# Patient Record
Sex: Male | Born: 1955 | Race: Black or African American | Hispanic: No | Marital: Married | Smoking: Former smoker
Health system: Southern US, Community
[De-identification: ages and names within clinical notes are randomized; demographics above are authoritative.]

## PROBLEM LIST (undated history)

## (undated) DIAGNOSIS — E119 Type 2 diabetes mellitus without complications: Secondary | ICD-10-CM

## (undated) DIAGNOSIS — C61 Malignant neoplasm of prostate: Secondary | ICD-10-CM

## (undated) DIAGNOSIS — I639 Cerebral infarction, unspecified: Secondary | ICD-10-CM

## (undated) DIAGNOSIS — I2510 Atherosclerotic heart disease of native coronary artery without angina pectoris: Secondary | ICD-10-CM

## (undated) DIAGNOSIS — E785 Hyperlipidemia, unspecified: Secondary | ICD-10-CM

## (undated) DIAGNOSIS — I10 Essential (primary) hypertension: Secondary | ICD-10-CM

## (undated) HISTORY — PX: PROSTATE BIOPSY: SHX241

## (undated) HISTORY — PX: CARDIAC CATHETERIZATION: SHX172

---

## 2019-01-13 ENCOUNTER — Encounter (HOSPITAL_COMMUNITY): Payer: Self-pay

## 2019-01-13 ENCOUNTER — Emergency Department (HOSPITAL_COMMUNITY): Payer: Medicare PPO

## 2019-01-13 ENCOUNTER — Inpatient Hospital Stay (HOSPITAL_COMMUNITY)
Admission: EM | Admit: 2019-01-13 | Discharge: 2019-01-16 | DRG: 065 | Disposition: A | Discharge status: Home / Self Care | Payer: Medicare PPO | Attending: Internal Medicine | Admitting: Internal Medicine

## 2019-01-13 DIAGNOSIS — E1151 Type 2 diabetes mellitus with diabetic peripheral angiopathy without gangrene: Secondary | ICD-10-CM | POA: Diagnosis present

## 2019-01-13 DIAGNOSIS — R29701 NIHSS score 1: Secondary | ICD-10-CM | POA: Diagnosis not present

## 2019-01-13 DIAGNOSIS — I4510 Unspecified right bundle-branch block: Secondary | ICD-10-CM | POA: Diagnosis present

## 2019-01-13 DIAGNOSIS — G8194 Hemiplegia, unspecified affecting left nondominant side: Secondary | ICD-10-CM | POA: Diagnosis present

## 2019-01-13 DIAGNOSIS — R29702 NIHSS score 2: Secondary | ICD-10-CM | POA: Diagnosis not present

## 2019-01-13 DIAGNOSIS — R739 Hyperglycemia, unspecified: Secondary | ICD-10-CM

## 2019-01-13 DIAGNOSIS — N179 Acute kidney failure, unspecified: Secondary | ICD-10-CM | POA: Diagnosis present

## 2019-01-13 DIAGNOSIS — Z79899 Other long term (current) drug therapy: Secondary | ICD-10-CM

## 2019-01-13 DIAGNOSIS — I639 Cerebral infarction, unspecified: Principal | ICD-10-CM | POA: Diagnosis present

## 2019-01-13 DIAGNOSIS — R4781 Slurred speech: Secondary | ICD-10-CM | POA: Diagnosis not present

## 2019-01-13 DIAGNOSIS — E1122 Type 2 diabetes mellitus with diabetic chronic kidney disease: Secondary | ICD-10-CM | POA: Diagnosis present

## 2019-01-13 DIAGNOSIS — G459 Transient cerebral ischemic attack, unspecified: Secondary | ICD-10-CM | POA: Diagnosis present

## 2019-01-13 DIAGNOSIS — E1165 Type 2 diabetes mellitus with hyperglycemia: Secondary | ICD-10-CM | POA: Diagnosis present

## 2019-01-13 DIAGNOSIS — N182 Chronic kidney disease, stage 2 (mild): Secondary | ICD-10-CM | POA: Diagnosis present

## 2019-01-13 DIAGNOSIS — Z20828 Contact with and (suspected) exposure to other viral communicable diseases: Secondary | ICD-10-CM | POA: Diagnosis present

## 2019-01-13 DIAGNOSIS — G319 Degenerative disease of nervous system, unspecified: Secondary | ICD-10-CM | POA: Diagnosis present

## 2019-01-13 DIAGNOSIS — R471 Dysarthria and anarthria: Secondary | ICD-10-CM | POA: Diagnosis present

## 2019-01-13 DIAGNOSIS — I129 Hypertensive chronic kidney disease with stage 1 through stage 4 chronic kidney disease, or unspecified chronic kidney disease: Secondary | ICD-10-CM | POA: Diagnosis present

## 2019-01-13 DIAGNOSIS — R001 Bradycardia, unspecified: Secondary | ICD-10-CM | POA: Diagnosis present

## 2019-01-13 DIAGNOSIS — N400 Enlarged prostate without lower urinary tract symptoms: Secondary | ICD-10-CM | POA: Diagnosis present

## 2019-01-13 DIAGNOSIS — I6350 Cerebral infarction due to unspecified occlusion or stenosis of unspecified cerebral artery: Secondary | ICD-10-CM

## 2019-01-13 DIAGNOSIS — E785 Hyperlipidemia, unspecified: Secondary | ICD-10-CM | POA: Diagnosis present

## 2019-01-13 DIAGNOSIS — Z7984 Long term (current) use of oral hypoglycemic drugs: Secondary | ICD-10-CM

## 2019-01-13 DIAGNOSIS — Z8673 Personal history of transient ischemic attack (TIA), and cerebral infarction without residual deficits: Secondary | ICD-10-CM

## 2019-01-13 HISTORY — DX: Essential (primary) hypertension: I10

## 2019-01-13 HISTORY — DX: Type 2 diabetes mellitus without complications: E11.9

## 2019-01-13 LAB — COMPREHENSIVE METABOLIC PANEL
ALT: 16 U/L (ref 0–44)
AST: 22 U/L (ref 15–41)
Albumin: 3.6 g/dL (ref 3.5–5.0)
Alkaline Phosphatase: 65 U/L (ref 38–126)
Anion gap: 9 (ref 5–15)
BUN: 14 mg/dL (ref 8–23)
CO2: 24 mmol/L (ref 22–32)
Calcium: 8.9 mg/dL (ref 8.9–10.3)
Chloride: 100 mmol/L (ref 98–111)
Creatinine, Ser: 1.42 mg/dL — ABNORMAL HIGH (ref 0.61–1.24)
GFR calc Af Amer: 60 mL/min — ABNORMAL LOW (ref >60)
GFR calc non Af Amer: 51 mL/min — ABNORMAL LOW (ref >60)
Glucose, Bld: 405 mg/dL — ABNORMAL HIGH (ref 70–99)
Potassium: 4.1 mmol/L (ref 3.5–5.1)
Sodium: 133 mmol/L — ABNORMAL LOW (ref 135–145)
Total Bilirubin: 1.1 mg/dL (ref 0.3–1.2)
Total Protein: 7 g/dL (ref 6.5–8.1)

## 2019-01-13 LAB — CBC
HCT: 38 % — ABNORMAL LOW (ref 39.0–52.0)
Hemoglobin: 13.3 g/dL (ref 13.0–17.0)
MCH: 31.2 pg (ref 26.0–34.0)
MCHC: 35 g/dL (ref 30.0–36.0)
MCV: 89.2 fL (ref 80.0–100.0)
Platelets: 176 10*3/uL (ref 150–400)
RBC: 4.26 MIL/uL (ref 4.22–5.81)
RDW: 12 % (ref 11.5–15.5)
WBC: 5.4 10*3/uL (ref 4.0–10.5)
nRBC: 0 % (ref 0.0–0.2)

## 2019-01-13 LAB — URINALYSIS, ROUTINE W REFLEX MICROSCOPIC
Bacteria, UA: NONE SEEN
Bilirubin Urine: NEGATIVE
Glucose, UA: >=500 mg/dL — AB
Hgb urine dipstick: NEGATIVE
Ketones, ur: NEGATIVE mg/dL
Leukocytes,Ua: NEGATIVE
Nitrite: NEGATIVE
Protein, ur: 30 mg/dL — AB
Specific Gravity, Urine: 1.028 (ref 1.005–1.030)
pH: 6 (ref 5.0–8.0)

## 2019-01-13 LAB — APTT: aPTT: 25 seconds (ref 24–36)

## 2019-01-13 LAB — DIFFERENTIAL
Abs Immature Granulocytes: 0.01 10*3/uL (ref 0.00–0.07)
Basophils Absolute: 0 10*3/uL (ref 0.0–0.1)
Basophils Relative: 0 %
Eosinophils Absolute: 0.1 10*3/uL (ref 0.0–0.5)
Eosinophils Relative: 1 %
Immature Granulocytes: 0 %
Lymphocytes Relative: 26 %
Lymphs Abs: 1.4 10*3/uL (ref 0.7–4.0)
Monocytes Absolute: 0.6 10*3/uL (ref 0.1–1.0)
Monocytes Relative: 11 %
Neutro Abs: 3.4 10*3/uL (ref 1.7–7.7)
Neutrophils Relative %: 62 %

## 2019-01-13 LAB — RAPID URINE DRUG SCREEN, HOSP PERFORMED
Amphetamines: NOT DETECTED
Barbiturates: NOT DETECTED
Benzodiazepines: NOT DETECTED
Cocaine: NOT DETECTED
Opiates: NOT DETECTED
Tetrahydrocannabinol: NOT DETECTED

## 2019-01-13 LAB — PROTIME-INR
INR: 0.9 (ref 0.8–1.2)
Prothrombin Time: 12.4 seconds (ref 11.4–15.2)

## 2019-01-13 LAB — ETHANOL: Alcohol, Ethyl (B): <10 mg/dL (ref <10)

## 2019-01-13 LAB — CBG MONITORING, ED
Glucose-Capillary: 399 mg/dL — ABNORMAL HIGH (ref 70–99)
Glucose-Capillary: 352 mg/dL — ABNORMAL HIGH (ref 70–99)

## 2019-01-13 IMAGING — CT CT HEAD W/O CM
3 series · 15 of 47 positions shown, 18 images · non-contrast
Comparison: None.

CLINICAL DATA: 63-year-old male with slurred speech and focal
neurologic deficit.

EXAM:
CT HEAD WITHOUT CONTRAST
TECHNIQUE: Contiguous axial images were obtained from the base of the skull
through the vertex without intravenous contrast.

[Series 2: head wo · axial · 0.44mm/px · z∈[+148,+273]mm · 9 of 31 slices shown, 12 images]
[im 3/31  brain]
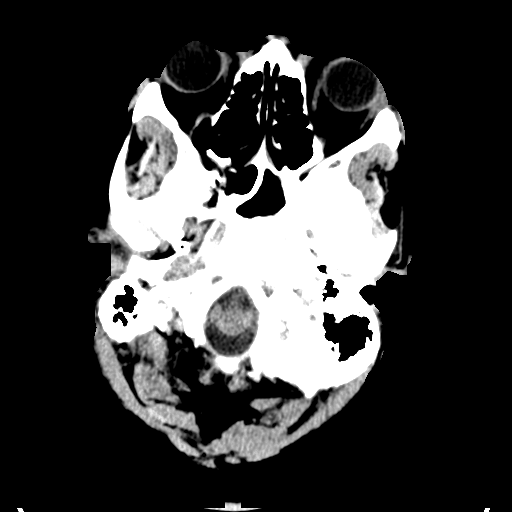
[im 3/31  bone]
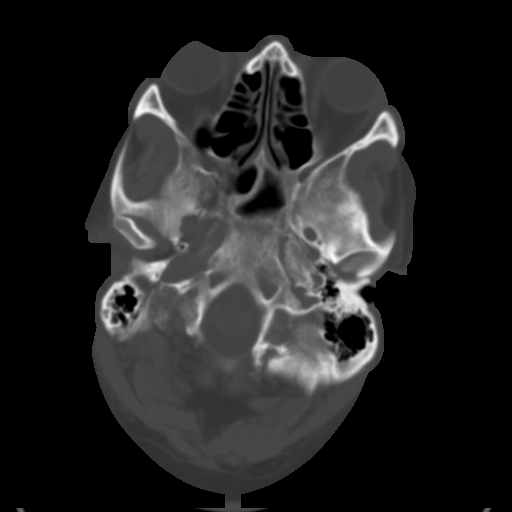
[im 6/31  brain]
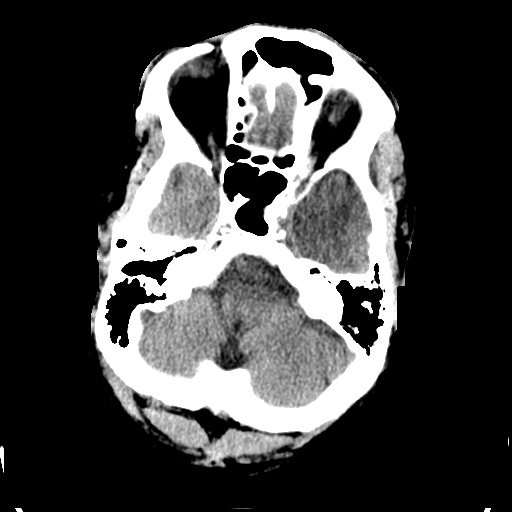
[im 9/31  brain]
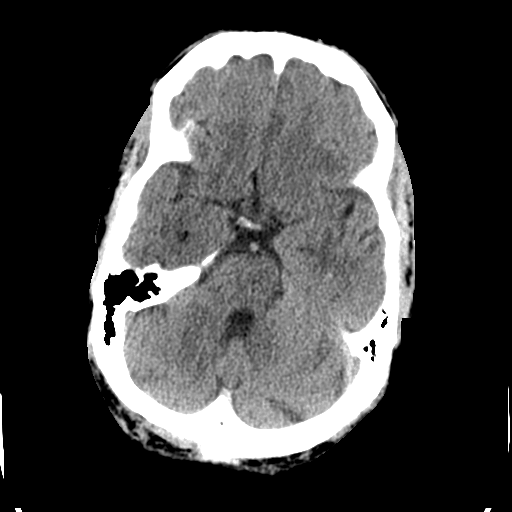
[im 12/31  brain]
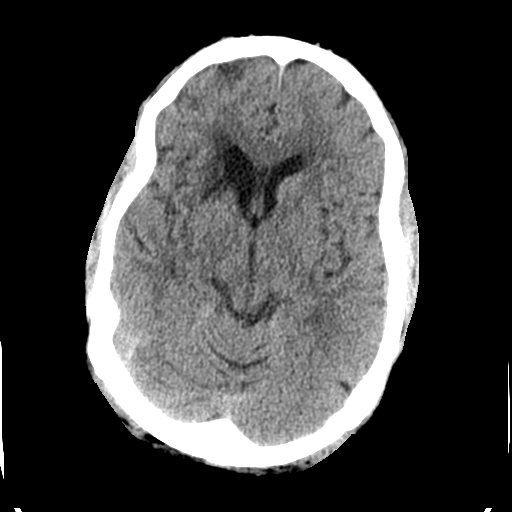
[im 16/31  brain]
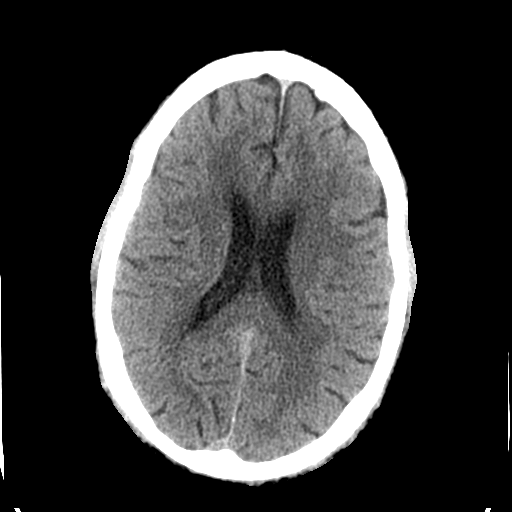
[im 16/31  bone]
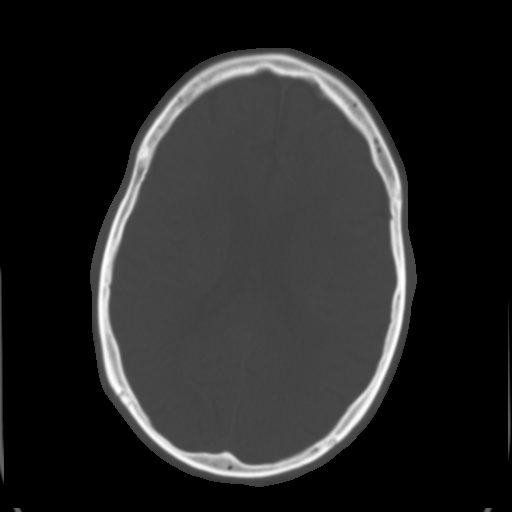
[im 19/31  brain]
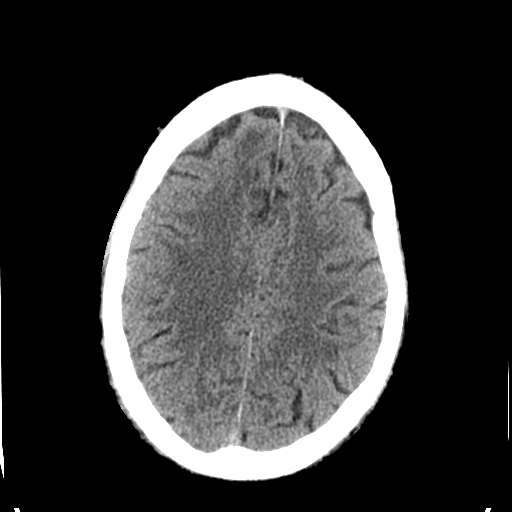
[im 22/31  brain]
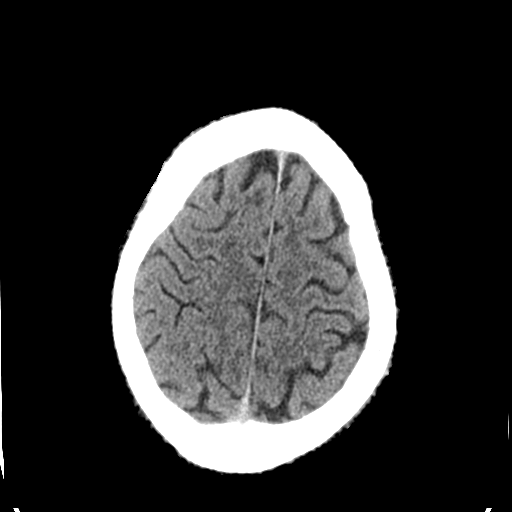
[im 25/31  brain]
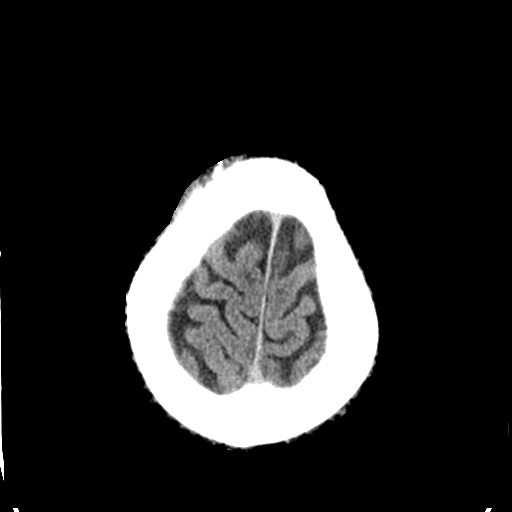
[im 28/31  brain]
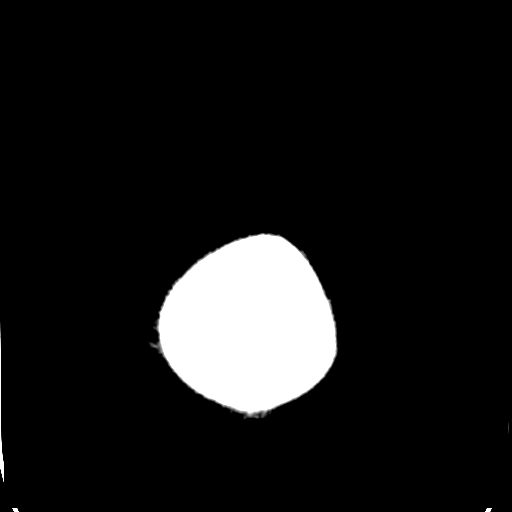
[im 28/31  bone]
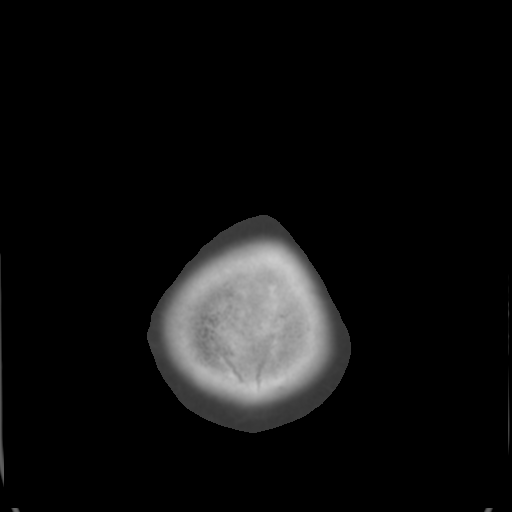

[Series 5: coronal soft tissue · coronal · 0.30mm/px · 3 of 72 slices shown]
[im 24/72  brain]
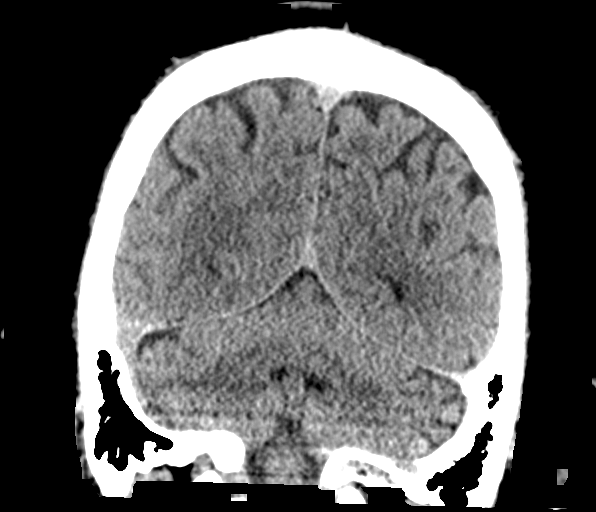
[im 32/72  brain]
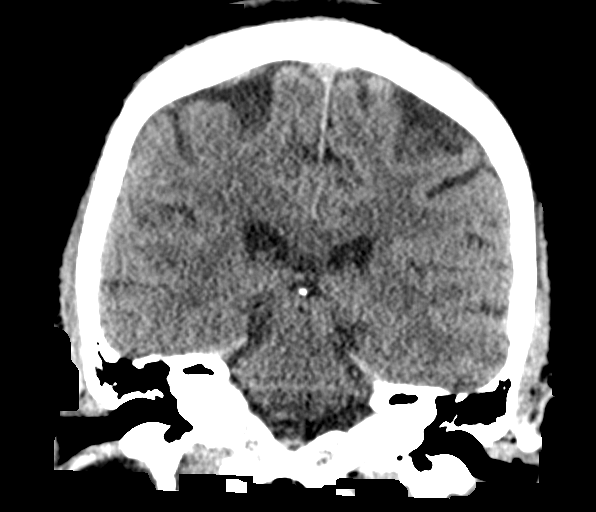
[im 40/72  brain]
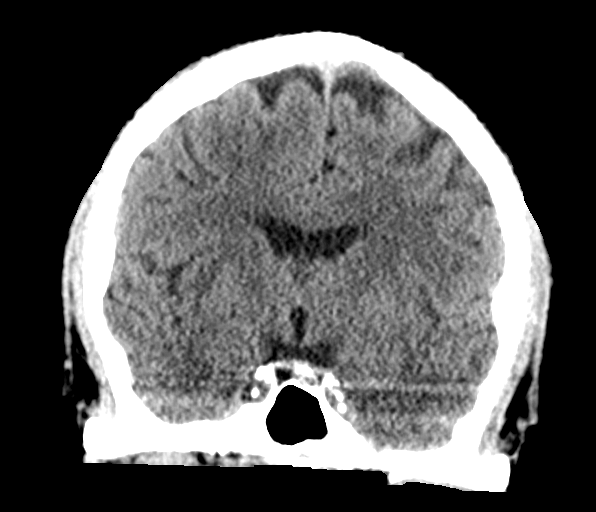

[Series 6: sagittal soft tissue · sagittal · 0.30mm/px · 3 of 60 slices shown]
[im 20/60  brain]
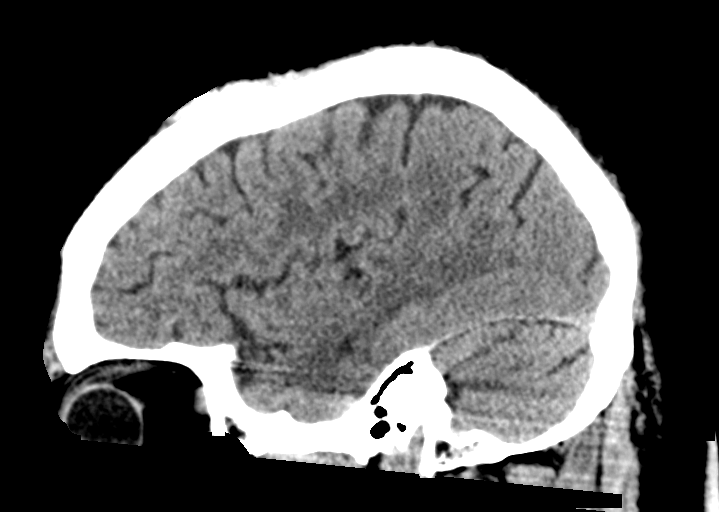
[im 30/60  brain]
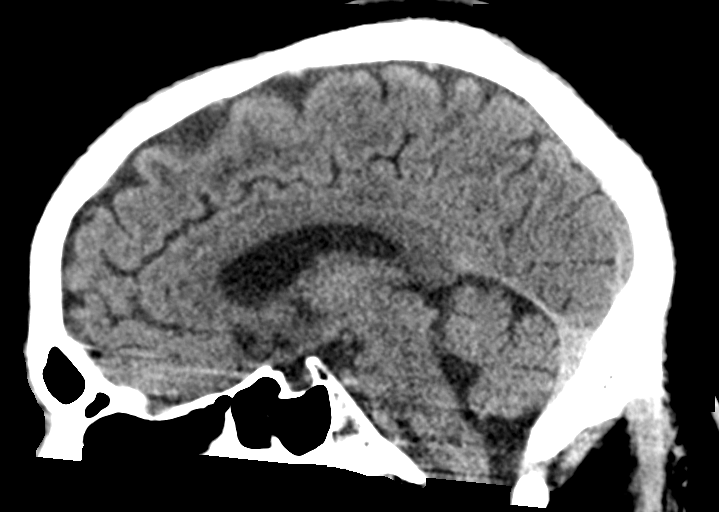
[im 40/60  brain]
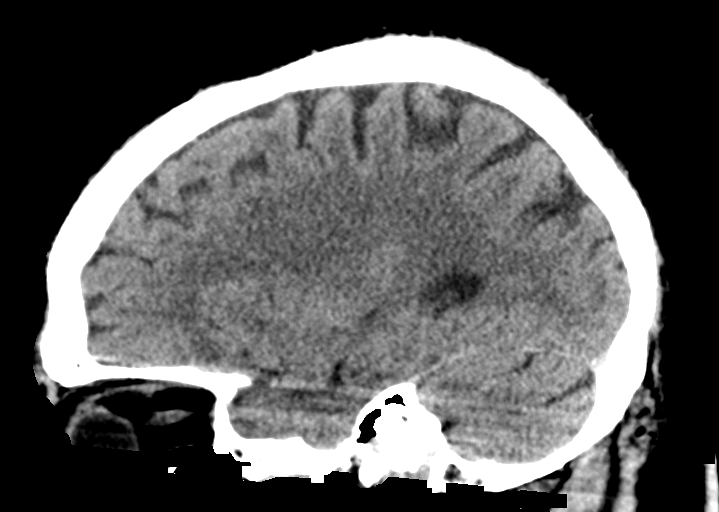

[15 of 47 positions shown; findings below may reference images not displayed]

FINDINGS: Brain: There is mild age-related atrophy and chronic microvascular
ischemic changes. Right basal ganglia old infarct noted. There is no
acute intracranial hemorrhage. No mass effect or midline shift. No
extra-axial fluid collection.

Vascular: No hyperdense vessel or unexpected calcification.

Skull: Normal. Negative for fracture or focal lesion.

Sinuses/Orbits: No acute finding.

Other: Right frontal scalp scarring.
IMPRESSION: 1. No acute intracranial hemorrhage.
2. Mild age-related atrophy and chronic microvascular ischemic
changes. Old right basal ganglia infarct.

## 2019-01-13 MED ORDER — SODIUM CHLORIDE 0.9 % IV SOLN
1000.0000 mL | INTRAVENOUS | Status: DC
  Administered 2019-01-13 – 2019-01-15 (×4): 1000 mL via INTRAVENOUS

## 2019-01-13 MED ORDER — SODIUM CHLORIDE 0.9 % IV BOLUS (SEPSIS)
1000.0000 mL | Freq: Once | INTRAVENOUS | Status: AC
  Administered 2019-01-13: 1000 mL via INTRAVENOUS

## 2019-01-13 MED ORDER — ACETAMINOPHEN 325 MG PO TABS
650.0000 mg | ORAL_TABLET | Freq: Once | ORAL | Status: DC
  Filled 2019-01-13: qty 2

## 2019-01-13 NOTE — ED Provider Notes (Signed)
[facility] DEPT Provider Note   CSN: [id number] Arrival date & time: 01/13/19  2017     History   Chief Complaint Chief Complaint  Patient presents with  . Hyperglycemia    HPI Jerry Moran is a 63 y.o. male.     HPI Patient presents to the ED for evaluation of an episode of slurred speech and lightheadedness.  Patient states the symptoms lasted for about an hour or so earlier this evening.  Patient denies any focal numbness or weakness.  He did not have any trouble with headache.  Patient called EMS and when they evaluated patient they did not see any neurologic abnormalities.  Patient was noted to have very elevated blood sugar.  Patient states he has taken some of his medications today but not all of them.  Patient states he is feeling better now and his symptoms have resolved. Past Medical History:  Diagnosis Date  . Diabetes mellitus without complication ([place])   . Hypertension     There are no active problems to display for this patient.   History reviewed. No pertinent surgical history.      Home Medications    Prior to Admission medications   Medication Sig Start Date End Date Taking? Authorizing Provider  amLODipine (NORVASC) 10 MG tablet Take 10 mg by mouth daily.   Yes [provider]  lisinopril (ZESTRIL) 20 MG tablet Take 20 mg by mouth daily.   Yes [provider]  metFORMIN (GLUCOPHAGE) 500 MG tablet Take 500 mg by mouth 4 (four) times daily.   Yes [provider]  tamsulosin (FLOMAX) 0.4 MG CAPS capsule Take 0.4 mg by mouth daily.   Yes [provider]    Family History History reviewed. No pertinent family history.  Social History Social History   Tobacco Use  . Smoking status: Never Smoker  . Smokeless tobacco: Never Used  Substance Use Topics  . Alcohol use: Never    Frequency: Never  . Drug use: Never     Allergies   Patient has no known allergies.   Review  of Systems Review of Systems  All other systems reviewed and are negative.    Physical Exam Updated Vital Signs BP (!) 146/92   Pulse 63   Temp 98.1 F (36.7 C) (Oral)   Resp 17   Ht 1.753 m (5\' 9" )   Wt 81.6 kg   SpO2 99%   BMI 26.58 kg/m   Physical Exam Vitals signs and nursing note reviewed.  Constitutional:      General: He is not in acute distress.    Appearance: He is well-developed.  HENT:     Head: Normocephalic and atraumatic.     Right Ear: External ear normal.     Left Ear: External ear normal.  Eyes:     General: No scleral icterus.       Right eye: No discharge.        Left eye: No discharge.     Conjunctiva/sclera: Conjunctivae normal.  Neck:     Musculoskeletal: Neck supple.     Trachea: No tracheal deviation.  Cardiovascular:     Rate and Rhythm: Normal rate and regular rhythm.  Pulmonary:     Effort: Pulmonary effort is normal. No respiratory distress.     Breath sounds: Normal breath sounds. No stridor. No wheezing or rales.  Abdominal:     General: Bowel sounds are normal. There is no distension.     Palpations: Abdomen  is soft.     Tenderness: There is no abdominal tenderness. There is no guarding or rebound.  Musculoskeletal:        General: No tenderness.  Skin:    General: Skin is warm and dry.     Findings: No rash.  Neurological:     Mental Status: He is alert and oriented to person, place, and time.     Cranial Nerves: No cranial nerve deficit (No facial droop, extraocular movements intact, tongue midline ).     Sensory: No sensory deficit.     Motor: No abnormal muscle tone or seizure activity.     Coordination: Coordination normal.     Comments: No pronator drift bilateral upper extrem, able to hold both legs off bed for 5 seconds, sensation intact in all extremities, no visual field cuts, no left or right sided neglect, normal finger-nose exam bilaterally, no nystagmus noted       ED Treatments / Results  Labs (all labs  ordered are listed, but only abnormal results are displayed) Labs Reviewed  CBC - Abnormal; Notable for the following components:      Result Value   HCT 38.0 (*)    All other components within normal limits  COMPREHENSIVE METABOLIC PANEL - Abnormal; Notable for the following components:   Sodium 133 (*)    Glucose, Bld 405 (*)    Creatinine, Ser 1.42 (*)    GFR calc non Af Amer 51 (*)    GFR calc Af Amer 60 (*)    All other components within normal limits  URINALYSIS, ROUTINE W REFLEX MICROSCOPIC - Abnormal; Notable for the following components:   Color, Urine STRAW (*)    Glucose, UA >=500 (*)    Protein, ur 30 (*)    All other components within normal limits  CBG MONITORING, ED - Abnormal; Notable for the following components:   Glucose-Capillary 399 (*)    All other components within normal limits  CBG MONITORING, ED - Abnormal; Notable for the following components:   Glucose-Capillary 352 (*)    All other components within normal limits  SARS CORONAVIRUS 2 (TAT 6-24 HRS)  ETHANOL  PROTIME-INR  APTT  DIFFERENTIAL  RAPID URINE DRUG SCREEN, HOSP PERFORMED    EKG EKG Interpretation  Date/Time:  Monday January 13 2019 21:16:09 EDT Ventricular Rate:  61 PR Interval:    QRS Duration: 127 QT Interval:  422 QTC Calculation: 426 R Axis:   82 Text Interpretation:  Sinus rhythm Right bundle branch block No old tracing to compare Confirmed by Dorie Rank [phone]) on 01/13/2019 10:04:54 PM   Radiology Ct Head Wo Contrast  Result Date: 01/13/2019 CLINICAL DATA:  63 year old male with slurred speech and focal neurologic deficit. EXAM: CT HEAD WITHOUT CONTRAST TECHNIQUE: Contiguous axial images were obtained from the base of the skull through the vertex without intravenous contrast. COMPARISON:  None. FINDINGS: Brain: There is mild age-related atrophy and chronic microvascular ischemic changes. 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 There is no acute intracranial hemorrhage. No  mass effect or midline shift. No extra-axial fluid collection. Vascular: No hyperdense vessel or unexpected calcification. Skull: Normal. Negative for fracture or focal lesion. Sinuses/Orbits: No acute finding. Other: Right frontal scalp scarring. IMPRESSION: 1. No acute intracranial hemorrhage. 2. Mild age-related atrophy and chronic microvascular ischemic changes. Old right basal ganglia infarct. Electronically Signed   By: Anner Crete M.D.   On: 01/13/2019 21:25    Procedures Procedures (including critical care time)  Medications Ordered in  ED Medications  sodium chloride 0.9 % bolus 1,000 mL (0 mLs Intravenous Stopped 01/13/19 2231)    Followed by  0.9 %  sodium chloride infusion (1,000 mLs Intravenous New Bag/Given 01/13/19 2234)  acetaminophen (TYLENOL) tablet 650 mg (has no administration in time range)     Initial Impression / Assessment and Plan / ED Course  I have reviewed the triage vital signs and the nursing notes.  Pertinent labs & imaging results that were available during my care of the patient were reviewed by me and considered in my medical decision making (see chart for details).  Clinical Course as of Jan 13 2332  Mon Jan 13, 2019  2256 Discussed findings with patient and wife.  Wife does not feel speech is quite  back to normal.  Will admit, consult   [JK]  2315 D/w Dr Leonel Ramsay, pt will need MRI.  Not available at [facility] at this time of night.  [facility] need [facility] to [facility]   [JK]    Clinical Course User Index [JK] Dorie Rank, MD   Patient presented to the ED for an episode of difficulty speaking.  Symptoms resolved prior to arrival.  On my exam the patient has no focal neurologic abnormalities.  Blood sugar is elevated however I do not think it severe enough to cause difficulty with his speech.  CT scan shows old findings but no acute stroke.  ABCD2 =5.  Moderate risk score.  WIll consult for admission, TIA workup.  Final Clinical Impressions(s) / ED  Diagnoses   Final diagnoses:  TIA (transient ischemic attack)  Hyperglycemia    ED Discharge Orders    None       Dorie Rank, MD 01/13/19 2337

## 2019-01-13 NOTE — ED Notes (Signed)
Patient transported to CT via stretcher.

## 2019-01-13 NOTE — ED Notes (Signed)
Pt ambulated to the bathroom without assistance. Gait steady. No complaints of dizziness or shortness of breath

## 2019-01-13 NOTE — ED Triage Notes (Signed)
EMS was called by pt's wife for slurred speech, when EMS arrived he was A&O x 4, no pain, no slurred speech, no drift and no facial droop Pt is noncompliant with his diabetic meds Pt is also hypertensive

## 2019-01-14 ENCOUNTER — Inpatient Hospital Stay (HOSPITAL_COMMUNITY): Payer: Medicare PPO

## 2019-01-14 ENCOUNTER — Other Ambulatory Visit: Payer: Self-pay

## 2019-01-14 ENCOUNTER — Observation Stay (HOSPITAL_COMMUNITY): Payer: Medicare PPO

## 2019-01-14 DIAGNOSIS — E1169 Type 2 diabetes mellitus with other specified complication: Secondary | ICD-10-CM

## 2019-01-14 DIAGNOSIS — N182 Chronic kidney disease, stage 2 (mild): Secondary | ICD-10-CM | POA: Diagnosis present

## 2019-01-14 DIAGNOSIS — E785 Hyperlipidemia, unspecified: Secondary | ICD-10-CM | POA: Diagnosis present

## 2019-01-14 DIAGNOSIS — R29702 NIHSS score 2: Secondary | ICD-10-CM | POA: Diagnosis not present

## 2019-01-14 DIAGNOSIS — E1151 Type 2 diabetes mellitus with diabetic peripheral angiopathy without gangrene: Secondary | ICD-10-CM | POA: Diagnosis present

## 2019-01-14 DIAGNOSIS — Z79899 Other long term (current) drug therapy: Secondary | ICD-10-CM | POA: Diagnosis not present

## 2019-01-14 DIAGNOSIS — I639 Cerebral infarction, unspecified: Secondary | ICD-10-CM | POA: Diagnosis present

## 2019-01-14 DIAGNOSIS — G459 Transient cerebral ischemic attack, unspecified: Secondary | ICD-10-CM | POA: Diagnosis not present

## 2019-01-14 DIAGNOSIS — N179 Acute kidney failure, unspecified: Secondary | ICD-10-CM | POA: Diagnosis present

## 2019-01-14 DIAGNOSIS — G319 Degenerative disease of nervous system, unspecified: Secondary | ICD-10-CM | POA: Diagnosis present

## 2019-01-14 DIAGNOSIS — Z20828 Contact with and (suspected) exposure to other viral communicable diseases: Secondary | ICD-10-CM | POA: Diagnosis present

## 2019-01-14 DIAGNOSIS — I4510 Unspecified right bundle-branch block: Secondary | ICD-10-CM | POA: Diagnosis present

## 2019-01-14 DIAGNOSIS — R471 Dysarthria and anarthria: Secondary | ICD-10-CM | POA: Diagnosis present

## 2019-01-14 DIAGNOSIS — R001 Bradycardia, unspecified: Secondary | ICD-10-CM

## 2019-01-14 DIAGNOSIS — E1165 Type 2 diabetes mellitus with hyperglycemia: Secondary | ICD-10-CM | POA: Diagnosis present

## 2019-01-14 DIAGNOSIS — R4781 Slurred speech: Secondary | ICD-10-CM | POA: Diagnosis present

## 2019-01-14 DIAGNOSIS — E1122 Type 2 diabetes mellitus with diabetic chronic kidney disease: Secondary | ICD-10-CM | POA: Diagnosis present

## 2019-01-14 DIAGNOSIS — G8194 Hemiplegia, unspecified affecting left nondominant side: Secondary | ICD-10-CM | POA: Diagnosis present

## 2019-01-14 DIAGNOSIS — N400 Enlarged prostate without lower urinary tract symptoms: Secondary | ICD-10-CM | POA: Diagnosis present

## 2019-01-14 DIAGNOSIS — Z7984 Long term (current) use of oral hypoglycemic drugs: Secondary | ICD-10-CM | POA: Diagnosis not present

## 2019-01-14 DIAGNOSIS — R29701 NIHSS score 1: Secondary | ICD-10-CM | POA: Diagnosis not present

## 2019-01-14 DIAGNOSIS — Z8673 Personal history of transient ischemic attack (TIA), and cerebral infarction without residual deficits: Secondary | ICD-10-CM | POA: Diagnosis not present

## 2019-01-14 DIAGNOSIS — I129 Hypertensive chronic kidney disease with stage 1 through stage 4 chronic kidney disease, or unspecified chronic kidney disease: Secondary | ICD-10-CM | POA: Diagnosis present

## 2019-01-14 LAB — HIV ANTIBODY (ROUTINE TESTING W REFLEX): HIV Screen 4th Generation wRfx: NONREACTIVE

## 2019-01-14 LAB — LIPID PANEL
Cholesterol: 252 mg/dL — ABNORMAL HIGH (ref 0–200)
HDL: 35 mg/dL — ABNORMAL LOW (ref >40)
LDL Cholesterol: 165 mg/dL — ABNORMAL HIGH (ref 0–99)
Total CHOL/HDL Ratio: 7.2 RATIO
Triglycerides: 258 mg/dL — ABNORMAL HIGH (ref <150)
VLDL: 52 mg/dL — ABNORMAL HIGH (ref 0–40)

## 2019-01-14 LAB — GLUCOSE, CAPILLARY: Glucose-Capillary: 280 mg/dL — ABNORMAL HIGH (ref 70–99)

## 2019-01-14 LAB — ECHOCARDIOGRAM COMPLETE
Height: 69 in
Weight: 2880 oz

## 2019-01-14 LAB — CBG MONITORING, ED
Glucose-Capillary: 213 mg/dL — ABNORMAL HIGH (ref 70–99)
Glucose-Capillary: 196 mg/dL — ABNORMAL HIGH (ref 70–99)

## 2019-01-14 LAB — SARS CORONAVIRUS 2 BY RT PCR (HOSPITAL ORDER, PERFORMED IN ~~LOC~~ HOSPITAL LAB): SARS Coronavirus 2: NEGATIVE

## 2019-01-14 LAB — HEMOGLOBIN A1C
Hgb A1c MFr Bld: 11.5 % — ABNORMAL HIGH (ref 4.8–5.6)
Mean Plasma Glucose: 283.35 mg/dL

## 2019-01-14 IMAGING — MR MR MRA HEAD W/O CM
13 of 15 series · 30 of 48 positions shown · non-contrast
Comparison: Head CT [DATE]

CLINICAL DATA: History of hypertension and diabetes. Acute
presentation with slurred speech and dizziness.



[Series 3: DWI · axial · 3.0mm · 1.09mm/px · z∈[-25,+113]mm · 5 of 94 slices shown (1 of 6)]
[im 1/94]
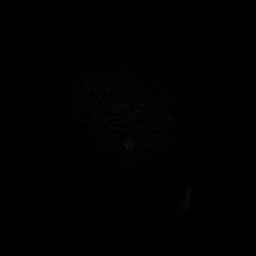
[im 24/94]
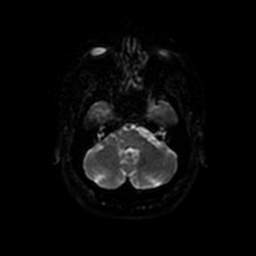
[im 47/94]
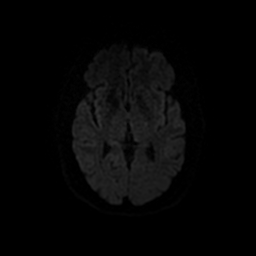
[im 70/94]
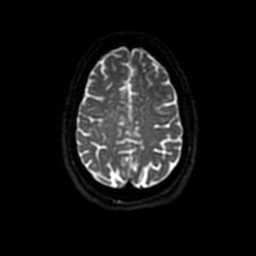
[im 94/94]
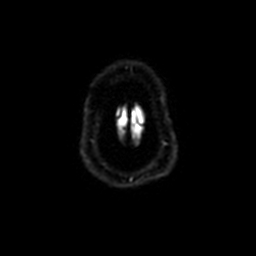

[Series 4: (id) mt fs · axial · 1.0mm · 0.47mm/px · 1 of 230 slices shown]
[im 1/230]
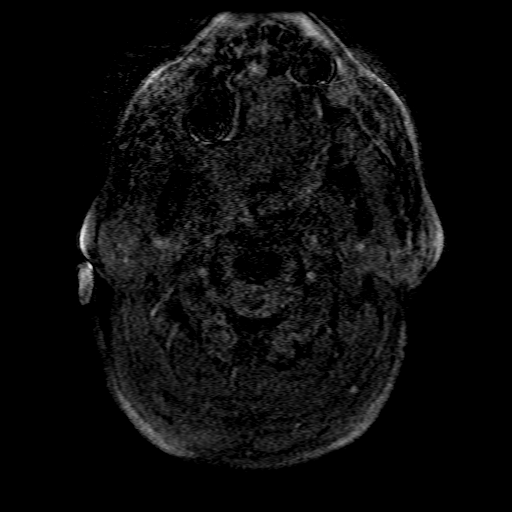

[Series 5: DWI · axial · 2.0mm · 1.09mm/px · z∈[-39,+29]mm · 3 of 70 slices shown (2 of 6)]
[im 1/70]
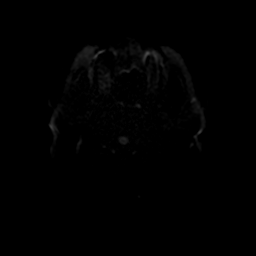
[im 35/70]
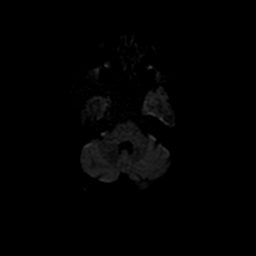
[im 70/70]
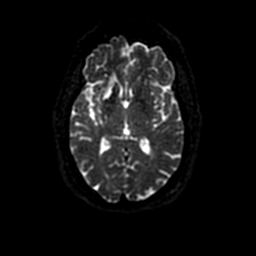

[Series 6: T2 · axial · 5.0mm · 0.43mm/px · 1 of 21 slices shown (1 of 3)]
[im 1/21]
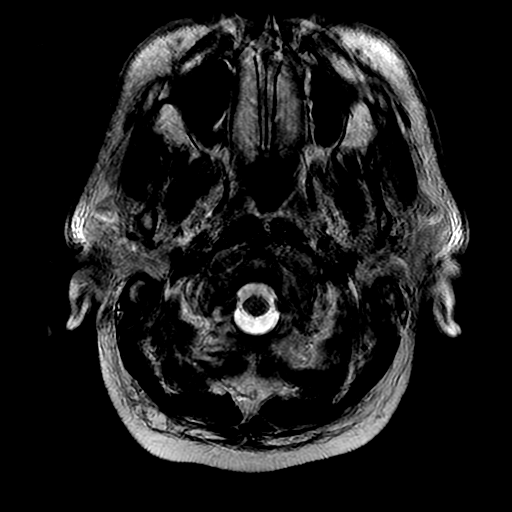

[Series 8: T1 · axial · 1.0mm · 0.47mm/px · z∈[-12,+124]mm · 6 of 138 slices shown (1 of 2)]
[im 1/138]
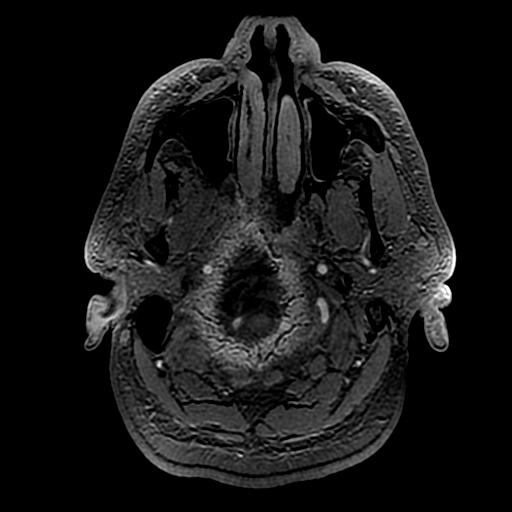
[im 28/138]
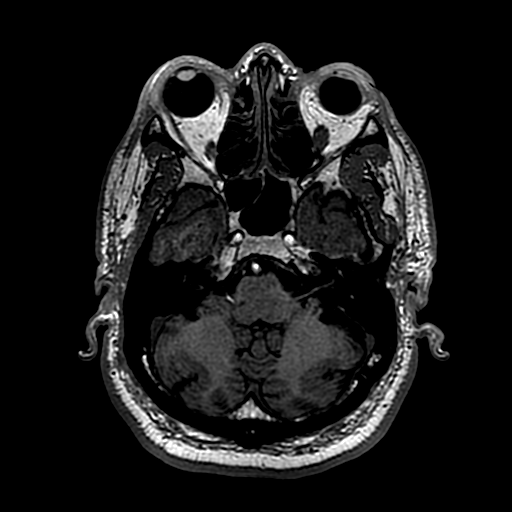
[im 55/138]
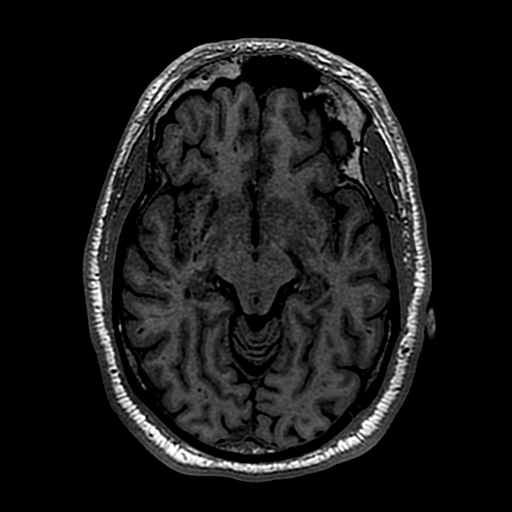
[im 83/138]
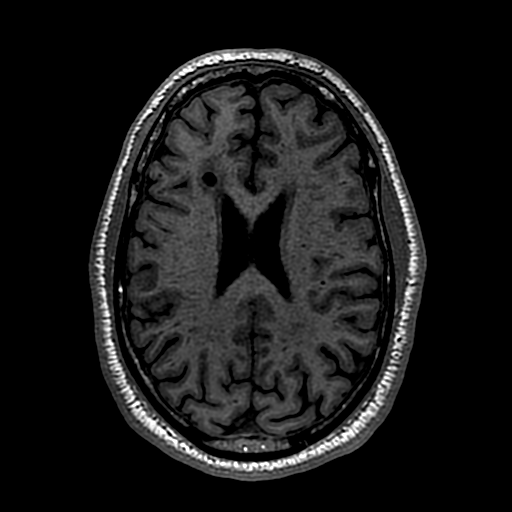
[im 110/138]
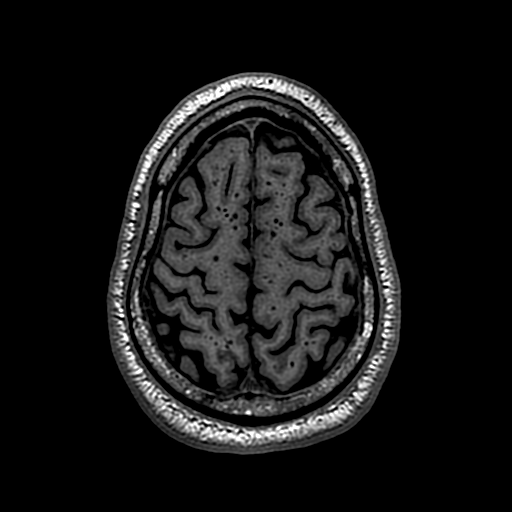
[im 138/138]
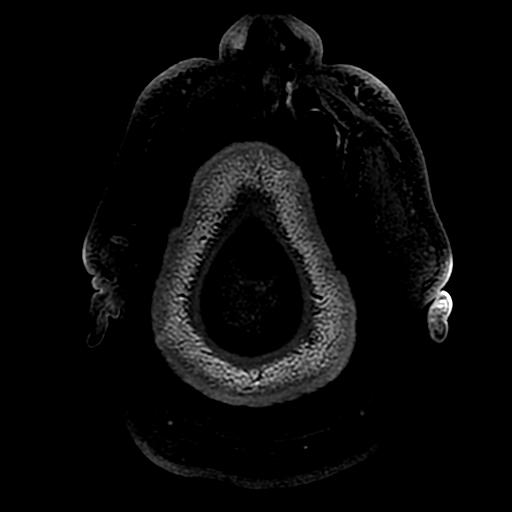

[Series 9: FLAIR · axial · 3.0mm · 0.43mm/px · 1 of 25 slices shown]
[im 1/25]
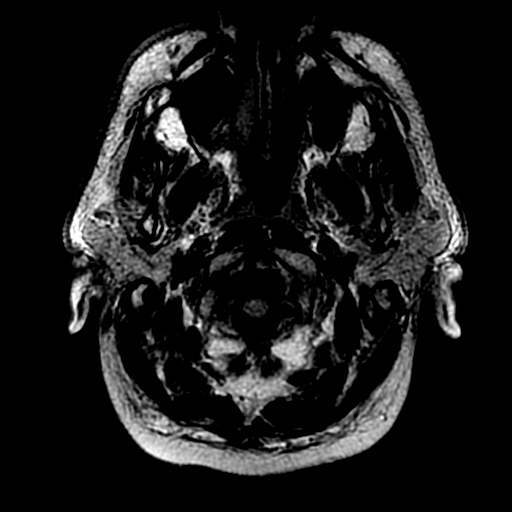

[Series 10: T2 · axial · 5.0mm · 0.43mm/px · 1 of 24 slices shown (2 of 3)]
[im 1/24]
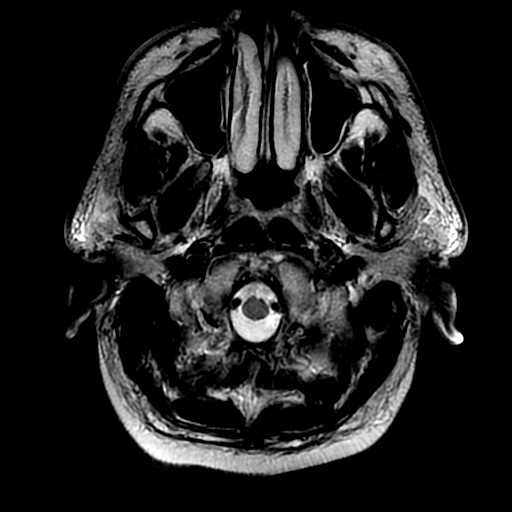

[Series 11: T1 · sagittal · 5.0mm · 0.47mm/px · 1 of 20 slices shown (2 of 2)]
[im 1/20]
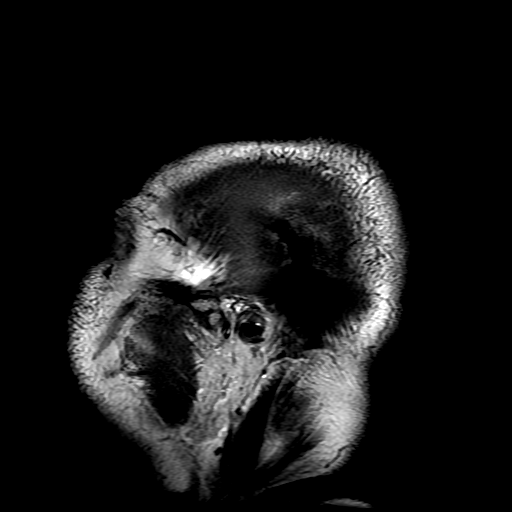

[Series 12: DWI · coronal · 4.0mm · 1.09mm/px · 4 of 84 slices shown (3 of 6)]
[im 1/84]
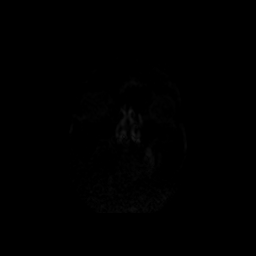
[im 28/84]
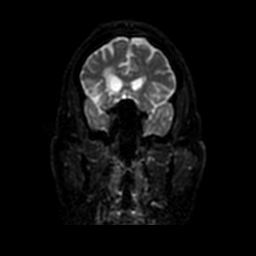
[im 56/84]
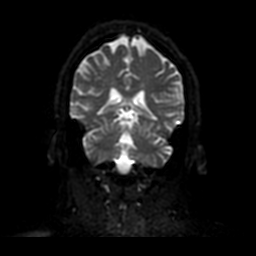
[im 84/84]
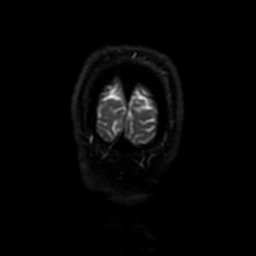

[Series 13: T2 · coronal · 5.0mm · 0.45mm/px · 1 of 26 slices shown (3 of 3)]
[im 1/26]
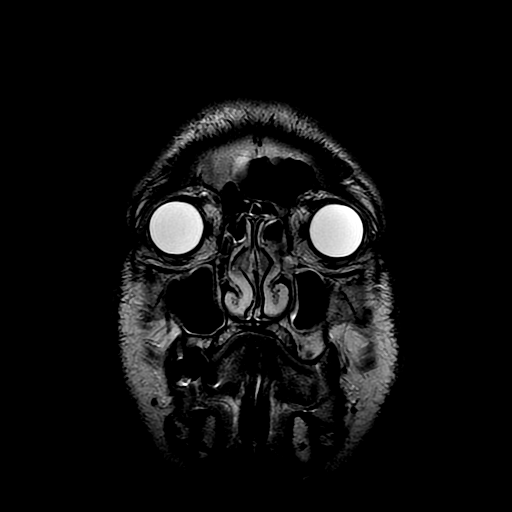

[Series 300: DWI · axial · 3.0mm · 1.09mm/px · z∈[-25,+113]mm · 2 of 47 slices shown (4 of 6)]
[im 1/47]
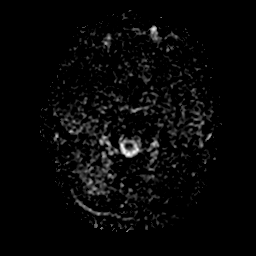
[im 47/47]
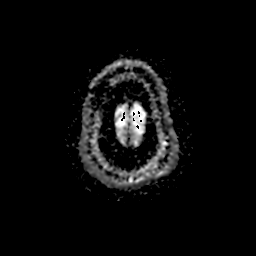

[Series 500: DWI · axial · 2.0mm · 1.09mm/px · z∈[-39,+29]mm · 2 of 35 slices shown (5 of 6)]
[im 1/35]
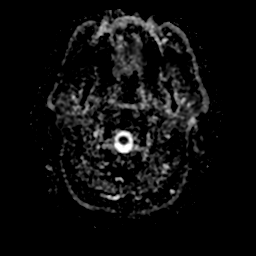
[im 35/35]
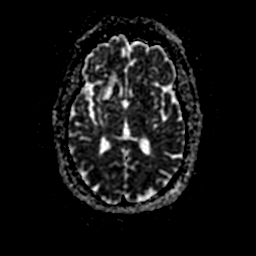

[Series 1200: DWI · coronal · 4.0mm · 1.09mm/px · 2 of 42 slices shown (6 of 6)]
[im 1/42]
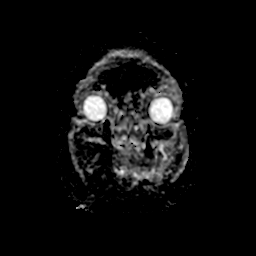
[im 42/42]
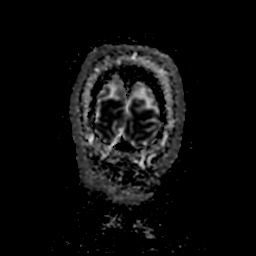

[30 of 48 positions shown; findings below may reference images not displayed]

FINDINGS: MRI HEAD FINDINGS

Brain: Diffusion imaging shows a 9 mm acute infarction within the
left para median pons. No other acute or subacute infarction.
Brainstem otherwise shows an old small vessel infarction in the
right para median pons. No focal cerebellar insult. Old small vessel
infarction in the right thalamus. Cerebral hemispheres show old
infarction in the right basal ganglia and in the white matter
adjacent to the frontal horn of the right lateral ventricle. There
are moderate chronic small-vessel ischemic changes diffusely
affecting the hemispheric deep white matter. No large vessel
territory infarction. No mass lesion, hemorrhage, hydrocephalus or
extra-axial collection.

Vascular: Major vessels at the base of the brain show flow.

Skull and upper cervical spine: Negative

Sinuses/Orbits: Clear/normal

Other: None

MRA HEAD FINDINGS

Both internal carotid arteries are patent through the skull base and
siphon regions. No flow limiting siphon stenosis. The anterior and
middle cerebral vessels are patent. There is a severe focal stenosis
1 of the 2 proximal left M2 branches. Distal vessels show some
atherosclerotic irregularity.

Both vertebral arteries are widely patent to the basilar. The
basilar artery shows atherosclerotic irregularity but no flow
limiting stenosis. Superior cerebellar and posterior cerebral
arteries show flow. Pronounced atherosclerotic irregularity of the
PCA branches, including a 50% or greater stenosis at the right P1 P2
junction.

MRA NECK FINDINGS

Antegrade flow in both common carotid arteries to the bifurcation
regions. Allowing for limitations of the noncontrast technique, both
carotid bifurcations appear normal. No suspected stenosis. Antegrade
flow in both vertebral arteries which are approximately equal in
size.
IMPRESSION: Acute 9 mm infarction affecting the left para median pons.

Old small vessel ischemic changes of the pons, right thalamus, right
basal ganglia and throughout the cerebral hemispheric white matter.

Negative noncontrast MR angiography of the neck vessels. No evidence
of carotid bifurcation disease. Antegrade flow in both vertebral
arteries.

No intracranial large or medium vessel occlusion. Severe stenosis of
the origin of 1 the 2 left M2 MCA branches. Moderate to severe
stenosis of the right posterior cerebral artery at the P1 P2
junction. Atherosclerotic irregularity of the more distal branch
vessels diffusely.

## 2019-01-14 IMAGING — MR MR MRA NECK W/O CM
22 of 29 series · 29 of 48 positions shown · non-contrast
Comparison: Head CT [DATE]

CLINICAL DATA: History of hypertension and diabetes. Acute
presentation with slurred speech and dizziness.



[Series 3: DWI · axial · 3.0mm · 1.09mm/px · 1 of 94 slices shown (1 of 12)]
[im 1/94]
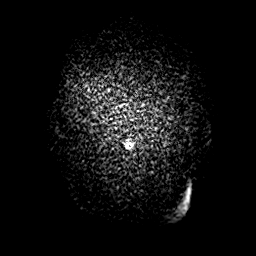

[Series 3: DWI · axial · 3.0mm · 1.09mm/px · 1 of 94 slices shown (2 of 12)]
[im 1/94]
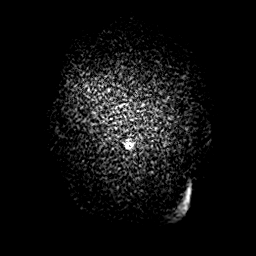

[Series 5: DWI · axial · 2.0mm · 1.09mm/px · z∈[-39,+29]mm · 2 of 70 slices shown (3 of 12)]
[im 1/70]
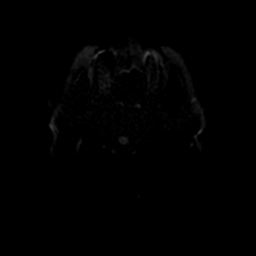
[im 70/70]
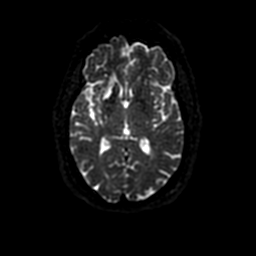

[Series 5: DWI · axial · 2.0mm · 1.09mm/px · z∈[-39,+29]mm · 2 of 70 slices shown (4 of 12)]
[im 1/70]
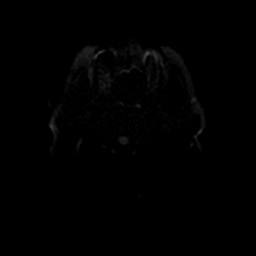
[im 70/70]
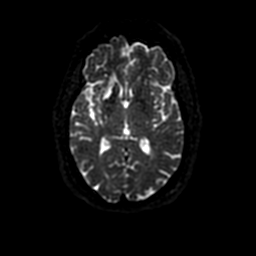

[Series 6: T2 · axial · 5.0mm · 0.43mm/px · 1 of 21 slices shown (1 of 6)]
[im 1/21]
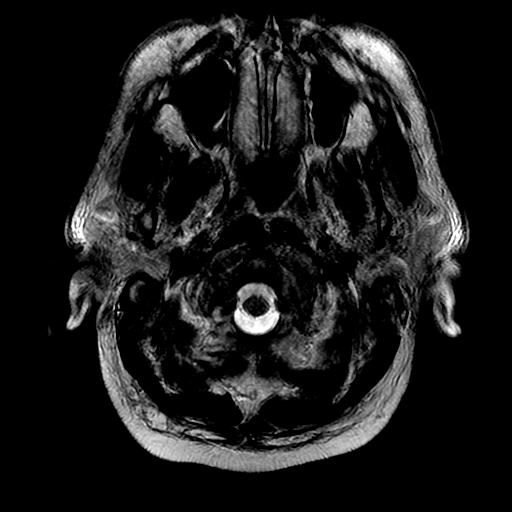

[Series 6: T2 · axial · 5.0mm · 0.43mm/px · 1 of 21 slices shown (2 of 6)]
[im 1/21]
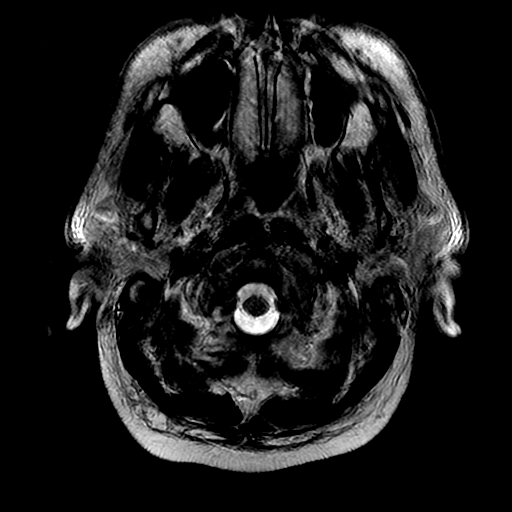

[Series 8: T1 · axial · 1.0mm · 0.47mm/px · z∈[-12,+124]mm · 3 of 138 slices shown (1 of 2)]
[im 1/138]
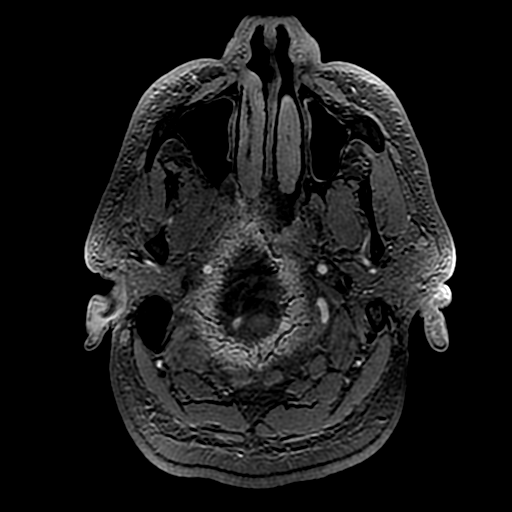
[im 69/138]
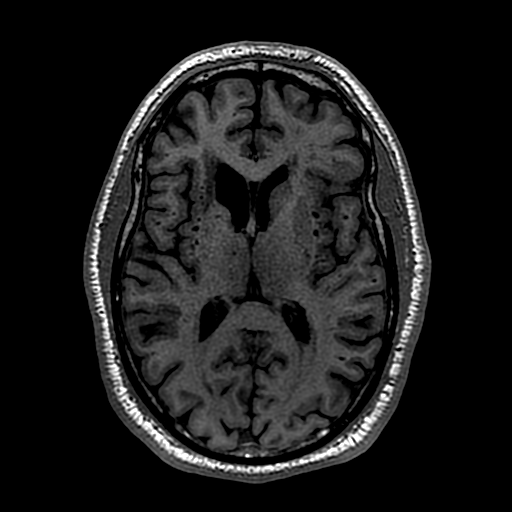
[im 138/138]
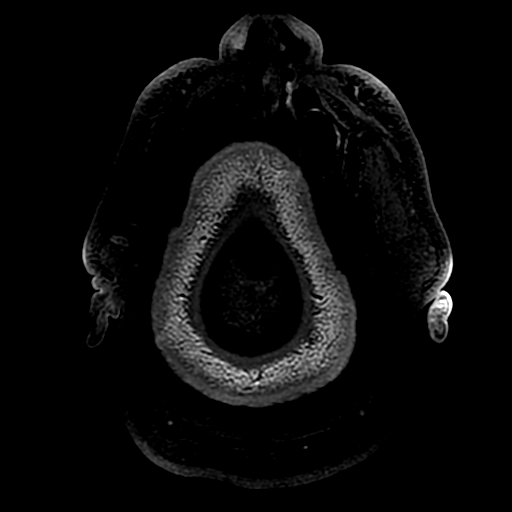

[Series 8: T1 · axial · 1.0mm · 0.47mm/px · z∈[-12,+55]mm · 2 of 138 slices shown (2 of 2)]
[im 1/138]
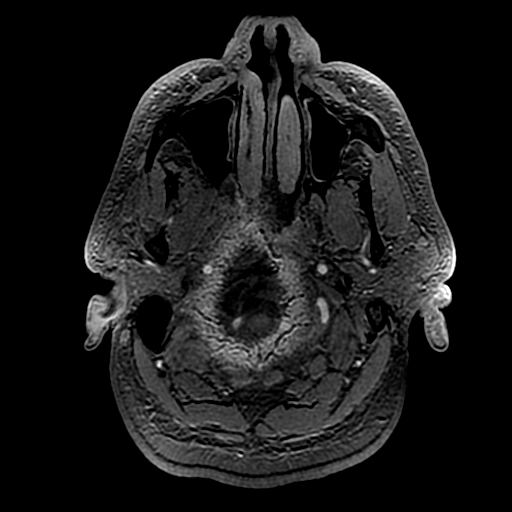
[im 69/138]
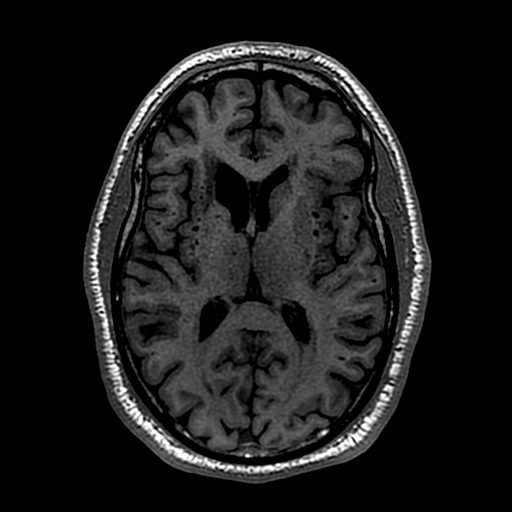

[Series 9: FLAIR · axial · 3.0mm · 0.43mm/px · 1 of 25 slices shown (1 of 2)]
[im 1/25]
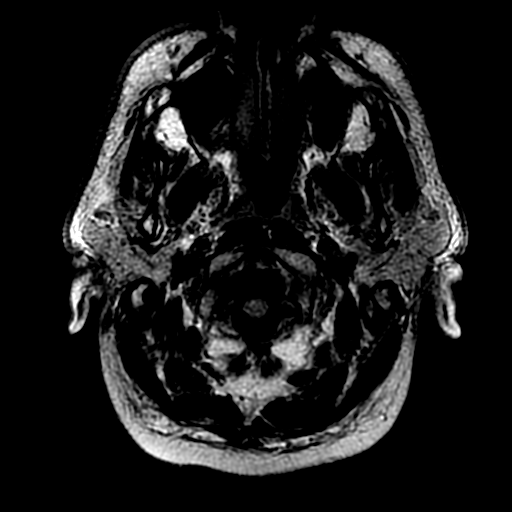

[Series 9: FLAIR · axial · 3.0mm · 0.43mm/px · 1 of 25 slices shown (2 of 2)]
[im 1/25]
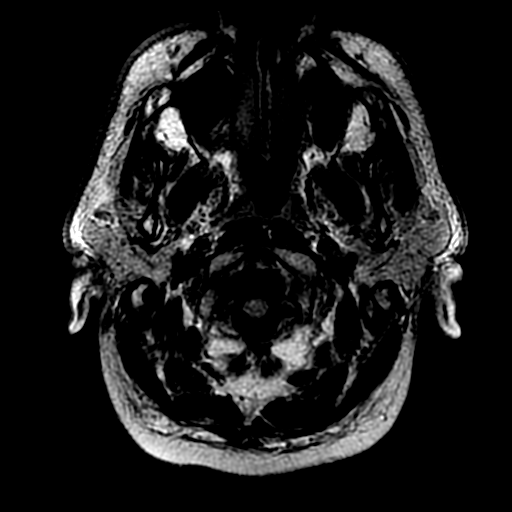

[Series 10: T2 · axial · 5.0mm · 0.43mm/px · 1 of 24 slices shown (3 of 6)]
[im 1/24]
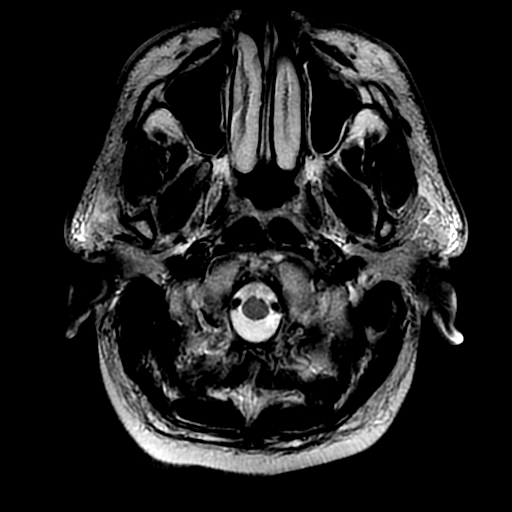

[Series 10: T2 · axial · 5.0mm · 0.43mm/px · 1 of 24 slices shown (4 of 6)]
[im 1/24]
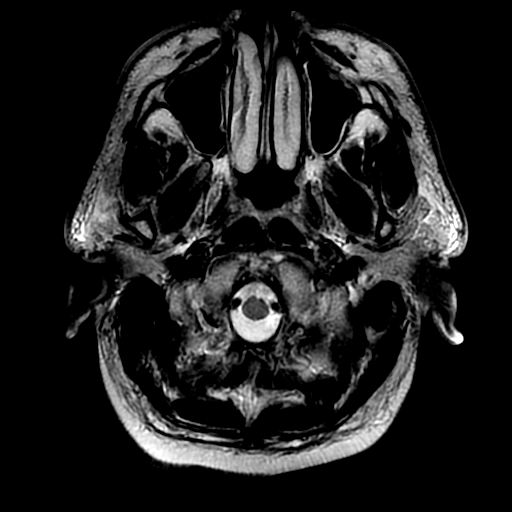

[Series 12: DWI · coronal · 4.0mm · 1.09mm/px · 2 of 84 slices shown (5 of 12)]
[im 1/84]
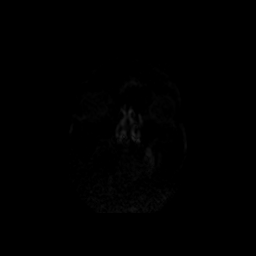
[im 84/84]
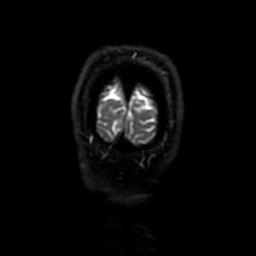

[Series 12: DWI · coronal · 4.0mm · 1.09mm/px · 2 of 84 slices shown (6 of 12)]
[im 1/84]
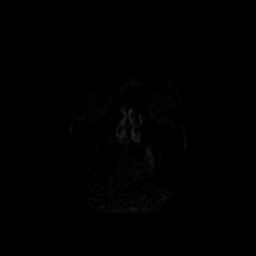
[im 84/84]
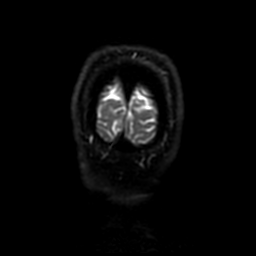

[Series 13: T2 · coronal · 5.0mm · 0.45mm/px · 1 of 26 slices shown (5 of 6)]
[im 1/26]
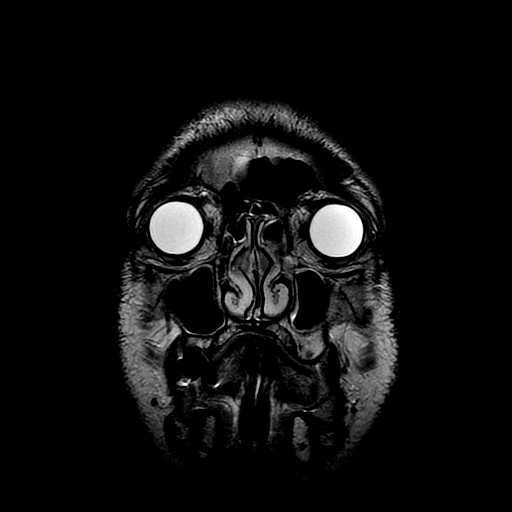

[Series 13: T2 · coronal · 5.0mm · 0.45mm/px · 1 of 26 slices shown (6 of 6)]
[im 1/26]
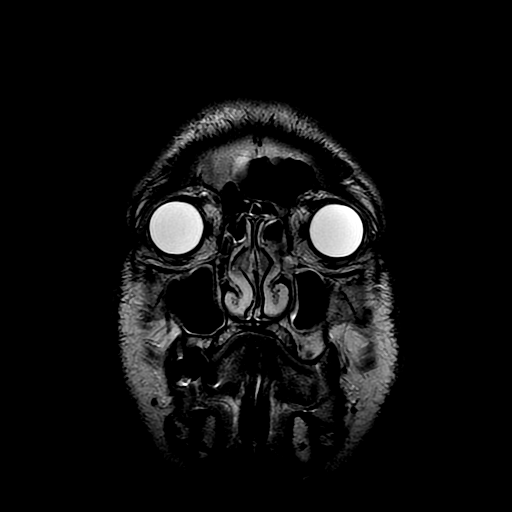

[Series 300: DWI · axial · 3.0mm · 1.09mm/px · 1 of 47 slices shown (7 of 12)]
[im 1/47]
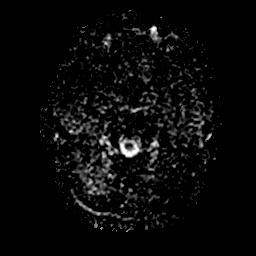

[Series 300: DWI · axial · 3.0mm · 1.09mm/px · 1 of 47 slices shown (8 of 12)]
[im 1/47]
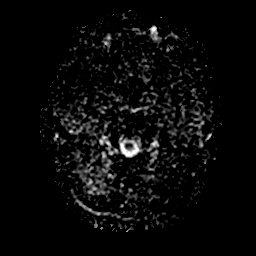

[Series 500: DWI · axial · 2.0mm · 1.09mm/px · 1 of 35 slices shown (9 of 12)]
[im 1/35]
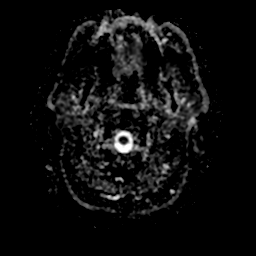

[Series 500: DWI · axial · 2.0mm · 1.09mm/px · 1 of 35 slices shown (10 of 12)]
[im 1/35]
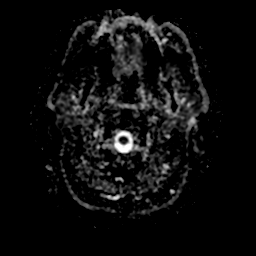

[Series 1200: DWI · coronal · 4.0mm · 1.09mm/px · 1 of 42 slices shown (11 of 12)]
[im 1/42]
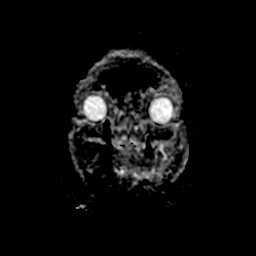

[Series 1200: DWI · coronal · 4.0mm · 1.09mm/px · 1 of 42 slices shown (12 of 12)]
[im 1/42]
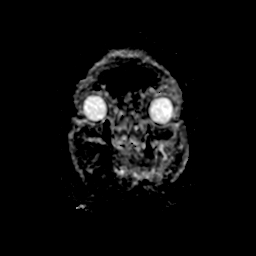

[29 of 48 positions shown; findings below may reference images not displayed]

FINDINGS: MRI HEAD FINDINGS

Brain: Diffusion imaging shows a 9 mm acute infarction within the
left para median pons. No other acute or subacute infarction.
Brainstem otherwise shows an old small vessel infarction in the
right para median pons. No focal cerebellar insult. Old small vessel
infarction in the right thalamus. Cerebral hemispheres show old
infarction in the right basal ganglia and in the white matter
adjacent to the frontal horn of the right lateral ventricle. There
are moderate chronic small-vessel ischemic changes diffusely
affecting the hemispheric deep white matter. No large vessel
territory infarction. No mass lesion, hemorrhage, hydrocephalus or
extra-axial collection.

Vascular: Major vessels at the base of the brain show flow.

Skull and upper cervical spine: Negative

Sinuses/Orbits: Clear/normal

Other: None

MRA HEAD FINDINGS

Both internal carotid arteries are patent through the skull base and
siphon regions. No flow limiting siphon stenosis. The anterior and
middle cerebral vessels are patent. There is a severe focal stenosis
1 of the 2 proximal left M2 branches. Distal vessels show some
atherosclerotic irregularity.

Both vertebral arteries are widely patent to the basilar. The
basilar artery shows atherosclerotic irregularity but no flow
limiting stenosis. Superior cerebellar and posterior cerebral
arteries show flow. Pronounced atherosclerotic irregularity of the
PCA branches, including a 50% or greater stenosis at the right P1 P2
junction.

MRA NECK FINDINGS

Antegrade flow in both common carotid arteries to the bifurcation
regions. Allowing for limitations of the noncontrast technique, both
carotid bifurcations appear normal. No suspected stenosis. Antegrade
flow in both vertebral arteries which are approximately equal in
size.
IMPRESSION: Acute 9 mm infarction affecting the left para median pons.

Old small vessel ischemic changes of the pons, right thalamus, right
basal ganglia and throughout the cerebral hemispheric white matter.

Negative noncontrast MR angiography of the neck vessels. No evidence
of carotid bifurcation disease. Antegrade flow in both vertebral
arteries.

No intracranial large or medium vessel occlusion. Severe stenosis of
the origin of 1 the 2 left M2 MCA branches. Moderate to severe
stenosis of the right posterior cerebral artery at the P1 P2
junction. Atherosclerotic irregularity of the more distal branch
vessels diffusely.

## 2019-01-14 MED ORDER — ACETAMINOPHEN 325 MG PO TABS: 650.0000 mg | ORAL_TABLET | ORAL | Status: DC | PRN

## 2019-01-14 MED ORDER — SENNOSIDES-DOCUSATE SODIUM 8.6-50 MG PO TABS: 1.0000 | ORAL_TABLET | Freq: Every evening | ORAL | Status: DC | PRN

## 2019-01-14 MED ORDER — ATORVASTATIN CALCIUM 40 MG PO TABS
80.0000 mg | ORAL_TABLET | Freq: Every day | ORAL | Status: DC
  Administered 2019-01-14 – 2019-01-15 (×2): 80 mg via ORAL
  Filled 2019-01-14 (×3): qty 2

## 2019-01-14 MED ORDER — LORAZEPAM 2 MG/ML IJ SOLN
1.0000 mg | Freq: Once | INTRAMUSCULAR | Status: AC
  Administered 2019-01-14: 1 mg via INTRAVENOUS
  Filled 2019-01-14: qty 1

## 2019-01-14 MED ORDER — STROKE: EARLY STAGES OF RECOVERY BOOK
Freq: Once | Status: DC
  Filled 2019-01-14: qty 1

## 2019-01-14 MED ORDER — ACETAMINOPHEN 650 MG RE SUPP: 650.0000 mg | RECTAL | Status: DC | PRN

## 2019-01-14 MED ORDER — ENOXAPARIN SODIUM 40 MG/0.4ML ~~LOC~~ SOLN
40.0000 mg | SUBCUTANEOUS | Status: DC
  Administered 2019-01-15 – 2019-01-16 (×2): 40 mg via SUBCUTANEOUS
  Filled 2019-01-14 (×3): qty 0.4

## 2019-01-14 MED ORDER — ASPIRIN EC 81 MG PO TBEC
81.0000 mg | DELAYED_RELEASE_TABLET | Freq: Every day | ORAL | Status: DC
  Administered 2019-01-14 – 2019-01-16 (×3): 81 mg via ORAL
  Filled 2019-01-14 (×3): qty 1

## 2019-01-14 MED ORDER — ACETAMINOPHEN 160 MG/5ML PO SOLN: 650.0000 mg | ORAL | Status: DC | PRN

## 2019-01-14 MED ORDER — INSULIN DETEMIR 100 UNIT/ML ~~LOC~~ SOLN
10.0000 [IU] | Freq: Once | SUBCUTANEOUS | Status: AC
  Administered 2019-01-14: 10 [IU] via SUBCUTANEOUS
  Filled 2019-01-14: qty 0.1

## 2019-01-14 MED ORDER — TAMSULOSIN HCL 0.4 MG PO CAPS
0.4000 mg | ORAL_CAPSULE | Freq: Every day | ORAL | Status: DC
  Administered 2019-01-14 – 2019-01-16 (×3): 0.4 mg via ORAL
  Filled 2019-01-14 (×3): qty 1

## 2019-01-14 MED ORDER — INSULIN ASPART 100 UNIT/ML ~~LOC~~ SOLN
0.0000 [IU] | Freq: Three times a day (TID) | SUBCUTANEOUS | Status: DC
  Administered 2019-01-14 (×2): 5 [IU] via SUBCUTANEOUS
  Administered 2019-01-14: 3 [IU] via SUBCUTANEOUS
  Administered 2019-01-15 (×2): 8 [IU] via SUBCUTANEOUS
  Administered 2019-01-15: 5 [IU] via SUBCUTANEOUS
  Administered 2019-01-16: 2 [IU] via SUBCUTANEOUS
  Administered 2019-01-16: 5 [IU] via SUBCUTANEOUS
  Filled 2019-01-14: qty 0.15

## 2019-01-14 NOTE — Progress Notes (Signed)
Inpatient Diabetes Program Recommendations  AACE/ADA: New Consensus Statement on Inpatient Glycemic Control (2015)  Target Ranges:  Prepandial:   less than 140 mg/dL      Peak postprandial:   less than 180 mg/dL (1-2 hours)      Critically ill patients:  140 - 180 mg/dL   Results for Jerry Moran, Jerry Moran (MRN [id number]) as of 01/14/2019 09:37  Ref. Range 01/13/2019 20:53 01/13/2019 22:33 01/14/2019 07:53  Glucose-Capillary Latest Ref Range: 70 - 99 mg/dL 399 (H) 352 (H)    10 units LEVEMIR given at 1:43am 213 (H)  5 units NOVOLOG given at 5am   Results for [name], [name] (MRN [id number]) as of 01/14/2019 09:37  Ref. Range 01/14/2019 04:49  Hemoglobin A1C Latest Ref Range: 4.8 - 5.6 % 11.5 (H)  (283 mg/dl)    Admit with: Dysarthria/ L sided weakness  History: DM, CVA (2009), HTN  Home DM Meds: Metformin 500 mg QID  Current Orders: Novolog Moderate Correction Scale/ SSI (0-15 units) TID AC     [facility] [place]     MD- Note patient was given 10 units Levemir at 1:43am last night.  Current A1c of 11.5% shows very poor glucose control at home.    Please consider adding Levemir 10 units QHS to start tonight  Do you anticipate patient will need insulin for home?      --Will follow patient during hospitalization--  Wyn Quaker RN, MSN, CDE Diabetes Coordinator Inpatient Glycemic Control Team Team Pager: [phone] (8a-5p)

## 2019-01-14 NOTE — ED Notes (Signed)
Patient transported to MRI 

## 2019-01-14 NOTE — ED Notes (Addendum)
Schorr, MD paged regarding pt needing medication prior to MRI scan

## 2019-01-14 NOTE — H&P (Addendum)
History and Physical    Jerry Moran [id number] DOB: March 10, 1956 DOA: 01/13/2019  PCP: [facility]  Patient coming from: Home  I have personally briefly reviewed patient's old medical records in [place]  Chief Complaint: Slurred speech  HPI: Jerry Moran is a 63 y.o. male with medical history significant of CVA in 2009, type 2 diabetes, hypertension hyperlipidemia who presents with concerns of slurred speech.  Wife at bedside reports that around 8 PM she noticed that patient has slurred speech.  No noted facial asymmetry.  Prior to that he reports being dizzy for about an hour.  Also noted some blurriness with reading.  He also endorses being fatigued and having generalized weakness.  Had a headache when he arrived in the ED but this has since resolved.  He denies any chest pain, shortness of breath or palpitation.  No GI symptoms including nausea, vomiting or diarrhea.  States he was otherwise in his normal state of health. Patient reports that he was started on aspirin after his previous stroke but eventually stopped taking it.  ED Course:He was afebrile mildly hypertensive up to 150s over 90s on room air.  He had bradycardia around the 50s.  CBC showed no leukocytosis or anemia.  CMP showed low sodium of 133 and creatinine of 1.42. Blood glucose of 405. CT head showed no acute intracranial hemorrhage.  There is a right basal ganglia old infarct noted.  Review of Systems: Constitutional: No Weight Change, No Fever ENT/Mouth: No sore throat, No Rhinorrhea Eyes: No Eye Pain, No Vision Changes Cardiovascular: No Chest Pain, no SOB, No Palpitations Respiratory: No Cough, No Sputum, No Wheezing, no Dyspnea  Gastrointestinal: No Nausea, No Vomiting, No Diarrhea, No Constipation, No Pain Genitourinary: no Urinary Incontinence, No Urgency, No Flank Pain Musculoskeletal: No Arthralgias, No Myalgias Skin: No Skin Lesions, No Pruritus, Neuro: + Weakness, No  Numbness,  No Loss of Consciousness, No Syncope Psych: No Anxiety/Panic, No Depression, no decrease appetite Heme/Lymph: No Bruising, No Bleeding  Past Medical History:  Diagnosis Date  . Diabetes mellitus without complication ([place])   . Hypertension     History reviewed. No pertinent surgical history.   reports that he has never smoked. He has never used smokeless tobacco. He reports that he does not drink alcohol or use drugs.  No Known Allergies  History reviewed. No pertinent family history. Family history reviewed and not pertinent  Prior to Admission medications   Medication Sig Start Date End Date Taking? Authorizing Provider  amLODipine (NORVASC) 10 MG tablet Take 10 mg by mouth daily.   Yes [provider]  lisinopril (ZESTRIL) 20 MG tablet Take 20 mg by mouth daily.   Yes [provider]  metFORMIN (GLUCOPHAGE) 500 MG tablet Take 500 mg by mouth 4 (four) times daily.   Yes [provider]  tamsulosin (FLOMAX) 0.4 MG CAPS capsule Take 0.4 mg by mouth daily.   Yes [provider]    Physical Exam: Vitals:   01/13/19 2140 01/13/19 2200 01/13/19 2230 01/13/19 2300  BP:  (!) 144/74 (!) 146/77 (!) 146/92  Pulse:  (!) 58 60 63  Resp:  19 19 17   Temp: 98.1 F (36.7 C)     TempSrc: Oral     SpO2:  99% 100% 99%  Weight:      Height:        Constitutional: NAD, calm, comfortable, Well appearing gentleman sitting up in bed Vitals:   01/13/19 2140 01/13/19 2200 01/13/19 2230 01/13/19  2300  BP:  (!) 144/74 (!) 146/77 (!) 146/92  Pulse:  (!) 58 60 63  Resp:  19 19 17   Temp: 98.1 F (36.7 C)     TempSrc: Oral     SpO2:  99% 100% 99%  Weight:      Height:       Eyes: PERRL, lids and conjunctivae normal ENMT: Mucous membranes are moist. Posterior pharynx clear of any exudate or lesions.  Neck: normal, supple, no masses, no thyromegaly Respiratory: clear to auscultation bilaterally, no wheezing, no crackles. Normal respiratory effort.  No accessory muscle use.  Cardiovascular: Regular rate and rhythm, no murmurs / rubs / gallops. No extremity edema. 2+ pedal pulses. No carotid bruits.  Abdomen: no tenderness, no masses palpated. Bowel sounds positive.  Musculoskeletal: no clubbing / cyanosis. No joint deformity upper and lower extremities. Good ROM, no contractures. Normal muscle tone.  Skin: no rashes, lesions, ulcers. No induration Neurologic: No facial asymmetry, CN 2-12 grossly intact. Sensation intact, DTR normal. Strength 5/5 in all 4. Intact finger to nose. Intact heel to shin.  Psychiatric: Normal judgment and insight. Alert and oriented x 3. Normal mood.     Labs on Admission: I have personally reviewed following labs and imaging studies  CBC: Recent Labs  Lab 01/13/19 2127  WBC 5.4  NEUTROABS 3.4  HGB 13.3  HCT 38.0*  MCV 89.2  PLT 0000000   Basic Metabolic Panel: Recent Labs  Lab 01/13/19 2127  NA 133*  K 4.1  CL 100  CO2 24  GLUCOSE 405*  BUN 14  CREATININE 1.42*  CALCIUM 8.9   GFR: Estimated Creatinine Clearance: 51.9 mL/min (A) (by C-G formula based on SCr of 1.42 mg/dL (H)). Liver Function Tests: Recent Labs  Lab 01/13/19 2127  AST 22  ALT 16  ALKPHOS 65  BILITOT 1.1  PROT 7.0  ALBUMIN 3.6   No results for input(s): LIPASE, AMYLASE in the last 168 hours. No results for input(s): AMMONIA in the last 168 hours. Coagulation Profile: Recent Labs  Lab 01/13/19 2127  INR 0.9   Cardiac Enzymes: No results for input(s): CKTOTAL, CKMB, CKMBINDEX, TROPONINI in the last 168 hours. BNP (last 3 results) No results for input(s): PROBNP in the last 8760 hours. HbA1C: No results for input(s): HGBA1C in the last 72 hours. CBG: Recent Labs  Lab 01/13/19 2053 01/13/19 2233  GLUCAP 399* 352*   Lipid Profile: No results for input(s): CHOL, HDL, LDLCALC, TRIG, CHOLHDL, LDLDIRECT in the last 72 hours. Thyroid Function Tests: No results for input(s): TSH, T4TOTAL, FREET4, T3FREE,  THYROIDAB in the last 72 hours. Anemia Panel: No results for input(s): VITAMINB12, FOLATE, FERRITIN, TIBC, IRON, RETICCTPCT in the last 72 hours. Urine analysis:    Component Value Date/Time   COLORURINE STRAW (A) 01/13/2019 2055   APPEARANCEUR CLEAR 01/13/2019 2055   LABSPEC 1.028 01/13/2019 2055   PHURINE 6.0 01/13/2019 2055   GLUCOSEU >=500 (A) 01/13/2019 2055   [place] NEGATIVE 01/13/2019 2055   [place] 01/13/2019 2055   [place] 01/13/2019 2055   PROTEINUR 30 (A) 01/13/2019 2055   NITRITE NEGATIVE 01/13/2019 2055   LEUKOCYTESUR NEGATIVE 01/13/2019 2055    Radiological Exams on Admission: Ct Head Wo Contrast  Result Date: 01/13/2019 CLINICAL DATA:  63 year old male with slurred speech and focal neurologic deficit. EXAM: CT HEAD WITHOUT CONTRAST TECHNIQUE: Contiguous axial images were obtained from the base of the skull through the vertex without intravenous contrast. COMPARISON:  None. FINDINGS: Brain: There is mild  age-related atrophy and chronic microvascular ischemic changes. 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 There is no acute intracranial hemorrhage. No mass effect or midline shift. No extra-axial fluid collection. Vascular: No hyperdense vessel or unexpected calcification. Skull: Normal. Negative for fracture or focal lesion. Sinuses/Orbits: No acute finding. Other: Right frontal scalp scarring. IMPRESSION: 1. No acute intracranial hemorrhage. 2. Mild age-related atrophy and chronic microvascular ischemic changes. Old right basal ganglia infarct. Electronically Signed   By: Anner Crete M.D.   On: 01/13/2019 21:25    EKG: Independently reviewed.   Assessment/Plan  Dysarthria -Hx of right basal ganglia infarct.  Not currently taking aspirin. - Hold antihypertensive - MRI brain - echocardiogram  - start daily aspirin and atorvastatin -Obtain A1c and lipids -PT/OT/SLT -Frequent neuro checks and keep on telemetry -Allow for permissive  hypertension with blood pressure treatment as needed only if systolic goes above [id number] - neurology consulted   Asymptomatic bradycardia - EKG shows sinus rhythm with right bundle branch block - HR of 50. Monitor on telemetry   AKI - creatine of 1.42. No prior baseline for comparison - monitor with BMP - avoid nephrotoxic agent - hold Lisinopril   Type 2 diabetes with hypoglycemia -Blood glucose of 405 on admission - check HbA1C - Levemir 10 units x 1  - Moderate SSI   BPH - continue Flomax   DVT prophylaxis:Lovenox Code Status:Full Family Communication: Plan discussed with patient and wife at bedside  disposition Plan: home after observation and neurology workup. [facility] for [facility] brain and neurology evaluation Consults called: Neurology Admission status: obs   Tanith Dagostino T Anne Sebring DO [facility]   If 7PM-7AM, please contact night-coverage [email or web address] Password [email or web address]  01/14/2019, 12:19 AM

## 2019-01-14 NOTE — ED Notes (Signed)
Admitting MD at bedside.

## 2019-01-14 NOTE — Progress Notes (Signed)
PT Note  Patient Details Name: Jerry Moran MRN: [id number] DOB: 1955-10-07        Order received chart reviewed, noted pt to MRI within this hour, will hold on eval at this time and try back this afternoon.  Please reach out if you need [place] prior to that time.   Clide Dales 01/14/2019, 11:28 AM  Clide Dales, PT Acute Rehabilitation Services Pager: [phone] Office: [phone] 01/14/2019

## 2019-01-14 NOTE — Progress Notes (Signed)
Patient had an HS CBG of 280 with no coverage scheduled; notified on call and awaiting orders.

## 2019-01-14 NOTE — Progress Notes (Signed)
  Echocardiogram 2D Echocardiogram has been performed.  Jerry Moran 01/14/2019, 2:48 PM

## 2019-01-14 NOTE — Progress Notes (Signed)
Patient seen and examined.  He is waiting in the emergency room for inpatient bed availability.  63 year old gentleman with history of TIA 8 years ago as per him presented with transient onset of dysarthria no other symptoms.  He does follow-up at the [place].  Patient states that he never had any problem for the last 8 years.  Plan: Clinical findings, dysarthria CT head findings, no acute findings MRI of the brain, MRA of the head and neck, pending 2D echocardiogram, pending. Antiplatelet therapy, LDL, statin, pending Hemoglobin A1c, pending  Clinically stabilizing.  Will stay in the hospital today to finish his stroke work-up including MRIs, therapy evaluations and optimization of medications.

## 2019-01-14 NOTE — Progress Notes (Signed)
New Admission Note:  Arrival Method: Care Link via stretcher Mental Orientation: A/O x 4 Telemetry: Box [id number] Assessment: Completed IV: Left Hand 20G Pain:No complaints of pain Safety Measures: Safety Fall Prevention Plan was given, discussed. Admission: Completed 3W: Patient has been orientated to the room, unit and the staff. Family:No family at bedside  Orders have been reviewed and implemented. Will continue to monitor the patient. Call light has been placed within reach and bed alarm has been activated.   Janus Molder ,RN

## 2019-01-14 NOTE — ED Notes (Addendum)
Due to high census in the hospital, pt holding in the ED. Pt placed on a hospital bed for comfort. Bed in locked and lowest position. Call bell within reach. No further needs at this time.

## 2019-01-14 NOTE — ED Notes (Signed)
ED TO INPATIENT HANDOFF REPORT  ED Nurse Name and Phone #: [phone]  S Name/Age/Gender Jerry Moran 63 y.o. male Room/Bed: WA17/WA17  Code Status   Code Status: Full Code  Home/SNF/Other Home Patient oriented to: self, place, time and situation Is this baseline? Yes   Triage Complete: Triage complete  Chief Complaint  [phone]  Triage Note EMS was called by pt's wife for slurred speech, when EMS arrived he was A&O x 4, no pain, no slurred speech, no drift and no facial droop Pt is noncompliant with his diabetic meds Pt is also hypertensive    Allergies No Known Allergies  Level of Care/Admitting Diagnosis ED Disposition    ED Disposition Condition [place]: [place] [[id number]]  Level of Care: Telemetry Medical [104]  Covid Evaluation: Confirmed COVID Negative  Diagnosis: TIA (transient ischemic attack) [id number]  Admitting Physician: Barb Merino [id number]  Attending Physician: Barb Merino [id number]  Estimated length of stay: past midnight tomorrow  Certification:: I certify this patient will need inpatient services for at least 2 midnights  PT Class (Do Not Modify): Inpatient [101]  PT Acc Code (Do Not Modify): Private [1]       B Medical/Surgery History Past Medical History:  Diagnosis Date  . Diabetes mellitus without complication ([place])   . Hypertension    History reviewed. No pertinent surgical history.   A IV Location/Drains/Wounds Patient Lines/Drains/Airways Status   Active Line/Drains/Airways    Name:   Placement date:   Placement time:   Site:   Days:   Peripheral IV 01/13/19 Left Hand   01/13/19    2058    Hand   1          Intake/Output Last 24 hours  Intake/Output Summary (Last 24 hours) at 01/14/2019 1513 Last data filed at 01/13/2019 2231 Gross per 24 hour  Intake 1000 ml  Output -  Net 1000 ml    Labs/Imaging Results for orders placed or performed during the hospital encounter  of 01/13/19 (from the past 48 hour(s))  CBG monitoring, ED     Status: Abnormal   Collection Time: 01/13/19  8:53 PM  Result Value Ref Range   Glucose-Capillary 399 (H) 70 - 99 mg/dL  Urine rapid drug screen (hosp performed)not at [facility]     Status: None   Collection Time: 01/13/19  8:55 PM  Result Value Ref Range   Opiates NONE DETECTED NONE DETECTED   Cocaine NONE DETECTED NONE DETECTED   Benzodiazepines NONE DETECTED NONE DETECTED   Amphetamines NONE DETECTED NONE DETECTED   Tetrahydrocannabinol NONE DETECTED NONE DETECTED   Barbiturates NONE DETECTED NONE DETECTED    Comment: (NOTE) DRUG SCREEN FOR MEDICAL PURPOSES ONLY.  IF CONFIRMATION IS NEEDED FOR ANY PURPOSE, NOTIFY LAB WITHIN 5 DAYS. LOWEST DETECTABLE LIMITS FOR URINE DRUG SCREEN Drug Class                     Cutoff (ng/mL) Amphetamine and metabolites    1000 Barbiturate and metabolites    200 Benzodiazepine                 [id number] Tricyclics and metabolites     300 Opiates and metabolites        300 Cocaine and metabolites        300 THC  50 Performed at [facility], [place] [address], [place], [place] [address]   Urinalysis, Routine w reflex microscopic (not at [facility])     Status: Abnormal   Collection Time: 01/13/19  8:55 PM  Result Value Ref Range   Color, Urine STRAW (A) YELLOW   APPearance CLEAR CLEAR   Specific Gravity, Urine 1.028 1.005 - 1.030   pH 6.0 5.0 - 8.0   Glucose, UA >=500 (A) NEGATIVE mg/dL   Hgb urine dipstick NEGATIVE NEGATIVE   Bilirubin Urine NEGATIVE NEGATIVE   Ketones, ur NEGATIVE NEGATIVE mg/dL   Protein, ur 30 (A) NEGATIVE mg/dL   Nitrite NEGATIVE NEGATIVE   Leukocytes,Ua NEGATIVE NEGATIVE   WBC, UA 0-5 0 - 5 WBC/hpf   Bacteria, UA NONE SEEN NONE SEEN    Comment: Performed at [facility], [place] [address], [place], [place] [address]  Ethanol     Status: None   Collection Time: 01/13/19  9:27 PM  Result Value Ref Range    Alcohol, Ethyl (B) <10 <10 mg/dL    Comment: (NOTE) Lowest detectable limit for serum alcohol is 10 mg/dL. For medical purposes only. Performed at [facility], [place] [address], [place], [place] [address]   Protime-INR     Status: None   Collection Time: 01/13/19  9:27 PM  Result Value Ref Range   Prothrombin Time 12.4 11.4 - 15.2 seconds   INR 0.9 0.8 - 1.2    Comment: (NOTE) INR goal varies based on device and disease states. Performed at [facility], [place] [address], [place], [place] [address]   APTT     Status: None   Collection Time: 01/13/19  9:27 PM  Result Value Ref Range   aPTT 25 24 - 36 seconds    Comment: Performed at [facility], [place] [address], [place], [place] [address]  CBC     Status: Abnormal   Collection Time: 01/13/19  9:27 PM  Result Value Ref Range   WBC 5.4 4.0 - 10.5 K/uL   RBC 4.26 4.22 - 5.81 MIL/uL   Hemoglobin 13.3 13.0 - 17.0 g/dL   HCT 38.0 (L) 39.0 - 52.0 %   MCV 89.2 80.0 - 100.0 fL   MCH 31.2 26.0 - 34.0 pg   MCHC 35.0 30.0 - 36.0 g/dL   RDW 12.0 11.5 - 15.5 %   Platelets 176 150 - 400 K/uL   nRBC 0.0 0.0 - 0.2 %    Comment: Performed at [facility], [place] [address], [place], [place] [address]  Differential     Status: None   Collection Time: 01/13/19  9:27 PM  Result Value Ref Range   Neutrophils Relative % 62 %   Neutro Abs 3.4 1.7 - 7.7 K/uL   Lymphocytes Relative 26 %   Lymphs Abs 1.4 0.7 - 4.0 K/uL   Monocytes Relative 11 %   Monocytes Absolute 0.6 0.1 - 1.0 K/uL   Eosinophils Relative 1 %   Eosinophils Absolute 0.1 0.0 - 0.5 K/uL   Basophils Relative 0 %   Basophils Absolute 0.0 0.0 - 0.1 K/uL   Immature Granulocytes 0 %   Abs Immature Granulocytes 0.01 0.00 - 0.07 K/uL    Comment: Performed at [facility], [place] [address] [place], [place] [address]  Comprehensive metabolic panel     Status: Abnormal   Collection Time: 01/13/19   9:27 PM  Result Value Ref Range   Sodium  133 (L) 135 - 145 mmol/L   Potassium 4.1 3.5 - 5.1 mmol/L   Chloride 100 98 - 111 mmol/L   CO2 24 22 - 32 mmol/L   Glucose, Bld 405 (H) 70 - 99 mg/dL   BUN 14 8 - 23 mg/dL   Creatinine, Ser 1.42 (H) 0.61 - 1.24 mg/dL   Calcium 8.9 8.9 - 10.3 mg/dL   Total Protein 7.0 6.5 - 8.1 g/dL   Albumin 3.6 3.5 - 5.0 g/dL   AST 22 15 - 41 U/L   ALT 16 0 - 44 U/L   Alkaline Phosphatase 65 38 - 126 U/L   Total Bilirubin 1.1 0.3 - 1.2 mg/dL   GFR calc non Af Amer 51 (L) >60 mL/min   GFR calc Af Amer 60 (L) >60 mL/min   Anion gap 9 5 - 15    Comment: Performed at [facility], [place] [address] [place], [place] [address]  CBG monitoring, ED     Status: Abnormal   Collection Time: 01/13/19 10:33 PM  Result Value Ref Range   Glucose-Capillary 352 (H) 70 - 99 mg/dL  SARS Coronavirus 2 by RT PCR (hospital order, performed in [place] hospital lab)     Status: None   Collection Time: 01/13/19 11:07 PM  Result Value Ref Range   SARS Coronavirus 2 NEGATIVE NEGATIVE    Comment: (NOTE) If result is NEGATIVE SARS-CoV-2 target nucleic acids are NOT DETECTED. The SARS-CoV-2 RNA is generally detectable in upper and lower  respiratory specimens during the acute phase of infection. The lowest  concentration of SARS-CoV-2 viral copies this assay can detect is 250  copies / mL. A negative result does not preclude SARS-CoV-2 infection  and should not be used as the sole basis for treatment or other  patient management decisions.  A negative result may occur with  improper specimen collection / handling, submission of specimen other  than nasopharyngeal swab, presence of viral mutation(s) within the  areas targeted by this assay, and inadequate number of viral copies  (<250 copies / mL). A negative result must be combined with clinical  observations, patient history, and epidemiological information. If result is POSITIVE SARS-CoV-2 target nucleic  acids are DETECTED. The SARS-CoV-2 RNA is generally detectable in upper and lower  respiratory specimens dur ing the acute phase of infection.  Positive  results are indicative of active infection with SARS-CoV-2.  Clinical  correlation with patient history and other diagnostic information is  necessary to determine patient infection status.  Positive results do  not rule out bacterial infection or co-infection with other viruses. If result is PRESUMPTIVE POSTIVE SARS-CoV-2 nucleic acids MAY BE PRESENT.   A presumptive positive result was obtained on the submitted specimen  and confirmed on repeat testing.  While 2019 novel coronavirus  (SARS-CoV-2) nucleic acids may be present in the submitted sample  additional confirmatory testing may be necessary for epidemiological  and / or clinical management purposes  to differentiate between  SARS-CoV-2 and other Sarbecovirus currently known to infect humans.  If clinically indicated additional testing with an alternate test  methodology [phone]) is advised. The SARS-CoV-2 RNA is generally  detectable in upper and lower respiratory sp ecimens during the acute  phase of infection. The expected result is Negative. Fact Sheet for Patients:  StrictlyIdeas.no Fact Sheet for Healthcare Providers: BankingDealers.co.za This test is not yet approved or cleared by the [place] FDA and has been authorized for detection and/or diagnosis of SARS-CoV-2 by  FDA under an Emergency Use Authorization (EUA).  This EUA will remain in effect (meaning this test can be used) for the duration of the COVID-19 declaration under Section 564(b)(1) of the Act, 21 U.S.C. section 360bbb-3(b)(1), unless the authorization is terminated or revoked sooner. Performed at [facility], [place] [address], [place], [place] [address]   HIV Antibody (routine testing w rflx)     Status: None   Collection Time:  01/14/19  2:33 AM  Result Value Ref Range   HIV Screen 4th Generation wRfx NON REACTIVE NON REACTIVE    Comment: Performed at [facility] [address], [place], [place] [address]  Hemoglobin A1c     Status: Abnormal   Collection Time: 01/14/19  4:49 AM  Result Value Ref Range   Hgb A1c MFr Bld 11.5 (H) 4.8 - 5.6 %    Comment: (NOTE) Pre diabetes:          5.7%-6.4% Diabetes:              >6.4% Glycemic control for   <7.0% adults with diabetes    Mean Plasma Glucose 283.35 mg/dL    Comment: Performed at [facility] [address], [place], [place] [address]  Lipid panel     Status: Abnormal   Collection Time: 01/14/19  4:49 AM  Result Value Ref Range   Cholesterol 252 (H) 0 - 200 mg/dL   Triglycerides 258 (H) <150 mg/dL   HDL 35 (L) >40 mg/dL   Total CHOL/HDL Ratio 7.2 RATIO   VLDL 52 (H) 0 - 40 mg/dL   LDL Cholesterol 165 (H) 0 - 99 mg/dL    Comment:        Total Cholesterol/HDL:CHD Risk Coronary Heart Disease Risk Table                     Men   Women  1/2 Average Risk   3.4   3.3  Average Risk       5.0   4.4  2 X Average Risk   9.6   7.1  3 X Average Risk  23.4   11.0        Use the calculated Patient Ratio above and the CHD Risk Table to determine the patient's CHD Risk.        ATP III CLASSIFICATION (LDL):  <100     mg/dL   Optimal  100-129  mg/dL   Near or Above                    Optimal  130-159  mg/dL   Borderline  160-189  mg/dL   High  >190     mg/dL   Very High Performed at [facility] [address], [place], [place] [address]   CBG monitoring, ED     Status: Abnormal   Collection Time: 01/14/19  7:53 AM  Result Value Ref Range   Glucose-Capillary 213 (H) 70 - 99 mg/dL  CBG monitoring, ED     Status: Abnormal   Collection Time: 01/14/19 12:25 PM  Result Value Ref Range   Glucose-Capillary 196 (H) 70 - 99 mg/dL   Ct Head Wo Contrast  Result Date: 01/13/2019 CLINICAL DATA:  63 year old male with slurred  speech and focal neurologic deficit. EXAM: CT HEAD WITHOUT CONTRAST TECHNIQUE: Contiguous axial images were obtained from the base of the skull through the vertex without intravenous contrast. COMPARISON:  None. FINDINGS: Brain: There  is mild age-related atrophy and chronic microvascular ischemic changes. 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 There is no acute intracranial hemorrhage. No mass effect or midline shift. No extra-axial fluid collection. Vascular: No hyperdense vessel or unexpected calcification. Skull: Normal. Negative for fracture or focal lesion. Sinuses/Orbits: No acute finding. Other: Right frontal scalp scarring. IMPRESSION: 1. No acute intracranial hemorrhage. 2. Mild age-related atrophy and chronic microvascular ischemic changes. Old right basal ganglia infarct. Electronically Signed   By: Anner Crete M.D.   On: 01/13/2019 21:25   Mr Angio Head Wo Contrast  Result Date: 01/14/2019 CLINICAL DATA:  History of hypertension and diabetes. Acute presentation with slurred speech and dizziness. EXAM: MRI HEAD WITHOUT CONTRAST MRA HEAD WITHOUT CONTRAST MRA NECK WITHOUT CONTRAST TECHNIQUE: Multiplanar, multiecho pulse sequences of the brain and surrounding structures were obtained without intravenous contrast. Angiographic images of the Circle of Willis were obtained using MRA technique without intravenous contrast. Angiographic images of the neck were obtained using MRA technique without intravenous contrast. Carotid stenosis measurements (when applicable) are obtained utilizing NASCET criteria, using the distal internal carotid diameter as the denominator. COMPARISON:  Head CT 01/13/2019 FINDINGS: MRI HEAD FINDINGS Brain: Diffusion imaging shows a 9 mm acute infarction within the left para median pons. No other acute or subacute infarction. Brainstem otherwise shows an old small vessel infarction in the right para median pons. No focal cerebellar insult. Old small vessel infarction in the  right thalamus. Cerebral hemispheres show old infarction in the right basal ganglia and in the white matter adjacent to the frontal horn of the right lateral ventricle. There are moderate chronic small-vessel ischemic changes diffusely affecting the hemispheric deep white matter. No large vessel territory infarction. No mass lesion, hemorrhage, hydrocephalus or extra-axial collection. Vascular: Major vessels at the base of the brain show flow. Skull and upper cervical spine: Negative Sinuses/Orbits: Clear/normal Other: None MRA HEAD FINDINGS Both internal carotid arteries are patent through the skull base and siphon regions. No flow limiting siphon stenosis. The anterior and middle cerebral vessels are patent. There is a severe focal stenosis 1 of the 2 proximal left M2 branches. Distal vessels show some atherosclerotic irregularity. Both vertebral arteries are widely patent to the basilar. The basilar artery shows atherosclerotic irregularity but no flow limiting stenosis. Superior cerebellar and posterior cerebral arteries show flow. Pronounced atherosclerotic irregularity of the PCA branches, including a 50% or greater stenosis at the right P1 P2 junction. MRA NECK FINDINGS Antegrade flow in both common carotid arteries to the bifurcation regions. Allowing for limitations of the noncontrast technique, both carotid bifurcations appear normal. No suspected stenosis. Antegrade flow in both vertebral arteries which are approximately equal in size. IMPRESSION: Acute 9 mm infarction affecting the left para median pons. Old small vessel ischemic changes of the pons, right thalamus, right basal ganglia and throughout the cerebral hemispheric white matter. Negative noncontrast MR angiography of the neck vessels. No evidence of carotid bifurcation disease. Antegrade flow in both vertebral arteries. No intracranial large or medium vessel occlusion. 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 Moderate to  severe stenosis of the right posterior cerebral artery at the P1 P2 junction. Atherosclerotic irregularity of the more distal branch vessels diffusely. Electronically Signed   By: Nelson Chimes M.D.   On: 01/14/2019 13:38   Mr Angio Neck Wo Contrast  Result Date: 01/14/2019 CLINICAL DATA:  History of hypertension and diabetes. Acute presentation with slurred speech and dizziness. EXAM: MRI HEAD WITHOUT CONTRAST MRA  HEAD WITHOUT CONTRAST MRA NECK WITHOUT CONTRAST TECHNIQUE: Multiplanar, multiecho pulse sequences of the brain and surrounding structures were obtained without intravenous contrast. Angiographic images of the Circle of Willis were obtained using MRA technique without intravenous contrast. Angiographic images of the neck were obtained using MRA technique without intravenous contrast. Carotid stenosis measurements (when applicable) are obtained utilizing NASCET criteria, using the distal internal carotid diameter as the denominator. COMPARISON:  Head CT 01/13/2019 FINDINGS: MRI HEAD FINDINGS Brain: Diffusion imaging shows a 9 mm acute infarction within the left para median pons. No other acute or subacute infarction. Brainstem otherwise shows an old small vessel infarction in the right para median pons. No focal cerebellar insult. Old small vessel infarction in the right thalamus. Cerebral hemispheres show old infarction in the right basal ganglia and in the white matter adjacent to the frontal horn of the right lateral ventricle. There are moderate chronic small-vessel ischemic changes diffusely affecting the hemispheric deep white matter. No large vessel territory infarction. No mass lesion, hemorrhage, hydrocephalus or extra-axial collection. Vascular: Major vessels at the base of the brain show flow. Skull and upper cervical spine: Negative Sinuses/Orbits: Clear/normal Other: None MRA HEAD FINDINGS Both internal carotid arteries are patent through the skull base and siphon regions. No flow limiting  siphon stenosis. The anterior and middle cerebral vessels are patent. There is a severe focal stenosis 1 of the 2 proximal left M2 branches. Distal vessels show some atherosclerotic irregularity. Both vertebral arteries are widely patent to the basilar. The basilar artery shows atherosclerotic irregularity but no flow limiting stenosis. Superior cerebellar and posterior cerebral arteries show flow. Pronounced atherosclerotic irregularity of the PCA branches, including a 50% or greater stenosis at the right P1 P2 junction. MRA NECK FINDINGS Antegrade flow in both common carotid arteries to the bifurcation regions. Allowing for limitations of the noncontrast technique, both carotid bifurcations appear normal. No suspected stenosis. Antegrade flow in both vertebral arteries which are approximately equal in size. IMPRESSION: Acute 9 mm infarction affecting the left para median pons. Old small vessel ischemic changes of the pons, right thalamus, right basal ganglia and throughout the cerebral hemispheric white matter. Negative noncontrast MR angiography of the neck vessels. No evidence of carotid bifurcation disease. Antegrade flow in both vertebral arteries. No intracranial large or medium vessel occlusion. 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 Moderate to severe stenosis of the right posterior cerebral artery at the P1 P2 junction. Atherosclerotic irregularity of the more distal branch vessels diffusely. Electronically Signed   By: Nelson Chimes M.D.   On: 01/14/2019 13:38   Mr Brain Wo Contrast  Result Date: 01/14/2019 CLINICAL DATA:  History of hypertension and diabetes. Acute presentation with slurred speech and dizziness. EXAM: MRI HEAD WITHOUT CONTRAST MRA HEAD WITHOUT CONTRAST MRA NECK WITHOUT CONTRAST TECHNIQUE: Multiplanar, multiecho pulse sequences of the brain and surrounding structures were obtained without intravenous contrast. Angiographic images of the Circle of Willis were  obtained using MRA technique without intravenous contrast. Angiographic images of the neck were obtained using MRA technique without intravenous contrast. Carotid stenosis measurements (when applicable) are obtained utilizing NASCET criteria, using the distal internal carotid diameter as the denominator. COMPARISON:  Head CT 01/13/2019 FINDINGS: MRI HEAD FINDINGS Brain: Diffusion imaging shows a 9 mm acute infarction within the left para median pons. No other acute or subacute infarction. Brainstem otherwise shows an old small vessel infarction in the right para median pons. No focal cerebellar insult. Old small vessel infarction  in the right thalamus. Cerebral hemispheres show old infarction in the right basal ganglia and in the white matter adjacent to the frontal horn of the right lateral ventricle. There are moderate chronic small-vessel ischemic changes diffusely affecting the hemispheric deep white matter. No large vessel territory infarction. No mass lesion, hemorrhage, hydrocephalus or extra-axial collection. Vascular: Major vessels at the base of the brain show flow. Skull and upper cervical spine: Negative Sinuses/Orbits: Clear/normal Other: None MRA HEAD FINDINGS Both internal carotid arteries are patent through the skull base and siphon regions. No flow limiting siphon stenosis. The anterior and middle cerebral vessels are patent. There is a severe focal stenosis 1 of the 2 proximal left M2 branches. Distal vessels show some atherosclerotic irregularity. Both vertebral arteries are widely patent to the basilar. The basilar artery shows atherosclerotic irregularity but no flow limiting stenosis. Superior cerebellar and posterior cerebral arteries show flow. Pronounced atherosclerotic irregularity of the PCA branches, including a 50% or greater stenosis at the right P1 P2 junction. MRA NECK FINDINGS Antegrade flow in both common carotid arteries to the bifurcation regions. Allowing for limitations of the  noncontrast technique, both carotid bifurcations appear normal. No suspected stenosis. Antegrade flow in both vertebral arteries which are approximately equal in size. IMPRESSION: Acute 9 mm infarction affecting the left para median pons. Old small vessel ischemic changes of the pons, right thalamus, right basal ganglia and throughout the cerebral hemispheric white matter. Negative noncontrast MR angiography of the neck vessels. No evidence of carotid bifurcation disease. Antegrade flow in both vertebral arteries. No intracranial large or medium vessel occlusion. 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 Moderate to severe stenosis of the right posterior cerebral artery at the P1 P2 junction. Atherosclerotic irregularity of the more distal branch vessels diffusely. Electronically Signed   By: Nelson Chimes M.D.   On: 01/14/2019 13:38    Pending Labs Unresulted Labs (From admission, onward)    Start     Ordered   01/14/19 0015  HIV4GL Save Tube  (HIV Antibody (Routine testing w reflex) panel)  Once,   STAT     01/14/19 0018          Vitals/Pain Today's Vitals   01/14/19 1243 01/14/19 1300 01/14/19 1330 01/14/19 1430  BP: (!) 160/83 (!) 134/99 139/72 (!) 142/80  Pulse: 61 74 65 64  Resp: 19 16 18 16   Temp:      TempSrc:      SpO2: 97% 97% 99% 96%  Weight:      Height:      PainSc:        Isolation Precautions No active isolations  Medications Medications  sodium chloride 0.9 % bolus 1,000 mL (0 mLs Intravenous Stopped 01/13/19 2231)    Followed by  0.9 %  sodium chloride infusion (1,000 mLs Intravenous New Bag/Given 01/13/19 2234)  acetaminophen (TYLENOL) tablet 650 mg (0 mg Oral Hold 01/14/19 0011)   stroke: mapping our early stages of recovery book (0 each Does not apply Hold 01/14/19 0220)  acetaminophen (TYLENOL) tablet 650 mg (has no administration in time range)    Or  acetaminophen (TYLENOL) solution 650 mg (has no administration in time range)    Or   acetaminophen (TYLENOL) suppository 650 mg (has no administration in time range)  senna-docusate (Senokot-S) tablet 1 tablet (has no administration in time range)  enoxaparin (LOVENOX) injection 40 mg (has no administration in time range)  tamsulosin (FLOMAX) capsule 0.4 mg (0.4 mg  Oral Given 01/14/19 1053)  aspirin EC tablet 81 mg (81 mg Oral Given 01/14/19 1053)  atorvastatin (LIPITOR) tablet 80 mg (has no administration in time range)  insulin aspart (novoLOG) injection 0-15 Units (3 Units Subcutaneous Given 01/14/19 1253)  insulin detemir (LEVEMIR) injection 10 Units (10 Units Subcutaneous Given 01/14/19 0143)  LORazepam (ATIVAN) injection 1 mg (1 mg Intravenous Given 01/14/19 1054)    Mobility walks     Focused Assessments Cardiac Assessment Handoff:  Cardiac Rhythm: Normal sinus rhythm No results found for: CKTOTAL, CKMB, CKMBINDEX, TROPONINI No results found for: DDIMER Does the Patient currently have chest pain? No       , Neuro Assessment Handoff:  Swallow screen pass? Yes  Cardiac Rhythm: Normal sinus rhythm NIH Stroke Scale ( + Modified Stroke Scale Criteria)  LOC Questions (1b. )   +: Answers both questions correctly LOC Commands (1c. )   + : Performs both tasks correctly Best Gaze (2. )  +: Normal Visual (3. )  +: No visual loss Motor Arm, Left (5a. )   +: No drift Motor Arm, Right (5b. )   +: No drift Motor Leg, Left (6a. )   +: No drift Motor Leg, Right (6b. )   +: No drift Sensory (8. )   +: Normal, no sensory loss Best Language (9. )   +: No aphasia Extinction/Inattention (11.)   +: No Abnormality Modified SS Total  +: 0     Neuro Assessment: Within Defined Limits Neuro Checks:      Last Documented NIHSS Modified Score: 0 (01/14/19 0019) Has TPA been given? No If patient is a Neuro Trauma and patient is going to OR before floor call report to [facility] nurse: [phone] or [phone]     R Recommendations: See Admitting Provider  Note  Report given to:   Additional Notes: n/a

## 2019-01-14 NOTE — Progress Notes (Signed)
Pt claustrophobic, Awaiting med orders. RN paged MD. RN to notify MRI when physician calls back.

## 2019-01-14 NOTE — Consult Note (Signed)
Neurology Consultation Reason for Consult: Dysarthria Referring Physician: Hillard Danker  CC: Left sided weakness  History is obtained from: patient  HPI: Gowtham Buggy is a 63 y.o. male with a history of hypertension and diabetes who presents with slurred speech and dizziness. He mostly resolved after approximately one hour.  He states that he was reading, the first thing he noticed was that the words were blurry on the page.  He then stood up and walked done, and found that he was dizzy.  His wife noticed that he was slurring his speech and therefore called 911.  They did not activate a code stroke due to his symptoms mostly resolving, but on evaluation in the emergency department he still had mild slurred speech.  He states that he feels this is resolved at this time.   LKW: 10/11, mid afternoon tpa given?: no, mild symptoms   ROS: A 14 point ROS was performed and is negative except as noted in the HPI.   Past Medical History:  Diagnosis Date  . Diabetes mellitus without complication ([place])   . Hypertension      History reviewed. No pertinent family history.   Social History:  reports that he has never smoked. He has never used smokeless tobacco. He reports that he does not drink alcohol or use drugs.   Exam: Current vital signs: BP (!) 129/96   Pulse 62   Temp (!) 97.5 F (36.4 C) (Oral)   Resp 18   Ht 5\' 9"  (1.753 m)   Wt 81.6 kg   SpO2 97%   BMI 26.58 kg/m  Vital signs in last 24 hours: Temp:  [97.5 F (36.4 C)-98.1 F (36.7 C)] 97.5 F (36.4 C) (10/13 0448) Pulse Rate:  [58-66] 62 (10/13 0500) Resp:  [14-19] 18 (10/13 0500) BP: (129-162)/(74-96) 129/96 (10/13 0500) SpO2:  [97 %-100 %] 97 % (10/13 0500) Weight:  [81.6 kg] 81.6 kg (10/12 2113)   Physical Exam  Constitutional: Appears well-developed and well-nourished.  Psych: Affect appropriate to situation Eyes: No scleral injection HENT: No OP obstrucion Head: Normocephalic.  Cardiovascular: Normal  rate and regular rhythm.  Respiratory: Effort normal, non-labored breathing GI: Soft.  No distension. There is no tenderness.  Skin: WDI  Neuro: Mental Status: Patient is awake, alert, oriented to person, place, month, year, and situation. Patient is able to give a clear and coherent history. No signs of aphasia or neglect Cranial Nerves: II: Visual Fields are full. Pupils are equal, round, and reactive to light.   III,IV, VI: EOMI without ptosis or diploplia.  V: Facial sensation is symmetric to temperature VII: Facial movement is symmetric.  VIII: hearing is intact to voice X: Uvula elevates symmetrically XI: Shoulder shrug is symmetric. XII: tongue is midline without atrophy or fasciculations.  Motor: Tone is normal. Bulk is normal. 5/5 strength was present in all four extremities. ?  Mild right pronator drift. Sensory: Sensation is symmetric to light touch and temperature in the arms and legs. Cerebellar: FNF intact bilaterally   I have reviewed labs in epic and the results pertinent to this consultation are: Creatinine 1.4  I have reviewed the images obtained: CT head-negative  Impression: 63 year old male with transient visual change and dysarthria.  I do suspect posterior circulation transient ischemic attack.  He has been admitted for secondary risk factor modification.  Recommendations: - HgbA1c, fasting lipid panel - MRI, MRA  of the brain without contrast, MRA neck without contrast - Frequent neuro checks - Echocardiogram - Prophylactic therapy-Antiplatelet  med: Aspirin - dose 325mg  PO or 300mg  PR - Risk factor modification - Telemetry monitoring - PT consult, OT consult, Speech consult - Stroke team to follow  Roland Rack, MD [facility] [phone]  If 7pm- 7am, please page neurology on call as listed in [place].

## 2019-01-15 DIAGNOSIS — N179 Acute kidney failure, unspecified: Secondary | ICD-10-CM

## 2019-01-15 LAB — GLUCOSE, CAPILLARY
Glucose-Capillary: 214 mg/dL — ABNORMAL HIGH (ref 70–99)
Glucose-Capillary: 255 mg/dL — ABNORMAL HIGH (ref 70–99)
Glucose-Capillary: 268 mg/dL — ABNORMAL HIGH (ref 70–99)
Glucose-Capillary: 158 mg/dL — ABNORMAL HIGH (ref 70–99)

## 2019-01-15 MED ORDER — INSULIN DETEMIR 100 UNIT/ML ~~LOC~~ SOLN
12.0000 [IU] | Freq: Every day | SUBCUTANEOUS | Status: DC
  Administered 2019-01-15 – 2019-01-16 (×2): 12 [IU] via SUBCUTANEOUS
  Filled 2019-01-15 (×2): qty 0.12

## 2019-01-15 MED ORDER — INSULIN STARTER KIT- PEN NEEDLES (ENGLISH)
1.0000 | Freq: Once | Status: AC
  Administered 2019-01-15: 1
  Filled 2019-01-15: qty 1

## 2019-01-15 MED ORDER — CLOPIDOGREL BISULFATE 75 MG PO TABS
75.0000 mg | ORAL_TABLET | Freq: Every day | ORAL | Status: DC
  Administered 2019-01-15 – 2019-01-16 (×2): 75 mg via ORAL
  Filled 2019-01-15 (×2): qty 1

## 2019-01-15 NOTE — Evaluation (Signed)
Occupational Therapy Evaluation Patient Details Name: Jerry Moran MRN: [id number] DOB: 03-Oct-1955 Today's Date: 01/15/2019    History of Present Illness 63 y.o. male with acute L pons infarct. PMHx CVA in 2009, type 2 diabetes, hypertension, and hyperlipidemia.   Clinical Impression   Pt PTA: living with spouse, independently. Pt currently with no physical focal deficits; speech remains slightly slurred, but pt able to perform own ADL without assist. Pt performing mobility with independence. Pt does not require continued OT skilled services as pt at his functional baseline. OT signing off.    Follow Up Recommendations  No OT follow up    Equipment Recommendations  None recommended by OT    Recommendations for Other Services       Precautions / Restrictions Precautions Precautions: None Restrictions Weight Bearing Restrictions: No      Mobility Bed Mobility Overal bed mobility: Independent                Transfers Overall transfer level: Independent Equipment used: None                  Balance Overall balance assessment: Independent                                         ADL either performed or assessed with clinical judgement   ADL Overall ADL's : At baseline;Modified independent                                       General ADL Comments: Pt with no physical focal deficits; speech remains slightly slurred, but pt able to perform own ADL without assist.     Vision Baseline Vision/History: No visual deficits Vision Assessment?: Yes Eye Alignment: Within Functional Limits Ocular Range of Motion: Within Functional Limits Alignment/Gaze Preference: Within Defined Limits Tracking/Visual Pursuits: Able to track stimulus in all quads without difficulty     Perception     Praxis      Pertinent Vitals/Pain Pain Assessment: No/denies pain     Hand Dominance Right   Extremity/Trunk Assessment Upper  Extremity Assessment Upper Extremity Assessment: Overall WFL for tasks assessed   Lower Extremity Assessment Lower Extremity Assessment: Overall WFL for tasks assessed   Cervical / Trunk Assessment Cervical / Trunk Assessment: Normal   Communication Communication Communication: No difficulties   Cognition Arousal/Alertness: Awake/alert Behavior During Therapy: WFL for tasks assessed/performed Overall Cognitive Status: Within Functional Limits for tasks assessed                                     General Comments  No physical assist required.    Exercises     Shoulder Instructions      Home Living Family/patient expects to be discharged to:: Private residence Living Arrangements: Spouse/significant other Available Help at Discharge: Available 24 hours/day Type of Home: House Home Access: Stairs to enter [employer] of Steps: 1   Home Layout: Two level;Able to live on main level with bedroom/bathroom Alternate Level Stairs-Number of Steps: full flight   Bathroom Shower/Tub: Occupational psychologist: Standard     Home Equipment: None      Lives With: Spouse    Prior Functioning/Environment Level of Independence: Independent  Comments: driving        OT Problem List:        OT Treatment/Interventions:      OT Goals(Current goals can be found in the care plan section) Acute Rehab OT Goals Patient Stated Goal: to go home soon OT Goal Formulation: With patient  OT Frequency:     Barriers to D/C:            Co-evaluation              AM-PAC OT "6 Clicks" Daily Activity     Outcome Measure Help from another person eating meals?: None Help from another person taking care of personal grooming?: None Help from another person toileting, which includes using toliet, bedpan, or urinal?: None Help from another person bathing (including washing, rinsing, drying)?: None Help from another person to put on and  taking off regular upper body clothing?: None Help from another person to put on and taking off regular lower body clothing?: None 6 Click Score: 24   End of Session Equipment Utilized During Treatment: Gait belt Nurse Communication: Mobility status  Activity Tolerance: Patient tolerated treatment well Patient left: in chair;with call bell/phone within reach;with family/visitor present  OT Visit Diagnosis: Muscle weakness (generalized) (M62.81)                Time: [id number] OT Time Calculation (min): 22 min Charges:  OT General Charges $OT Visit: 1 Visit OT Evaluation $OT Eval Moderate Complexity: 1 Mod  Darryl Nestle) Marsa Aris OTR/L Acute Rehabilitation Services Pager: [phone] Office: [place] 01/15/2019, 5:20 PM

## 2019-01-15 NOTE — Progress Notes (Signed)
PROGRESS NOTE  Jerry Moran [id number] DOB: 1956-03-28 DOA: 01/13/2019 PCP: [facility]  HPI/Recap of past 24 hours: Jerry Moran is a 63 y.o. male with medical history significant of CVA in 2009, type 2 diabetes, hypertension hyperlipidemia who presents with concerns of slurred speech.  Wife at bedside reports that around 8 PM she noticed that patient has slurred speech.  No noted facial asymmetry.  Prior to that he reports being dizzy for about an hour.  Also noted some blurriness with reading.  He also endorses being fatigued and having generalized weakness.  Had a headache when he arrived in the ED but this has since resolved.  He denies any chest pain, shortness of breath or palpitation.  No GI symptoms including nausea, vomiting or diarrhea.  States he was otherwise in his normal state of health. Patient reports that he was started on aspirin after his previous stroke but eventually stopped taking it.  ED Course:He was afebrile mildly hypertensive up to 150s over 90s on room air.  He had bradycardia around the 50s.  CBC showed no leukocytosis or anemia.  CMP showed low sodium of 133 and creatinine of 1.42. Blood glucose of 405. CT head showed no acute intracranial hemorrhage.  There is a right basal ganglia old infarct noted. MRI  01/14/19 Acute CVA L para median pons  01/15/19: Patient was seen and examined at his bedside this morning.  No acute events overnight.  Ongoing stroke work-up.  Assessment/Plan: Active Problems:   Slurred speech   TIA (transient ischemic attack)  Acute ischemic left para median pons Seen on MRI brain Ongoing stroke palpable LDL 165 A1c 11.5 TTE EF preserved 60-65%, no report of PFO. PT OT speech therapist evaluation Old right basal ganglia infarct seen on CT head without contrast on 01/13/2019.  Not previously taking aspirin or statin Aspirin 81 mg daily Lipitor 80 mg daily  Hyperlipidemia LDL 165 Continue Lipitor 80 mg,  ordered 01/14/2019  Type 2 diabetes with hyperglycemia Last A1c 11.5 Continue insulin coverage  CKD 2 GFR 60 appears to be at his baseline Avoid nephrotoxins and monitor urine output  BPH Continue Flomax And monitor for any sign of urinary retention  DVT prophylaxis:Lovenox subcu daily Code Status:Full Family Communication: Plan discussed with patient and wife at bedside  disposition Plan:  Discharge to home when neurology signs and patient is hemodynamically stable.   Objective: Vitals:   01/14/19 1946 01/14/19 2332 01/15/19 0346 01/15/19 0848  BP: (!) 142/79 (!) 160/90 (!) 136/92 (!) 146/84  Pulse: 66 68 62 71  Resp: 16 16 18 18   Temp: 98.3 F (36.8 C) 98.1 F (36.7 C) 98 F (36.7 C) 97.8 F (36.6 C)  TempSrc: Oral  Oral Oral  SpO2: 99% 98%  97%  Weight:      Height:        Intake/Output Summary (Last 24 hours) at 01/15/2019 1035 Last data filed at 01/15/2019 0354 Gross per 24 hour  Intake 700 ml  Output 200 ml  Net 500 ml   Filed Weights   01/13/19 2113  Weight: 81.6 kg    Exam:   General: 63 y.o. year-old male well developed well nourished in no acute distress.  Alert and oriented x3.  Cardiovascular: Regular rate and rhythm with no rubs or gallops.  No thyromegaly or JVD noted.    Respiratory: Clear to auscultation with no wheezes or rales. Good inspiratory effort.  Abdomen: Soft nontender nondistended with normal bowel sounds x4 quadrants.  Musculoskeletal: No lower extremity edema. 2/4 pulses in all 4 extremities.  Psychiatry: Mood is appropriate for condition and setting   Data Reviewed: CBC: Recent Labs  Lab 01/13/19 2127  WBC 5.4  NEUTROABS 3.4  HGB 13.3  HCT 38.0*  MCV 89.2  PLT 0000000   Basic Metabolic Panel: Recent Labs  Lab 01/13/19 2127  NA 133*  K 4.1  CL 100  CO2 24  GLUCOSE 405*  BUN 14  CREATININE 1.42*  CALCIUM 8.9   GFR: Estimated Creatinine Clearance: 51.9 mL/min (A) (by C-G formula based on SCr of 1.42  mg/dL (H)). Liver Function Tests: Recent Labs  Lab 01/13/19 2127  AST 22  ALT 16  ALKPHOS 65  BILITOT 1.1  PROT 7.0  ALBUMIN 3.6   No results for input(s): LIPASE, AMYLASE in the last 168 hours. No results for input(s): AMMONIA in the last 168 hours. Coagulation Profile: Recent Labs  Lab 01/13/19 2127  INR 0.9   Cardiac Enzymes: No results for input(s): CKTOTAL, CKMB, CKMBINDEX, TROPONINI in the last 168 hours. BNP (last 3 results) No results for input(s): PROBNP in the last 8760 hours. HbA1C: Recent Labs    01/14/19 0449  HGBA1C 11.5*   CBG: Recent Labs  Lab 01/13/19 2233 01/14/19 0753 01/14/19 1225 01/14/19 2112 01/15/19 0611  GLUCAP 352* 213* 196* 280* 214*   Lipid Profile: Recent Labs    01/14/19 0449  CHOL 252*  HDL 35*  LDLCALC 165*  TRIG 258*  CHOLHDL 7.2   Thyroid Function Tests: No results for input(s): TSH, T4TOTAL, FREET4, T3FREE, THYROIDAB in the last 72 hours. Anemia Panel: No results for input(s): VITAMINB12, FOLATE, FERRITIN, TIBC, IRON, RETICCTPCT in the last 72 hours. Urine analysis:    Component Value Date/Time   COLORURINE STRAW (A) 01/13/2019 2055   APPEARANCEUR CLEAR 01/13/2019 2055   LABSPEC 1.028 01/13/2019 2055   PHURINE 6.0 01/13/2019 2055   GLUCOSEU >=500 (A) 01/13/2019 2055   [place] NEGATIVE 01/13/2019 2055   [place] 01/13/2019 2055   [place] 01/13/2019 2055   PROTEINUR 30 (A) 01/13/2019 2055   NITRITE NEGATIVE 01/13/2019 2055   LEUKOCYTESUR NEGATIVE 01/13/2019 2055   Sepsis Labs: @LABRCNTIP (procalcitonin:4,lacticidven:4)  ) Recent Results (from the past 240 hour(s))  SARS Coronavirus 2 by RT PCR (hospital order, performed in [place] hospital lab)     Status: None   Collection Time: 01/13/19 11:07 PM  Result Value Ref Range Status   SARS Coronavirus 2 NEGATIVE NEGATIVE Final    Comment: (NOTE) If result is NEGATIVE SARS-CoV-2 target nucleic acids are NOT DETECTED. The SARS-CoV-2 RNA  is generally detectable in upper and lower  respiratory specimens during the acute phase of infection. The lowest  concentration of SARS-CoV-2 viral copies this assay can detect is 250  copies / mL. A negative result does not preclude SARS-CoV-2 infection  and should not be used as the sole basis for treatment or other  patient management decisions.  A negative result may occur with  improper specimen collection / handling, submission of specimen other  than nasopharyngeal swab, presence of viral mutation(s) within the  areas targeted by this assay, and inadequate number of viral copies  (<250 copies / mL). A negative result must be combined with clinical  observations, patient history, and epidemiological information. If result is POSITIVE SARS-CoV-2 target nucleic acids are DETECTED. The SARS-CoV-2 RNA is generally detectable in upper and lower  respiratory specimens dur ing the acute phase of infection.  Positive  results are  indicative of active infection with SARS-CoV-2.  Clinical  correlation with patient history and other diagnostic information is  necessary to determine patient infection status.  Positive results do  not rule out bacterial infection or co-infection with other viruses. If result is PRESUMPTIVE POSTIVE SARS-CoV-2 nucleic acids MAY BE PRESENT.   A presumptive positive result was obtained on the submitted specimen  and confirmed on repeat testing.  While 2019 novel coronavirus  (SARS-CoV-2) nucleic acids may be present in the submitted sample  additional confirmatory testing may be necessary for epidemiological  and / or clinical management purposes  to differentiate between  SARS-CoV-2 and other Sarbecovirus currently known to infect humans.  If clinically indicated additional testing with an alternate test  methodology [phone]) is advised. The SARS-CoV-2 RNA is generally  detectable in upper and lower respiratory sp ecimens during the acute  phase of  infection. The expected result is Negative. Fact Sheet for Patients:  StrictlyIdeas.no Fact Sheet for Healthcare Providers: BankingDealers.co.za This test is not yet approved or cleared by the [place] FDA and has been authorized for detection and/or diagnosis of SARS-CoV-2 by FDA under an Emergency Use Authorization (EUA).  This EUA will remain in effect (meaning this test can be used) for the duration of the COVID-19 declaration under Section 564(b)(1) of the Act, 21 U.S.C. section 360bbb-3(b)(1), unless the authorization is terminated or revoked sooner. Performed at [facility], [place] [address] [place], [place] [address]       Studies: Mr Angio Head Wo Contrast  Result Date: 01/14/2019 CLINICAL DATA:  History of hypertension and diabetes. Acute presentation with slurred speech and dizziness. EXAM: MRI HEAD WITHOUT CONTRAST MRA HEAD WITHOUT CONTRAST MRA NECK WITHOUT CONTRAST TECHNIQUE: Multiplanar, multiecho pulse sequences of the brain and surrounding structures were obtained without intravenous contrast. Angiographic images of the Circle of Willis were obtained using MRA technique without intravenous contrast. Angiographic images of the neck were obtained using MRA technique without intravenous contrast. Carotid stenosis measurements (when applicable) are obtained utilizing NASCET criteria, using the distal internal carotid diameter as the denominator. COMPARISON:  Head CT 01/13/2019 FINDINGS: MRI HEAD FINDINGS Brain: Diffusion imaging shows a 9 mm acute infarction within the left para median pons. No other acute or subacute infarction. Brainstem otherwise shows an old small vessel infarction in the right para median pons. No focal cerebellar insult. Old small vessel infarction in the right thalamus. Cerebral hemispheres show old infarction in the right basal ganglia and in the white matter adjacent to the frontal horn  of the right lateral ventricle. There are moderate chronic small-vessel ischemic changes diffusely affecting the hemispheric deep white matter. No large vessel territory infarction. No mass lesion, hemorrhage, hydrocephalus or extra-axial collection. Vascular: Major vessels at the base of the brain show flow. Skull and upper cervical spine: Negative Sinuses/Orbits: Clear/normal Other: None MRA HEAD FINDINGS Both internal carotid arteries are patent through the skull base and siphon regions. No flow limiting siphon stenosis. The anterior and middle cerebral vessels are patent. There is a severe focal stenosis 1 of the 2 proximal left M2 branches. Distal vessels show some atherosclerotic irregularity. Both vertebral arteries are widely patent to the basilar. The basilar artery shows atherosclerotic irregularity but no flow limiting stenosis. Superior cerebellar and posterior cerebral arteries show flow. Pronounced atherosclerotic irregularity of the PCA branches, including a 50% or greater stenosis at the right P1 P2 junction. MRA NECK FINDINGS Antegrade flow in both common carotid arteries to the bifurcation regions. Allowing for  limitations of the noncontrast technique, both carotid bifurcations appear normal. No suspected stenosis. Antegrade flow in both vertebral arteries which are approximately equal in size. IMPRESSION: Acute 9 mm infarction affecting the left para median pons. Old small vessel ischemic changes of the pons, right thalamus, right basal ganglia and throughout the cerebral hemispheric white matter. Negative noncontrast MR angiography of the neck vessels. No evidence of carotid bifurcation disease. Antegrade flow in both vertebral arteries. No intracranial large or medium vessel occlusion. 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 Moderate to severe stenosis of the right posterior cerebral artery at the P1 P2 junction. Atherosclerotic irregularity of the more distal branch  vessels diffusely. Electronically Signed   By: Nelson Chimes M.D.   On: 01/14/2019 13:38   Mr Angio Neck Wo Contrast  Result Date: 01/14/2019 CLINICAL DATA:  History of hypertension and diabetes. Acute presentation with slurred speech and dizziness. EXAM: MRI HEAD WITHOUT CONTRAST MRA HEAD WITHOUT CONTRAST MRA NECK WITHOUT CONTRAST TECHNIQUE: Multiplanar, multiecho pulse sequences of the brain and surrounding structures were obtained without intravenous contrast. Angiographic images of the Circle of Willis were obtained using MRA technique without intravenous contrast. Angiographic images of the neck were obtained using MRA technique without intravenous contrast. Carotid stenosis measurements (when applicable) are obtained utilizing NASCET criteria, using the distal internal carotid diameter as the denominator. COMPARISON:  Head CT 01/13/2019 FINDINGS: MRI HEAD FINDINGS Brain: Diffusion imaging shows a 9 mm acute infarction within the left para median pons. No other acute or subacute infarction. Brainstem otherwise shows an old small vessel infarction in the right para median pons. No focal cerebellar insult. Old small vessel infarction in the right thalamus. Cerebral hemispheres show old infarction in the right basal ganglia and in the white matter adjacent to the frontal horn of the right lateral ventricle. There are moderate chronic small-vessel ischemic changes diffusely affecting the hemispheric deep white matter. No large vessel territory infarction. No mass lesion, hemorrhage, hydrocephalus or extra-axial collection. Vascular: Major vessels at the base of the brain show flow. Skull and upper cervical spine: Negative Sinuses/Orbits: Clear/normal Other: None MRA HEAD FINDINGS Both internal carotid arteries are patent through the skull base and siphon regions. No flow limiting siphon stenosis. The anterior and middle cerebral vessels are patent. There is a severe focal stenosis 1 of the 2 proximal left M2  branches. Distal vessels show some atherosclerotic irregularity. Both vertebral arteries are widely patent to the basilar. The basilar artery shows atherosclerotic irregularity but no flow limiting stenosis. Superior cerebellar and posterior cerebral arteries show flow. Pronounced atherosclerotic irregularity of the PCA branches, including a 50% or greater stenosis at the right P1 P2 junction. MRA NECK FINDINGS Antegrade flow in both common carotid arteries to the bifurcation regions. Allowing for limitations of the noncontrast technique, both carotid bifurcations appear normal. No suspected stenosis. Antegrade flow in both vertebral arteries which are approximately equal in size. IMPRESSION: Acute 9 mm infarction affecting the left para median pons. Old small vessel ischemic changes of the pons, right thalamus, right basal ganglia and throughout the cerebral hemispheric white matter. Negative noncontrast MR angiography of the neck vessels. No evidence of carotid bifurcation disease. Antegrade flow in both vertebral arteries. No intracranial large or medium vessel occlusion. 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 Moderate to severe stenosis of the right posterior cerebral artery at the P1 P2 junction. Atherosclerotic irregularity of the more distal branch vessels diffusely. Electronically  Signed   By: Nelson Chimes M.D.   On: 01/14/2019 13:38   Mr Brain Wo Contrast  Result Date: 01/14/2019 CLINICAL DATA:  History of hypertension and diabetes. Acute presentation with slurred speech and dizziness. EXAM: MRI HEAD WITHOUT CONTRAST MRA HEAD WITHOUT CONTRAST MRA NECK WITHOUT CONTRAST TECHNIQUE: Multiplanar, multiecho pulse sequences of the brain and surrounding structures were obtained without intravenous contrast. Angiographic images of the Circle of Willis were obtained using MRA technique without intravenous contrast. Angiographic images of the neck were obtained using MRA technique without  intravenous contrast. Carotid stenosis measurements (when applicable) are obtained utilizing NASCET criteria, using the distal internal carotid diameter as the denominator. COMPARISON:  Head CT 01/13/2019 FINDINGS: MRI HEAD FINDINGS Brain: Diffusion imaging shows a 9 mm acute infarction within the left para median pons. No other acute or subacute infarction. Brainstem otherwise shows an old small vessel infarction in the right para median pons. No focal cerebellar insult. Old small vessel infarction in the right thalamus. Cerebral hemispheres show old infarction in the right basal ganglia and in the white matter adjacent to the frontal horn of the right lateral ventricle. There are moderate chronic small-vessel ischemic changes diffusely affecting the hemispheric deep white matter. No large vessel territory infarction. No mass lesion, hemorrhage, hydrocephalus or extra-axial collection. Vascular: Major vessels at the base of the brain show flow. Skull and upper cervical spine: Negative Sinuses/Orbits: Clear/normal Other: None MRA HEAD FINDINGS Both internal carotid arteries are patent through the skull base and siphon regions. No flow limiting siphon stenosis. The anterior and middle cerebral vessels are patent. There is a severe focal stenosis 1 of the 2 proximal left M2 branches. Distal vessels show some atherosclerotic irregularity. Both vertebral arteries are widely patent to the basilar. The basilar artery shows atherosclerotic irregularity but no flow limiting stenosis. Superior cerebellar and posterior cerebral arteries show flow. Pronounced atherosclerotic irregularity of the PCA branches, including a 50% or greater stenosis at the right P1 P2 junction. MRA NECK FINDINGS Antegrade flow in both common carotid arteries to the bifurcation regions. Allowing for limitations of the noncontrast technique, both carotid bifurcations appear normal. No suspected stenosis. Antegrade flow in both vertebral arteries which  are approximately equal in size. IMPRESSION: Acute 9 mm infarction affecting the left para median pons. Old small vessel ischemic changes of the pons, right thalamus, right basal ganglia and throughout the cerebral hemispheric white matter. Negative noncontrast MR angiography of the neck vessels. No evidence of carotid bifurcation disease. Antegrade flow in both vertebral arteries. No intracranial large or medium vessel occlusion. 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 Moderate to severe stenosis of the right posterior cerebral artery at the P1 P2 junction. Atherosclerotic irregularity of the more distal branch vessels diffusely. Electronically Signed   By: Nelson Chimes M.D.   On: 01/14/2019 13:38    Scheduled Meds:   stroke: mapping our early stages of recovery book   Does not apply Once   acetaminophen  650 mg Oral Once   aspirin EC  81 mg Oral Daily   atorvastatin  80 mg Oral q1800   enoxaparin (LOVENOX) injection  40 mg Subcutaneous Q24H   insulin aspart  0-15 Units Subcutaneous TID WC   tamsulosin  0.4 mg Oral Daily    Continuous Infusions:  sodium chloride 1,000 mL (01/15/19 0801)     LOS: 1 day     Kayleen Memos, MD [facility] Pager [phone]  If 7PM-7AM, please  contact night-coverage [email or web address] Password [email or web address] 01/15/2019, 10:35 AM

## 2019-01-15 NOTE — Evaluation (Signed)
Speech Language Pathology Evaluation Patient Details Name: Jerry Moran MRN: [id number] DOB: 1955-06-09 Today's Date: 01/15/2019 Time: 1100-1130 SLP Time Calculation (min) (ACUTE ONLY): 30 min  Problem List:  Patient Active Problem List   Diagnosis Date Noted  . Slurred speech 01/14/2019  . TIA (transient ischemic attack) 01/14/2019   Past Medical History:  Past Medical History:  Diagnosis Date  . Diabetes mellitus without complication ([place])   . Hypertension    Past Surgical History: History reviewed. No pertinent surgical history. HPI:  63yo male admitted 1013/2020 with dysarthria and dizziness, fatigue and generalized weakness. PMH: CVA (2009), DM2, HTN, HLD. HeadCT = no acute finding, old right BG infarct. MRI = Acute 9 mm infarction affecting the left paramedian pons.   Assessment / Plan / Recommendation Clinical Impression  Pt presents with minimal dysarthria. CN exam is unremarkable, however, pt and wife report slight dysarthria in conversation. Pt was encouraged to decrease rate of speech and overarticulate. SLP provided written exercises to increase strength of speech musculature. Pt cognition and language appear within fucntional limits. Pt and wife report pt is at baseline for cognitive linguistics. No further ST intervention recommended.    SLP Assessment  SLP Recommendation/Assessment: Patient does not need any further Speech Language Pathology Services SLP Visit Diagnosis: Dysarthria and anarthria (R47.1)    Follow Up Recommendations  None       SLP Evaluation Cognition  Overall Cognitive Status: Within Functional Limits for tasks assessed Orientation Level: Oriented X4       Comprehension  Auditory Comprehension Overall Auditory Comprehension: Appears within functional limits for tasks assessed    Expression Expression Primary Mode of Expression: Verbal Verbal Expression Overall Verbal Expression: Appears within functional limits for tasks  assessed Written Expression Dominant Hand: Right   Oral / Motor  Oral Motor/Sensory Function Overall Oral Motor/Sensory Function: Mild impairment Motor Speech Overall Motor Speech: Impaired Respiration: Within functional limits Phonation: Normal Resonance: Within functional limits Articulation: Impaired Level of Impairment: Conversation Intelligibility: Intelligible Motor Planning: Witnin functional limits Motor Speech Errors: Aware Effective Techniques: Slow rate;Over-articulate   GO                   Jerry Moran B. Quentin Ore, [facility], [place] Speech Language Pathologist Office: [phone] Pager: [phone]  Shonna Chock 01/15/2019, 11:31 AM

## 2019-01-15 NOTE — Evaluation (Signed)
Physical Therapy Evaluation Patient Details Name: Jerry Moran MRN: [id number] DOB: 1955/12/19 Today's Date: 01/15/2019   History of Present Illness  63 y.o. male with medical history significant of CVA in 2009, type 2 diabetes, hypertension hyperlipidemia who presents with concerns of slurred speech, blurred vision, and dizziness.  Clinical Impression  Pt presents to PT without significant deficits in functional mobility or gait, performing all mobility required in the home setting independently or modI. Pt reports vision and speech deficits have resolved, able to read signs in the hallways and speaking clearly this session. Pt also denies dizziness during all mobility. Pt requires no further acute PT services and is encouraged to ambulate around the unit multiple times per day. Acute PT signing off.    Follow Up Recommendations No PT follow up    Equipment Recommendations  None recommended by PT    Recommendations for Other Services       Precautions / Restrictions Precautions Precautions: None Restrictions Weight Bearing Restrictions: No      Mobility  Bed Mobility Overal bed mobility: Independent                Transfers Overall transfer level: Independent Equipment used: None                Ambulation/Gait Ambulation/Gait assistance: Independent Gait Distance (Feet): 300 Feet Assistive device: None Gait Pattern/deviations: Step-through pattern Gait velocity: functional Gait velocity interpretation: >2.62 ft/sec, indicative of community ambulatory General Gait Details: pt with slight increase in lateral sway initially but progresses to more normalized gait with increased ambulation distances  Stairs Stairs: Yes Stairs assistance: Modified independent (Device/Increase time) Stair Management: One rail Right Number of Stairs: 2    Wheelchair Mobility    Modified Rankin (Stroke Patients Only) Modified Rankin (Stroke Patients Only) Pre-Morbid  Rankin Score: No symptoms Modified Rankin: No symptoms     Balance Overall balance assessment: Independent                                           Pertinent Vitals/Pain Pain Assessment: No/denies pain    Home Living Family/patient expects to be discharged to:: Private residence Living Arrangements: Spouse/significant other Available Help at Discharge: Available 24 hours/day Type of Home: House Home Access: Stairs to enter Entrance Stairs-Rails: None Entrance Stairs-Number of Steps: 1 Home Layout: Two level;Able to live on main level with bedroom/bathroom Home Equipment: None      Prior Function Level of Independence: Independent(driving)               Hand Dominance   Dominant Hand: Right    Extremity/Trunk Assessment   Upper Extremity Assessment Upper Extremity Assessment: Overall WFL for tasks assessed    Lower Extremity Assessment Lower Extremity Assessment: Overall WFL for tasks assessed    Cervical / Trunk Assessment Cervical / Trunk Assessment: Normal  Communication   Communication: No difficulties  Cognition Arousal/Alertness: Awake/alert Behavior During Therapy: WFL for tasks assessed/performed Overall Cognitive Status: Within Functional Limits for tasks assessed                                        General Comments      Exercises     Assessment/Plan    PT Assessment Patent does not need any further PT services  PT Problem  List         PT Treatment Interventions      PT Goals (Current goals can be found in the Care Plan section)       Frequency     Barriers to discharge        Co-evaluation               AM-PAC PT "6 Clicks" Mobility  Outcome Measure Help needed turning from your back to your side while in a flat bed without using bedrails?: None Help needed moving from lying on your back to sitting on the side of a flat bed without using bedrails?: None Help needed moving to and  from a bed to a chair (including a wheelchair)?: None Help needed standing up from a chair using your arms (e.g., wheelchair or bedside chair)?: None Help needed to walk in hospital room?: None Help needed climbing 3-5 steps with a railing? : None 6 Click Score: 24    End of Session Equipment Utilized During Treatment: (none) Activity Tolerance: Patient tolerated treatment well Patient left: in chair;with call bell/phone within reach Nurse Communication: Mobility status PT Visit Diagnosis: Other abnormalities of gait and mobility (R26.89)    [id number] PT Time Calculation (min) (ACUTE ONLY): 16 min   Charges:   PT Evaluation $PT Eval Low Complexity: Stryker, PT, DPT Acute Rehabilitation Pager: [phone]   Zenaida Niece 01/15/2019, 10:31 AM

## 2019-01-15 NOTE — Progress Notes (Addendum)
Inpatient Diabetes Program Recommendations  AACE/ADA: New Consensus Statement on Inpatient Glycemic Control (2015)  Target Ranges:  Prepandial:   less than 140 mg/dL      Peak postprandial:   less than 180 mg/dL (1-2 hours)      Critically ill patients:  140 - 180 mg/dL   Lab Results  Component Value Date   GLUCAP 214 (H) 01/15/2019   HGBA1C 11.5 (H) 01/14/2019    Review of Glycemic Control Results for Jerry Moran, Jerry Moran (MRN [id number]) as of 01/15/2019 10:42  Ref. Range 01/14/2019 07:53 01/14/2019 12:25 01/14/2019 21:12 01/15/2019 06:11  Glucose-Capillary Latest Ref Range: 70 - 99 mg/dL 213 (H) 196 (H) 280 (H) 214 (H)   Admit with: Dysarthria/ L sided weakness  History: DM, CVA (2009), HTN  Home DM Meds: Metformin 500 mg QID  Current Orders: Novolog Moderate Correction Scale/ SSI (0-15 units) TID AC   Consider adding Levemir 12 units QD.  Addendum:  Spoke with patient regarding outpatient diabetes management. Patient is seen by the [facility] in [place] for primary care. Reviewed patient's current A1c of 11.5%. Explained what a A1c is and what it measures. Also reviewed goal A1c with patient, importance of good glucose control @ home, and blood sugar goals. Reviewed patho of DM, need for insulin, role of pancreas, differences between long acting vs short acting, current inpatient glucose trends, survival skills, interventions, vascular changes and commorbidities.  Patient will need a meter. Glucose meter kit (Includes lancets and strips) (#[id number]). Encouraged to begin checking at least 2-3 times per day and taking results with him to next PCP visit. Discussed when to call MD and patient was able to states interventions. Reviewed the importance of avoiding sugary beverages, goal setting, carb counting, and importance of being mindful with carb intake at meals. Admits to drinking large amount of sweet tea at home. Reviewed alternatives and potential impact this could have on A1C to  avoidance. Dietitian and outpatient referral placed.  Educated patient and spouse on insulin pen use at home. Reviewed contents of insulin flexpen starter kit. Reviewed all steps if insulin pen including attachment of needle, 2-unit air shot, dialing up dose, giving injection, removing needle, disposal of sharps, storage of unused insulin, disposal of insulin etc. Patient able to provide successful return demonstration. Also reviewed troubleshooting with insulin pen. MD to give patient Rxs for insulin pens and insulin pen needles. Patient has no further questions at this time and plans to follow up with the [place].  Thanks, Bronson Curb, MSN, RNC-OB Diabetes Coordinator [phone] (8a-5p)

## 2019-01-15 NOTE — Progress Notes (Signed)
STROKE TEAM PROGRESS NOTE   INTERVAL HISTORY Jerry Moran wife is at the bedside.  Jerry Moran is sitting up in the chair.  I have personally reviewed history of presenting illness with the patient and Jerry Moran wife, reviewed electronic medical records and imaging films in PACS.  Jerry Moran states Jerry Moran dizziness is improved but speech is not back to normal yet.  MRI scan of the brain shows a left pontine paramedian infarct.  MRI of the brain and neck show only mild atherosclerotic changes without large vessel stenosis or occlusion.  Echocardiogram shows normal ejection fraction without cardiac source of embolism.  LDL cholesterol is elevated at 165 mg percent and hemoglobin A1c at 11.5.  Vitals:   01/14/19 1946 01/14/19 2332 01/15/19 0346 01/15/19 0848  BP: (!) 142/79 (!) 160/90 (!) 136/92 (!) 146/84  Pulse: 66 68 62 71  Resp: 16 16 18 18   Temp: 98.3 F (36.8 C) 98.1 F (36.7 C) 98 F (36.7 C) 97.8 F (36.6 C)  TempSrc: Oral  Oral Oral  SpO2: 99% 98%  97%  Weight:      Height:        CBC:  Recent Labs  Lab 01/13/19 2127  WBC 5.4  NEUTROABS 3.4  HGB 13.3  HCT 38.0*  MCV 89.2  PLT 0000000    Basic Metabolic Panel:  Recent Labs  Lab 01/13/19 2127  NA 133*  K 4.1  CL 100  CO2 24  GLUCOSE 405*  BUN 14  CREATININE 1.42*  CALCIUM 8.9   Lipid Panel:     Component Value Date/Time   CHOL 252 (H) 01/14/2019 0449   TRIG 258 (H) 01/14/2019 0449   HDL 35 (L) 01/14/2019 0449   CHOLHDL 7.2 01/14/2019 0449   VLDL 52 (H) 01/14/2019 0449   LDLCALC 165 (H) 01/14/2019 0449   HgbA1c:  Lab Results  Component Value Date   HGBA1C 11.5 (H) 01/14/2019   Urine Drug Screen:     Component Value Date/Time   LABOPIA NONE DETECTED 01/13/2019 2055   COCAINSCRNUR NONE DETECTED 01/13/2019 2055   LABBENZ NONE DETECTED 01/13/2019 2055   AMPHETMU NONE DETECTED 01/13/2019 2055   THCU NONE DETECTED 01/13/2019 2055   LABBARB NONE DETECTED 01/13/2019 2055    Alcohol Level     Component Value Date/Time   ETH <10  01/13/2019 2127    IMAGING Ct Head Wo Contrast  Result Date: 01/13/2019 CLINICAL DATA:  63 year old male with slurred speech and focal neurologic deficit. EXAM: CT HEAD WITHOUT CONTRAST TECHNIQUE: Contiguous axial images were obtained from the base of the skull through the vertex without intravenous contrast. COMPARISON:  None. FINDINGS: Brain: There is mild age-related atrophy and chronic microvascular ischemic changes. 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 There is no acute intracranial hemorrhage. No mass effect or midline shift. No extra-axial fluid collection. Vascular: No hyperdense vessel or unexpected calcification. Skull: Normal. Negative for fracture or focal lesion. Sinuses/Orbits: No acute finding. Other: Right frontal scalp scarring. IMPRESSION: 1. No acute intracranial hemorrhage. 2. Mild age-related atrophy and chronic microvascular ischemic changes. Old right basal ganglia infarct. Electronically Signed   By: Anner Crete M.D.   On: 01/13/2019 21:25   Mr Angio Head Wo Contrast  Result Date: 01/14/2019 CLINICAL DATA:  History of hypertension and diabetes. Acute presentation with slurred speech and dizziness. EXAM: MRI HEAD WITHOUT CONTRAST MRA HEAD WITHOUT CONTRAST MRA NECK WITHOUT CONTRAST TECHNIQUE: Multiplanar, multiecho pulse sequences of the brain and surrounding structures were obtained without intravenous contrast. Angiographic images of the  Circle of Willis were obtained using MRA technique without intravenous contrast. Angiographic images of the neck were obtained using MRA technique without intravenous contrast. Carotid stenosis measurements (when applicable) are obtained utilizing NASCET criteria, using the distal internal carotid diameter as the denominator. COMPARISON:  Head CT 01/13/2019 FINDINGS: MRI HEAD FINDINGS Brain: Diffusion imaging shows a 9 mm acute infarction within the left para median pons. No other acute or subacute infarction. Brainstem otherwise shows an  old small vessel infarction in the right para median pons. No focal cerebellar insult. Old small vessel infarction in the right thalamus. Cerebral hemispheres show old infarction in the right basal ganglia and in the white matter adjacent to the frontal horn of the right lateral ventricle. There are moderate chronic small-vessel ischemic changes diffusely affecting the hemispheric deep white matter. No large vessel territory infarction. No mass lesion, hemorrhage, hydrocephalus or extra-axial collection. Vascular: Major vessels at the base of the brain show flow. Skull and upper cervical spine: Negative Sinuses/Orbits: Clear/normal Other: None MRA HEAD FINDINGS Both internal carotid arteries are patent through the skull base and siphon regions. No flow limiting siphon stenosis. The anterior and middle cerebral vessels are patent. There is a severe focal stenosis 1 of the 2 proximal left M2 branches. Distal vessels show some atherosclerotic irregularity. Both vertebral arteries are widely patent to the basilar. The basilar artery shows atherosclerotic irregularity but no flow limiting stenosis. Superior cerebellar and posterior cerebral arteries show flow. Pronounced atherosclerotic irregularity of the PCA branches, including a 50% or greater stenosis at the right P1 P2 junction. MRA NECK FINDINGS Antegrade flow in both common carotid arteries to the bifurcation regions. Allowing for limitations of the noncontrast technique, both carotid bifurcations appear normal. No suspected stenosis. Antegrade flow in both vertebral arteries which are approximately equal in size. IMPRESSION: Acute 9 mm infarction affecting the left para median pons. Old small vessel ischemic changes of the pons, right thalamus, right basal ganglia and throughout the cerebral hemispheric white matter. Negative noncontrast MR angiography of the neck vessels. No evidence of carotid bifurcation disease. Antegrade flow in both vertebral arteries. No  intracranial large or medium vessel occlusion. 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 Moderate to severe stenosis of the right posterior cerebral artery at the P1 P2 junction. Atherosclerotic irregularity of the more distal branch vessels diffusely. Electronically Signed   By: Nelson Chimes M.D.   On: 01/14/2019 13:38   Mr Angio Neck Wo Contrast  Result Date: 01/14/2019 CLINICAL DATA:  History of hypertension and diabetes. Acute presentation with slurred speech and dizziness. EXAM: MRI HEAD WITHOUT CONTRAST MRA HEAD WITHOUT CONTRAST MRA NECK WITHOUT CONTRAST TECHNIQUE: Multiplanar, multiecho pulse sequences of the brain and surrounding structures were obtained without intravenous contrast. Angiographic images of the Circle of Willis were obtained using MRA technique without intravenous contrast. Angiographic images of the neck were obtained using MRA technique without intravenous contrast. Carotid stenosis measurements (when applicable) are obtained utilizing NASCET criteria, using the distal internal carotid diameter as the denominator. COMPARISON:  Head CT 01/13/2019 FINDINGS: MRI HEAD FINDINGS Brain: Diffusion imaging shows a 9 mm acute infarction within the left para median pons. No other acute or subacute infarction. Brainstem otherwise shows an old small vessel infarction in the right para median pons. No focal cerebellar insult. Old small vessel infarction in the right thalamus. Cerebral hemispheres show old infarction in the right basal ganglia and in the white matter adjacent to the frontal horn of the  right lateral ventricle. There are moderate chronic small-vessel ischemic changes diffusely affecting the hemispheric deep white matter. No large vessel territory infarction. No mass lesion, hemorrhage, hydrocephalus or extra-axial collection. Vascular: Major vessels at the base of the brain show flow. Skull and upper cervical spine: Negative Sinuses/Orbits: Clear/normal Other:  None MRA HEAD FINDINGS Both internal carotid arteries are patent through the skull base and siphon regions. No flow limiting siphon stenosis. The anterior and middle cerebral vessels are patent. There is a severe focal stenosis 1 of the 2 proximal left M2 branches. Distal vessels show some atherosclerotic irregularity. Both vertebral arteries are widely patent to the basilar. The basilar artery shows atherosclerotic irregularity but no flow limiting stenosis. Superior cerebellar and posterior cerebral arteries show flow. Pronounced atherosclerotic irregularity of the PCA branches, including a 50% or greater stenosis at the right P1 P2 junction. MRA NECK FINDINGS Antegrade flow in both common carotid arteries to the bifurcation regions. Allowing for limitations of the noncontrast technique, both carotid bifurcations appear normal. No suspected stenosis. Antegrade flow in both vertebral arteries which are approximately equal in size. IMPRESSION: Acute 9 mm infarction affecting the left para median pons. Old small vessel ischemic changes of the pons, right thalamus, right basal ganglia and throughout the cerebral hemispheric white matter. Negative noncontrast MR angiography of the neck vessels. No evidence of carotid bifurcation disease. Antegrade flow in both vertebral arteries. No intracranial large or medium vessel occlusion. 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 Moderate to severe stenosis of the right posterior cerebral artery at the P1 P2 junction. Atherosclerotic irregularity of the more distal branch vessels diffusely. Electronically Signed   By: Nelson Chimes M.D.   On: 01/14/2019 13:38   Mr Brain Wo Contrast  Result Date: 01/14/2019 CLINICAL DATA:  History of hypertension and diabetes. Acute presentation with slurred speech and dizziness. EXAM: MRI HEAD WITHOUT CONTRAST MRA HEAD WITHOUT CONTRAST MRA NECK WITHOUT CONTRAST TECHNIQUE: Multiplanar, multiecho pulse sequences of the  brain and surrounding structures were obtained without intravenous contrast. Angiographic images of the Circle of Willis were obtained using MRA technique without intravenous contrast. Angiographic images of the neck were obtained using MRA technique without intravenous contrast. Carotid stenosis measurements (when applicable) are obtained utilizing NASCET criteria, using the distal internal carotid diameter as the denominator. COMPARISON:  Head CT 01/13/2019 FINDINGS: MRI HEAD FINDINGS Brain: Diffusion imaging shows a 9 mm acute infarction within the left para median pons. No other acute or subacute infarction. Brainstem otherwise shows an old small vessel infarction in the right para median pons. No focal cerebellar insult. Old small vessel infarction in the right thalamus. Cerebral hemispheres show old infarction in the right basal ganglia and in the white matter adjacent to the frontal horn of the right lateral ventricle. There are moderate chronic small-vessel ischemic changes diffusely affecting the hemispheric deep white matter. No large vessel territory infarction. No mass lesion, hemorrhage, hydrocephalus or extra-axial collection. Vascular: Major vessels at the base of the brain show flow. Skull and upper cervical spine: Negative Sinuses/Orbits: Clear/normal Other: None MRA HEAD FINDINGS Both internal carotid arteries are patent through the skull base and siphon regions. No flow limiting siphon stenosis. The anterior and middle cerebral vessels are patent. There is a severe focal stenosis 1 of the 2 proximal left M2 branches. Distal vessels show some atherosclerotic irregularity. Both vertebral arteries are widely patent to the basilar. The basilar artery shows atherosclerotic irregularity but no flow limiting stenosis. Superior cerebellar  and posterior cerebral arteries show flow. Pronounced atherosclerotic irregularity of the PCA branches, including a 50% or greater stenosis at the right P1 P2 junction.  MRA NECK FINDINGS Antegrade flow in both common carotid arteries to the bifurcation regions. Allowing for limitations of the noncontrast technique, both carotid bifurcations appear normal. No suspected stenosis. Antegrade flow in both vertebral arteries which are approximately equal in size. IMPRESSION: Acute 9 mm infarction affecting the left para median pons. Old small vessel ischemic changes of the pons, right thalamus, right basal ganglia and throughout the cerebral hemispheric white matter. Negative noncontrast MR angiography of the neck vessels. No evidence of carotid bifurcation disease. Antegrade flow in both vertebral arteries. No intracranial large or medium vessel occlusion. 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 Moderate to severe stenosis of the right posterior cerebral artery at the P1 P2 junction. Atherosclerotic irregularity of the more distal branch vessels diffusely. Electronically Signed   By: Nelson Chimes M.D.   On: 01/14/2019 13:38    PHYSICAL EXAM Pleasant middle-aged male currently not in distress. . Afebrile. Head is nontraumatic. Neck is supple without bruit.    Cardiac exam no murmur or gallop. Lungs are clear to auscultation. Distal pulses are well felt. Neurological Exam : Awake  Alert oriented x 3. Normal speech and language.eye movements full without nystagmus.fundi were not visualized. Vision acuity and fields appear normal. Hearing is normal. Palatal movements are normal. Face symmetric. Tongue midline. Normal strength, tone, reflexes and coordination.  Diminished fine finger movements on the right.  Orbits left over right upper extremity.  Normal sensation. Gait deferred.  ASSESSMENT/PLAN Jerry Moran is a 63 y.o. male with history of HTN and DB presenting with L sided weakness, dizzy when walking slurred speech.   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 old R basal ganglia  infarct. No hemorrhage. Small vessel disease. Atrophy.   MRI  L paramedian pontine infarct.  Old small vessel disease changes pons, R thalamus, R basal ganglia and cerebral white matter.  MRA  Head severe stenosis origin L M2 branches. Moderate to severe stenosis R PCA at P1/P2 jxn.   MRA  Neck Unremarkable   2D Echo EF 60-65%. No source of embolus   LDL 165  HgbA1c 11.5  Lovenox 40 mg sq daily for VTE prophylaxis  No antithrombotic prior to admission, now on aspirin 81 mg daily. Given mild stroke, recommend aspirin 81 mg and plavix 75 mg daily x 3 weeks, then aspirin alone. Orders adjusted.   Therapy recommendations:  No PT   Disposition:  Return home Follow-up Stroke Clinic at [facility] in 4 weeks. Office will call with appointment date and time. Order placed.  Hypertension  Stable . Permissive hypertension (OK if < 220/120) but gradually normalize in 5-7 days . Long-term BP goal normotensive  Hyperlipidemia  Home meds:  No statin  Now on lipitor 80  LDL 165, goal < 70  Continue statin at discharge  Diabetes type II Uncontrolled  HgbA1c 11.5, goal < 7.0  DB coordinator following  Other Stroke Risk Factors  Advanced age  Other Active Problems  CKD stage II  BPH  Hospital day # 1  I have personally obtained history,examined this patient, reviewed notes, independently viewed imaging studies, participated in medical decision making and plan of care.ROS completed by me personally and pertinent positives fully documented  I have made any  additions or clarifications directly to the above note.  Jerry Moran presented with dizziness and speech difficulties due to left pontine lacunar infarct from small vessel disease.  I recommend aspirin and Plavix for 3 weeks followed by aspirin alone and aggressive risk factor modification.  Statins for elevated lipids and strict diabetes control.  Follow-up as an outpatient stroke clinic in 6 weeks.  Discussed with  Dr. [place] Crane.  Greater than 50% time during this 25-minute visit was spent on counseling and coordination of care about Jerry Moran lacunar stroke and answering questions.  Stroke team will sign off.  Kindly call for questions.  Antony Contras, MD Medical Director [facility] Pager: [phone] 01/15/2019 6:35 PM   To contact Stroke Continuity provider, please refer to [email or web address] After hours, contact General Neurology

## 2019-01-16 LAB — BASIC METABOLIC PANEL
Anion gap: 9 (ref 5–15)
BUN: 7 mg/dL — ABNORMAL LOW (ref 8–23)
CO2: 20 mmol/L — ABNORMAL LOW (ref 22–32)
Calcium: 8.1 mg/dL — ABNORMAL LOW (ref 8.9–10.3)
Chloride: 107 mmol/L (ref 98–111)
Creatinine, Ser: 0.98 mg/dL (ref 0.61–1.24)
GFR calc Af Amer: >60 mL/min (ref >60)
GFR calc non Af Amer: >60 mL/min (ref >60)
Glucose, Bld: 158 mg/dL — ABNORMAL HIGH (ref 70–99)
Potassium: 3.4 mmol/L — ABNORMAL LOW (ref 3.5–5.1)
Sodium: 136 mmol/L (ref 135–145)

## 2019-01-16 LAB — GLUCOSE, CAPILLARY
Glucose-Capillary: 148 mg/dL — ABNORMAL HIGH (ref 70–99)
Glucose-Capillary: 205 mg/dL — ABNORMAL HIGH (ref 70–99)

## 2019-01-16 MED ORDER — LISINOPRIL 10 MG PO TABS: 10.0000 mg | ORAL_TABLET | Freq: Every day | ORAL | 0 refills | Status: DC

## 2019-01-16 MED ORDER — INSULIN DETEMIR 100 UNIT/ML FLEXPEN: 12.0000 [IU] | PEN_INJECTOR | Freq: Every day | SUBCUTANEOUS | 0 refills | Status: DC

## 2019-01-16 MED ORDER — LISINOPRIL 10 MG PO TABS
10.0000 mg | ORAL_TABLET | Freq: Every day | ORAL | Status: DC
  Administered 2019-01-16: 10 mg via ORAL
  Filled 2019-01-16: qty 1

## 2019-01-16 MED ORDER — BLOOD GLUCOSE MONITOR KIT: PACK | 0 refills | Status: AC

## 2019-01-16 MED ORDER — METFORMIN HCL 500 MG PO TABS: 1000.0000 mg | ORAL_TABLET | Freq: Two times a day (BID) | ORAL | 0 refills | Status: DC

## 2019-01-16 MED ORDER — INSULIN PEN NEEDLE 32G X 8 MM MISC: 50.0000 | Freq: Every morning | 0 refills | Status: DC

## 2019-01-16 MED ORDER — AMLODIPINE BESYLATE 10 MG PO TABS
10.0000 mg | ORAL_TABLET | Freq: Every day | ORAL | Status: DC
  Administered 2019-01-16: 10 mg via ORAL
  Filled 2019-01-16: qty 1

## 2019-01-16 MED ORDER — POTASSIUM CHLORIDE CRYS ER 20 MEQ PO TBCR
40.0000 meq | EXTENDED_RELEASE_TABLET | Freq: Once | ORAL | Status: AC
  Administered 2019-01-16: 40 meq via ORAL
  Filled 2019-01-16: qty 2

## 2019-01-16 MED ORDER — AMLODIPINE BESYLATE 10 MG PO TABS: 10.0000 mg | ORAL_TABLET | Freq: Every day | ORAL | 0 refills | Status: AC

## 2019-01-16 MED ORDER — CLOPIDOGREL BISULFATE 75 MG PO TABS: 75.0000 mg | ORAL_TABLET | Freq: Every day | ORAL | 0 refills | Status: AC

## 2019-01-16 MED ORDER — STROKE: EARLY STAGES OF RECOVERY BOOK: 1.0000 | Freq: Once | 0 refills | Status: AC

## 2019-01-16 MED ORDER — ATORVASTATIN CALCIUM 80 MG PO TABS: 80.0000 mg | ORAL_TABLET | Freq: Every day | ORAL | 0 refills | Status: DC

## 2019-01-16 MED ORDER — ASPIRIN 81 MG PO TBEC: 81.0000 mg | DELAYED_RELEASE_TABLET | Freq: Every day | ORAL | 0 refills | Status: AC

## 2019-01-16 NOTE — Discharge Summary (Signed)
Discharge Summary  Jerry Moran HQI:[id number] DOB: [birth year]  PCP: [facility]  Admit date: 01/13/2019 Discharge date: 01/16/2019  Time spent: 35 minutes   Recommendations for Outpatient Follow-up:  1. Follow-up with neurology 2. Follow-up with your PCP 3. Take your medications as prescribed  Recommendations per neurology: Recommend aspirin 81 mg and plavix 75 mg daily x 3 weeks, then aspirin alone. Also continue Lipitor at discharge.  Discharge Diagnoses:  Active Hospital Problems   Diagnosis Date Noted   Slurred speech 01/14/2019   TIA (transient ischemic attack) 01/14/2019    Resolved Hospital Problems  No resolved problems to display.    Discharge Condition: Stable  Diet recommendation: Heart healthy carb modified diet  Vitals:   01/16/19 0856 01/16/19 1226  BP: (!) 162/82 (!) 173/92  Pulse: 73 61  Resp: 20 20  Temp: 98.5 F (36.9 C) 98.4 F (36.9 C)  SpO2: 98% 98%    History of present illness:  Jerry Jones-Beyis a 63 y.o.malewith medical history significant ofCVA in 2009, type 2 diabetes on metformin, hypertension hyperlipidemia who presents with concerns of slurred speech. Wife at bedside reports that around 8 PM she noticed that patient has slurred speech. No noted facial asymmetry. Prior to that he reports being dizzy for about an hour. Also noted some blurriness with reading. He also endorses being fatigued and having generalized weakness. Had a headache when he arrived in the ED but this has since resolved. He denies any chest pain, shortness of breath or palpitation. No GI symptoms including nausea, vomiting or diarrhea. States he was otherwise in his normal state of health. Patient reports that he was started on aspirin after his previous stroke but eventually stopped taking it.  ED Course: He was afebrile mildly hypertensive up to 150s over 90s on room air. He had bradycardia around the 50s. CBC showed no leukocytosis  or anemia. CMP showed low sodium of 133 and creatinine of 1.42. Blood glucose of 405. CT head showed no acute intracranial hemorrhage. There is a right basal ganglia old infarct noted. MRI  01/14/19 Acute CVA L para median pons.  Seen by neurology, completed stroke work-up. MRI scan of the brain shows a left pontine paramedian infarct.  MRI of the brain and neck show mild atherosclerotic changes without large vessel stenosis or occlusion.  Echocardiogram shows normal ejection fraction without cardiac source of embolism.  LDL cholesterol is elevated at 165 mg percent and hemoglobin A1c at 11.5.   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 old R basal ganglia infarct. No hemorrhage. Small vessel disease. Atrophy.   MRI  L paramedian pontine infarct.  Old small vessel disease changes pons, R thalamus, R basal ganglia and cerebral white matter.  MRA  Head severe stenosis origin L M2 branches. Moderate to severe stenosis R PCA at P1/P2 jxn.   MRA  Neck Unremarkable   2D Echo EF 60-65%. No source of embolus   LDL 165  HgbA1c 11.5  Lovenox 40 mg sq daily for VTE prophylaxis  No antithrombotic prior to admission, now on aspirin 81 mg daily. Given mild stroke, recommend aspirin 81 mg and plavix 75 mg daily x 3 weeks, then aspirin alone. Orders adjusted.   Therapy recommendations:  No PT   Disposition:  Return home  Follow-up Stroke Clinic at [facility] in 4 weeks. Office will call with appointment date and time. Order placed.  01/16/19: Patient was seen  and examined at his bedside this morning.  No acute events overnight.  He has no new complaints.  Patient is hemodynamically stable for discharge.  He will need to follow-up with neurology and his primary care provider posthospitalization.  He will also need to take his medications as prescribed.    Hospital Course:  Active Problems:   Slurred speech    TIA (transient ischemic attack)  Acute ischemic left para median pons Seen on MRI brain LDL 165 A1c 11.5 TTE EF preserved 60-65% PT OT speech therapist evaluation>> no further recommendations. Old right basal ganglia infarct seen on CT head without contrast on 01/13/2019.  Not previously taking aspirin or statin Aspirin 81 mg daily, Plavix 75 mg daily x3 weeks then aspirin alone, Lipitor 80 mg daily  Hyperlipidemia LDL 165 Continue Lipitor 80 mg, ordered 01/14/2019 Follow-up with your PCP  Type 2 diabetes with hyperglycemia Last A1c 11.5 Continue Levemir 12 units daily Continue metformin 1000 mg twice daily Follow-up with your PCP  Resolved AKI Presented with creatinine of 1.4 with GFR of 60 Creatinine 0.9 on 01/16/2019 with GFR greater than 60 Continue to avoid nephrotoxins like NSAIDs  BPH Continue Flomax    Code Status:Full    Discharge Exam: BP (!) 173/92 (BP Location: Left Arm)    Pulse 61    Temp 98.4 F (36.9 C) (Oral)    Resp 20    Ht _0  (1.753 m)    Wt 81.6 kg    SpO2 98%    BMI 26.58 kg/m   General: 63 y.o. year-old male well developed well nourished in no acute distress.  Alert and oriented x4.  Cardiovascular: Regular rate and rhythm with no rubs or gallops.  No thyromegaly or JVD noted.    Respiratory: Clear to auscultation with no wheezes or rales. Good inspiratory effort.  Abdomen: Soft nontender nondistended with normal bowel sounds x4 quadrants.  Musculoskeletal: No lower extremity edema. 2/4 pulses in all 4 extremities.  Psychiatry: Mood is appropriate for condition and setting  Discharge Instructions You were cared for by a hospitalist during your hospital stay. If you have any questions about your discharge medications or the care you received while you were in the hospital after you are discharged, you can call the unit and asked to speak with the hospitalist on call if the hospitalist that took care of you is not available. Once you  are discharged, your primary care physician will handle any further medical issues. Please note that NO REFILLS for any discharge medications will be authorized once you are discharged, as it is imperative that you return to your primary care physician (or establish a relationship with a primary care physician if you do not have one) for your aftercare needs so that they can reassess your need for medications and monitor your lab values.  Discharge Instructions    Ambulatory referral to Neurology   Complete by: As directed    Follow up in stroke clinic at [facility] with Frann Rider, NP in about 4 weeks. If not available, consider Dr. Antony Contras, Dr. Bess Harvest, or Dr. Sarina Ill.   Ambulatory referral to Nutrition and Diabetic Education   Complete by: As directed      Allergies as of 01/16/2019   No Known Allergies     Medication List    TAKE these medications    stroke: mapping our early stages of recovery book Misc 1 each by Does not apply route once for 1 dose.  amLODipine 10 MG tablet Commonly known as: NORVASC Take 1 tablet (10 mg total) by mouth daily.   aspirin 81 MG EC tablet Take 1 tablet (81 mg total) by mouth daily. Start taking on: January 17, 2019   atorvastatin 80 MG tablet Commonly known as: LIPITOR Take 1 tablet (80 mg total) by mouth daily at 6 PM.   blood glucose meter kit and supplies Kit Dispense based on patient and insurance preference. Use up to four times daily as directed. (FOR ICD-9 250.00, 250.01).   clopidogrel 75 MG tablet Commonly known as: PLAVIX Take 1 tablet (75 mg total) by mouth daily for 21 days. Start taking on: January 17, 2019   Insulin Detemir 100 UNIT/ML Pen Commonly known as: LEVEMIR Inject 12 Units into the skin daily.   Insulin Pen Needle 32G X 8 MM Misc 50 each by Does not apply route every morning.   lisinopril 10 MG tablet Commonly known as: ZESTRIL Take 1 tablet (10 mg total) by mouth  daily. What changed:   medication strength  how much to take   metFORMIN 500 MG tablet Commonly known as: GLUCOPHAGE Take 2 tablets (1,000 mg total) by mouth 2 (two) times daily with a meal. What changed:   how much to take  when to take this   tamsulosin 0.4 MG Caps capsule Commonly known as: FLOMAX Take 0.4 mg by mouth daily.      No Known Allergies Follow-up Information    [facility] Follow up in 4 week(s).   Specialty: Neurology Why: stroke clinic. office will call with appt date and time.  Contact information: [address] [place] [facility], [place] Call in 1 day(s).   Why: Please call for a post hospital follow-up appointment. Contact information: [place] [address] [phone]            The results of significant diagnostics from this hospitalization (including imaging, microbiology, ancillary and laboratory) are listed below for reference.    Significant Diagnostic Studies: Ct Head Wo Contrast  Result Date: 01/13/2019 CLINICAL DATA:  63 year old male with slurred speech and focal neurologic deficit. EXAM: CT HEAD WITHOUT CONTRAST TECHNIQUE: Contiguous axial images were obtained from the base of the skull through the vertex without intravenous contrast. COMPARISON:  None. FINDINGS: Brain: There is mild age-related atrophy and chronic microvascular ischemic changes. 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 There is no acute intracranial hemorrhage. No mass effect or midline shift. No extra-axial fluid collection. Vascular: No hyperdense vessel or unexpected calcification. Skull: Normal. Negative for fracture or focal lesion. Sinuses/Orbits: No acute finding. Other: Right frontal scalp scarring. IMPRESSION: 1. No acute intracranial hemorrhage. 2. Mild age-related atrophy and chronic microvascular ischemic changes. Old right basal ganglia  infarct. Electronically Signed   By: Anner Crete M.D.   On: 01/13/2019 21:25   Mr Angio Head Wo Contrast  Result Date: 01/14/2019 CLINICAL DATA:  History of hypertension and diabetes. Acute presentation with slurred speech and dizziness. EXAM: MRI HEAD WITHOUT CONTRAST MRA HEAD WITHOUT CONTRAST MRA NECK WITHOUT CONTRAST TECHNIQUE: Multiplanar, multiecho pulse sequences of the brain and surrounding structures were obtained without intravenous contrast. Angiographic images of the Circle of Willis were obtained using MRA technique without intravenous contrast. Angiographic images of the neck were obtained using MRA technique without intravenous contrast. Carotid stenosis measurements (when applicable) are obtained utilizing NASCET criteria, using the distal internal carotid diameter as the denominator.  COMPARISON:  Head CT 01/13/2019 FINDINGS: MRI HEAD FINDINGS Brain: Diffusion imaging shows a 9 mm acute infarction within the left para median pons. No other acute or subacute infarction. Brainstem otherwise shows an old small vessel infarction in the right para median pons. No focal cerebellar insult. Old small vessel infarction in the right thalamus. Cerebral hemispheres show old infarction in the right basal ganglia and in the white matter adjacent to the frontal horn of the right lateral ventricle. There are moderate chronic small-vessel ischemic changes diffusely affecting the hemispheric deep white matter. No large vessel territory infarction. No mass lesion, hemorrhage, hydrocephalus or extra-axial collection. Vascular: Major vessels at the base of the brain show flow. Skull and upper cervical spine: Negative Sinuses/Orbits: Clear/normal Other: None MRA HEAD FINDINGS Both internal carotid arteries are patent through the skull base and siphon regions. No flow limiting siphon stenosis. The anterior and middle cerebral vessels are patent. There is a severe focal stenosis 1 of the 2 proximal left M2  branches. Distal vessels show some atherosclerotic irregularity. Both vertebral arteries are widely patent to the basilar. The basilar artery shows atherosclerotic irregularity but no flow limiting stenosis. Superior cerebellar and posterior cerebral arteries show flow. Pronounced atherosclerotic irregularity of the PCA branches, including a 50% or greater stenosis at the right P1 P2 junction. MRA NECK FINDINGS Antegrade flow in both common carotid arteries to the bifurcation regions. Allowing for limitations of the noncontrast technique, both carotid bifurcations appear normal. No suspected stenosis. Antegrade flow in both vertebral arteries which are approximately equal in size. IMPRESSION: Acute 9 mm infarction affecting the left para median pons. Old small vessel ischemic changes of the pons, right thalamus, right basal ganglia and throughout the cerebral hemispheric white matter. Negative noncontrast MR angiography of the neck vessels. No evidence of carotid bifurcation disease. Antegrade flow in both vertebral arteries. No intracranial large or medium vessel occlusion. 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 Moderate to severe stenosis of the right posterior cerebral artery at the P1 P2 junction. Atherosclerotic irregularity of the more distal branch vessels diffusely. Electronically Signed   By: Nelson Chimes M.D.   On: 01/14/2019 13:38   Mr Angio Neck Wo Contrast  Result Date: 01/14/2019 CLINICAL DATA:  History of hypertension and diabetes. Acute presentation with slurred speech and dizziness. EXAM: MRI HEAD WITHOUT CONTRAST MRA HEAD WITHOUT CONTRAST MRA NECK WITHOUT CONTRAST TECHNIQUE: Multiplanar, multiecho pulse sequences of the brain and surrounding structures were obtained without intravenous contrast. Angiographic images of the Circle of Willis were obtained using MRA technique without intravenous contrast. Angiographic images of the neck were obtained using MRA technique  without intravenous contrast. Carotid stenosis measurements (when applicable) are obtained utilizing NASCET criteria, using the distal internal carotid diameter as the denominator. COMPARISON:  Head CT 01/13/2019 FINDINGS: MRI HEAD FINDINGS Brain: Diffusion imaging shows a 9 mm acute infarction within the left para median pons. No other acute or subacute infarction. Brainstem otherwise shows an old small vessel infarction in the right para median pons. No focal cerebellar insult. Old small vessel infarction in the right thalamus. Cerebral hemispheres show old infarction in the right basal ganglia and in the white matter adjacent to the frontal horn of the right lateral ventricle. There are moderate chronic small-vessel ischemic changes diffusely affecting the hemispheric deep white matter. No large vessel territory infarction. No mass lesion, hemorrhage, hydrocephalus or extra-axial collection. Vascular: Major vessels at the base of the brain show flow. Skull and  upper cervical spine: Negative Sinuses/Orbits: Clear/normal Other: None MRA HEAD FINDINGS Both internal carotid arteries are patent through the skull base and siphon regions. No flow limiting siphon stenosis. The anterior and middle cerebral vessels are patent. There is a severe focal stenosis 1 of the 2 proximal left M2 branches. Distal vessels show some atherosclerotic irregularity. Both vertebral arteries are widely patent to the basilar. The basilar artery shows atherosclerotic irregularity but no flow limiting stenosis. Superior cerebellar and posterior cerebral arteries show flow. Pronounced atherosclerotic irregularity of the PCA branches, including a 50% or greater stenosis at the right P1 P2 junction. MRA NECK FINDINGS Antegrade flow in both common carotid arteries to the bifurcation regions. Allowing for limitations of the noncontrast technique, both carotid bifurcations appear normal. No suspected stenosis. Antegrade flow in both vertebral  arteries which are approximately equal in size. IMPRESSION: Acute 9 mm infarction affecting the left para median pons. Old small vessel ischemic changes of the pons, right thalamus, right basal ganglia and throughout the cerebral hemispheric white matter. Negative noncontrast MR angiography of the neck vessels. No evidence of carotid bifurcation disease. Antegrade flow in both vertebral arteries. No intracranial large or medium vessel occlusion. 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 Moderate to severe stenosis of the right posterior cerebral artery at the P1 P2 junction. Atherosclerotic irregularity of the more distal branch vessels diffusely. Electronically Signed   By: Nelson Chimes M.D.   On: 01/14/2019 13:38   Mr Brain Wo Contrast  Result Date: 01/14/2019 CLINICAL DATA:  History of hypertension and diabetes. Acute presentation with slurred speech and dizziness. EXAM: MRI HEAD WITHOUT CONTRAST MRA HEAD WITHOUT CONTRAST MRA NECK WITHOUT CONTRAST TECHNIQUE: Multiplanar, multiecho pulse sequences of the brain and surrounding structures were obtained without intravenous contrast. Angiographic images of the Circle of Willis were obtained using MRA technique without intravenous contrast. Angiographic images of the neck were obtained using MRA technique without intravenous contrast. Carotid stenosis measurements (when applicable) are obtained utilizing NASCET criteria, using the distal internal carotid diameter as the denominator. COMPARISON:  Head CT 01/13/2019 FINDINGS: MRI HEAD FINDINGS Brain: Diffusion imaging shows a 9 mm acute infarction within the left para median pons. No other acute or subacute infarction. Brainstem otherwise shows an old small vessel infarction in the right para median pons. No focal cerebellar insult. Old small vessel infarction in the right thalamus. Cerebral hemispheres show old infarction in the right basal ganglia and in the white matter adjacent to the  frontal horn of the right lateral ventricle. There are moderate chronic small-vessel ischemic changes diffusely affecting the hemispheric deep white matter. No large vessel territory infarction. No mass lesion, hemorrhage, hydrocephalus or extra-axial collection. Vascular: Major vessels at the base of the brain show flow. Skull and upper cervical spine: Negative Sinuses/Orbits: Clear/normal Other: None MRA HEAD FINDINGS Both internal carotid arteries are patent through the skull base and siphon regions. No flow limiting siphon stenosis. The anterior and middle cerebral vessels are patent. There is a severe focal stenosis 1 of the 2 proximal left M2 branches. Distal vessels show some atherosclerotic irregularity. Both vertebral arteries are widely patent to the basilar. The basilar artery shows atherosclerotic irregularity but no flow limiting stenosis. Superior cerebellar and posterior cerebral arteries show flow. Pronounced atherosclerotic irregularity of the PCA branches, including a 50% or greater stenosis at the right P1 P2 junction. MRA NECK FINDINGS Antegrade flow in both common carotid arteries to the bifurcation regions. Allowing for limitations of  the noncontrast technique, both carotid bifurcations appear normal. No suspected stenosis. Antegrade flow in both vertebral arteries which are approximately equal in size. IMPRESSION: Acute 9 mm infarction affecting the left para median pons. Old small vessel ischemic changes of the pons, right thalamus, right basal ganglia and throughout the cerebral hemispheric white matter. Negative noncontrast MR angiography of the neck vessels. No evidence of carotid bifurcation disease. Antegrade flow in both vertebral arteries. No intracranial large or medium vessel occlusion. 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 Moderate to severe stenosis of the right posterior cerebral artery at the P1 P2 junction. Atherosclerotic irregularity of the more distal  branch vessels diffusely. Electronically Signed   By: Nelson Chimes M.D.   On: 01/14/2019 13:38    Microbiology: Recent Results (from the past 240 hour(s))  SARS Coronavirus 2 by RT PCR (hospital order, performed in [place] hospital lab)     Status: None   Collection Time: 01/13/19 11:07 PM  Result Value Ref Range Status   SARS Coronavirus 2 NEGATIVE NEGATIVE Final    Comment: (NOTE) If result is NEGATIVE SARS-CoV-2 target nucleic acids are NOT DETECTED. The SARS-CoV-2 RNA is generally detectable in upper and lower  respiratory specimens during the acute phase of infection. The lowest  concentration of SARS-CoV-2 viral copies this assay can detect is 250  copies / mL. A negative result does not preclude SARS-CoV-2 infection  and should not be used as the sole basis for treatment or other  patient management decisions.  A negative result may occur with  improper specimen collection / handling, submission of specimen other  than nasopharyngeal swab, presence of viral mutation(s) within the  areas targeted by this assay, and inadequate number of viral copies  (<250 copies / mL). A negative result must be combined with clinical  observations, patient history, and epidemiological information. If result is POSITIVE SARS-CoV-2 target nucleic acids are DETECTED. The SARS-CoV-2 RNA is generally detectable in upper and lower  respiratory specimens dur ing the acute phase of infection.  Positive  results are indicative of active infection with SARS-CoV-2.  Clinical  correlation with patient history and other diagnostic information is  necessary to determine patient infection status.  Positive results do  not rule out bacterial infection or co-infection with other viruses. If result is PRESUMPTIVE POSTIVE SARS-CoV-2 nucleic acids MAY BE PRESENT.   A presumptive positive result was obtained on the submitted specimen  and confirmed on repeat testing.  While 2019 novel coronavirus   (SARS-CoV-2) nucleic acids may be present in the submitted sample  additional confirmatory testing may be necessary for epidemiological  and / or clinical management purposes  to differentiate between  SARS-CoV-2 and other Sarbecovirus currently known to infect humans.  If clinically indicated additional testing with an alternate test  methodology [phone]) is advised. The SARS-CoV-2 RNA is generally  detectable in upper and lower respiratory sp ecimens during the acute  phase of infection. The expected result is Negative. Fact Sheet for Patients:  StrictlyIdeas.no Fact Sheet for Healthcare Providers: BankingDealers.co.za This test is not yet approved or cleared by the [place] FDA and has been authorized for detection and/or diagnosis of SARS-CoV-2 by FDA under an Emergency Use Authorization (EUA).  This EUA will remain in effect (meaning this test can be used) for the duration of the COVID-19 declaration under Section 564(b)(1) of the Act, 21 U.S.C. section 360bbb-3(b)(1), unless the authorization is terminated or revoked sooner. Performed at [facility]  [place] [address] [place], [place] [address]      Labs: Basic Metabolic Panel: Recent Labs  Lab 01/13/19 2127 01/16/19 0643  NA 133* 136  K 4.1 3.4*  CL 100 107  CO2 24 20*  GLUCOSE 405* 158*  BUN 14 7*  CREATININE 1.42* 0.98  CALCIUM 8.9 8.1*   Liver Function Tests: Recent Labs  Lab 01/13/19 2127  AST 22  ALT 16  ALKPHOS 65  BILITOT 1.1  PROT 7.0  ALBUMIN 3.6   No results for input(s): LIPASE, AMYLASE in the last 168 hours. No results for input(s): AMMONIA in the last 168 hours. CBC: Recent Labs  Lab 01/13/19 2127  WBC 5.4  NEUTROABS 3.4  HGB 13.3  HCT 38.0*  MCV 89.2  PLT 176   Cardiac Enzymes: No results for input(s): CKTOTAL, CKMB, CKMBINDEX, TROPONINI in the last 168 hours. BNP: BNP (last 3 results) No results for  input(s): BNP in the last 8760 hours.  ProBNP (last 3 results) No results for input(s): PROBNP in the last 8760 hours.  CBG: Recent Labs  Lab 01/15/19 0611 01/15/19 1159 01/15/19 1619 01/15/19 2206 01/16/19 0630  GLUCAP 214* 255* 268* 158* 148*       Signed:  Kayleen Memos, MD [facility] 01/16/2019, 12:31 PM

## 2019-01-16 NOTE — Discharge Instructions (Addendum)
Eating Plan After Stroke A stroke causes damage to the brain cells, which can affect your ability to walk, talk, and even eat. The impact of a stroke is different for everyone, and so is recovery. A good nutrition plan is important for your recovery. It can also lower your risk of another stroke. If you have difficulty chewing and swallowing your food, a dietitian or your stroke care team can help so that you can enjoy eating healthy foods. What are tips for following this plan?  Reading food labels  Choose foods that have less than 300 milligrams (mg) of sodium per serving. Limit your sodium intake to less than 1,500 mg per day.  Avoid foods that have saturated fat and trans fat.  Choose foods that are low in cholesterol. Limit the amount of cholesterol you eat each day to less than 200 mg.  Choose foods that are high in fiber. Eat 20-30 grams (g) of fiber each day.  Avoid foods with added sugar. Check the food label for ingredients such as sugar, corn syrup, honey, fructose, molasses, and cane juice. Shopping  At the grocery store, buy most of your food from areas near the walls of the store. This includes: ? Fresh fruits and vegetables. ? Dry grains, beans, nuts, and seeds. ? Fresh seafood, poultry, lean meats, and eggs. ? Low-fat dairy products.  Buy whole ingredients instead of prepackaged foods.  Buy fresh, in-season fruits and vegetables from local farmers markets.  Buy frozen fruits and vegetables in resealable bags. Cooking  Prepare foods with very little salt. Use herbs or salt-free spices instead.  Cook with heart-healthy oils, such as olive, avocado, canola, soybean, or sunflower oil.  Avoid frying foods. Bake, grill, or broil foods instead.  Remove visible fat and skin from meat and poultry before eating.  Modify food textures as told by your health care provider. Meal planning  Eat a wide variety of colorful fruits and vegetables. Make sure one-half of your  plate is filled with fruits and vegetables at each meal.  Eat fruits and vegetables that are high in potassium, such as: ? Apples, bananas, oranges, and melon. ? Sweet potatoes, spinach, zucchini, and tomatoes.  Eat fish that contain heart-healthy fats (omega-3 fats) at least twice a week. These include salmon, tuna, mackerel, and sardines.  Eat plant foods that are high in omega-3 fats, such as flaxseeds and walnuts. Add these to cereals, yogurt, or pasta dishes.  Eat several servings of high-fiber foods each day, such as fruits, vegetables, whole grains, and beans.  Do not put salt at the table for meals.  When eating out at restaurants: ? Ask the server about low-salt or salt-free food options. ? Avoid fried foods. Look for menu items that are grilled, steamed, broiled, or roasted. ? Ask if your food can be prepared without butter. ? Ask for condiments, such as salad dressings, gravy, or sauces to be served on the side.  If you have difficulty swallowing: ? Choose foods that are softer and easier to chew and swallow. ? Cut foods into small pieces and chew well before swallowing. ? Thicken liquids as told by your health care provider or dietitian. ? Let your health care provider know if your condition does not improve over time. You may need to work with a speech therapist to re-train the muscles that are used for eating. General recommendations  Involve your family and friends in your recovery, if possible. It may be helpful to have a slower meal time  and to plan meals that include foods everyone in the family can eat.  Brush your teeth with fluoride toothpaste twice a day, and floss once a day. Keeping a clean mouth can help you swallow and can also help your appetite.  Drink enough water each day to keep your urine pale yellow. If needed, set reminders or ask your family to help you remember to drink water.  Limit alcohol intake to no more than 1 drink a day for nonpregnant  women and 2 drinks a day for men. One drink equals 12 oz of beer, 5 oz of wine, or 1 oz of hard liquor. Summary  Following this eating plan can help in your stroke recovery and can decrease your risk for another stroke.  Let your health care provider know if you have problems with swallowing. You may need to work with a speech therapist. This information is not intended to replace advice given to you by your health care provider. Make sure you discuss any questions you have with your health care provider. Document Released: 05/28/2017 Document Revised: 07/11/2018 Document Reviewed: 05/28/2017 Elsevier Patient Education  2020 [place] After a Stroke After a stroke, you may have various problems with thinking (cognitive disability). The types of problems you have will depend on how severe the stroke was and where it was located in the brain. Problems may include:  Problems with short-term memory.  Trouble paying attention.  Trouble communicating or understanding language (aphasia).  A drop in mental ability that may interfere with daily life (dementia).  Trouble with problem-solving and information processing.  Problems with reading, writing, or math.  Problems with your ability to plan and to perform activities in sequence (executive function). These problems can feel overwhelming. However, with rehabilitation and time to heal, many people have improvement in their symptoms. What causes cognitive disability? A stroke happens when blood cannot flow to certain areas of the brain. When this happens, brain cells die in the affected areas because they cannot get oxygen and nutrients from the blood. Cognitive disability is caused by the death of cells in the areas of the brain that control thinking. What is cognitive rehabilitation? Cognitive rehabilitation is a program to help you improve your thinking skills after a stroke. Rehabilitation cannot completely  reverse the effects of a stroke, but it can help you with memory, problem-solving, and communication skills. Therapy focuses on:  Improving brain function. This may involve activities such as learning to break down tasks into simple steps.  Helping you learn ways to cope with thinking problems. For example, you might learn memory tricks or do activities that stimulate memory, such as naming objects or describing pictures. Cognitive rehabilitation may include:  Speech-language therapy to help you understand and use language to communicate.  Occupational therapy to help you perform daily activities.  Music therapy to help relieve stress, anxiety, and depression. This may involve listening to music, singing, or playing instruments.  Physical therapy to help improve your ability to move and perform actions that involve the muscles (motor functions). When will therapy start and where will I have therapy? Your health care provider will decide when it is best for you to start therapy. In some cases, people start rehabilitation as soon as their health is stable, which may be 24-48 hours after the stroke. Rehabilitation can take place in a few different places, based on your needs. It may take place in:  The hospital or an in-patient rehabilitation hospital.  An outpatient rehabilitation facility.  A long-term care facility.  A community rehabilitation clinic.  Your home. What are some tools to help after a stroke? There are a number of tools and apps that you can use on your smartphone, personal computer, or tablet to help improve brain function. Some of these apps include:  Calendar reminders or alarm apps to help with memory.  Note-taking or sketch pad apps to help with memory or communication.  Text-to-speech apps that allow you to listen to what you are reading, which helps your ability to understanding text.  Picture dictionary or picture message apps to help with  communication.  E-readers. These can highlight text as it is read aloud, which helps with listening and reading skills. How can my friends or family help during my rehabilitation? During your recovery, it is important that your friends and family members help you work toward more independence. Your caregivers should speak with your health care providers to learn how they can best help you during recovery. This may include working on speech-language or memory exercises at home, or helping with daily tasks and errands. If you have cognitive disability, you may be at risk for injury or accidents at home, such as forgetting to turn off the stove. Friends and family members can help ensure home safety by taking steps such as getting appliances with automatic shut-off features or storing dangerous objects in a secure place. What else should I know about cognitive rehabilitation after a stroke? Having trouble with memory and problem-solving can make you feel alone. You may also have mood changes, anxiety, or depression after a stroke. It is important to:  Stay connected with others through social groups, online support groups, or your community.  Talk to your friends, family, and caregivers about any emotional problems you are having.  Go to one-on-one or group therapy as suggested by your health care provider.  Stay physically active and exercise as often as suggested by your health care provider. Summary  After a stroke, some people have problems with thinking that affect attention, memory, language, communication, and problem-solving.  Cognitive rehabilitation is a program to help you regain brain function and learn skills to cope with thinking problems.  Rehabilitation cannot completely reverse the effects of a stroke, but it can help to improve quality of life.  Cognitive rehabilitation may include speech-language therapy, occupational therapy, music therapy, and physical therapy. This  information is not intended to replace advice given to you by your health care provider. Make sure you discuss any questions you have with your health care provider. Document Released: 06/23/2016 Document Revised: 07/10/2018 Document Reviewed: 06/23/2016 Elsevier Patient Education  [place].   Hyperglycemia Hyperglycemia is when the sugar (glucose) level in your blood is too high. It may not cause symptoms. If you do have symptoms, they may include warning signs, such as:  Feeling more thirsty than normal.  Hunger.  Feeling tired.  Needing to pee (urinate) more than normal.  Blurry eyesight (vision). You may get other symptoms as it gets worse, such as:  Dry mouth.  Not being hungry (loss of appetite).  Fruity-smelling breath.  Weakness.  Weight gain or loss that is not planned. Weight loss may be fast.  A tingling or numb feeling in your hands or feet.  Headache.  Skin that does not bounce back quickly when it is lightly pinched and released (poor skin turgor).  Pain in your belly (abdomen).  Cuts or bruises that heal slowly. High blood  sugar can happen to people who do or do not have diabetes. High blood sugar can happen slowly or quickly, and it can be an emergency. Follow these instructions at home: General instructions  Take over-the-counter and prescription medicines only as told by your doctor.  Do not use products that contain nicotine or tobacco, such as cigarettes and e-cigarettes. If you need help quitting, ask your doctor.  Limit alcohol intake to no more than 1 drink per day for nonpregnant women and 2 drinks per day for men. One drink equals 12 oz of beer, 5 oz of wine, or 1 oz of hard liquor.  Manage stress. If you need help with this, ask your doctor.  Keep all follow-up visits as told by your doctor. This is important. Eating and drinking   Stay at a healthy weight.  Exercise regularly, as told by your doctor.  Drink enough fluid,  especially when you: ? Exercise. ? Get sick. ? Are in hot temperatures.  Eat healthy foods, such as: ? Low-fat (lean) proteins. ? Complex carbs (complex carbohydrates), such as whole wheat bread or brown rice. ? Fresh fruits and vegetables. ? Low-fat dairy products. ? Healthy fats.  Drink enough fluid to keep your pee (urine) clear or pale yellow. If you have diabetes:   Make sure you know the symptoms of hyperglycemia.  Follow your diabetes management plan, as told by your doctor. Make sure you: ? Take insulin and medicines as told. ? Follow your exercise plan. ? Follow your meal plan. Eat on time. Do not skip meals. ? Check your blood sugar as often as told. Make sure to check before and after exercise. If you exercise longer or in a different way than you normally do, check your blood sugar more often. ? Follow your sick day plan whenever you cannot eat or drink normally. Make this plan ahead of time with your doctor.  Share your diabetes management plan with people in your workplace, school, and household.  Check your urine for ketones when you are ill and as told by your doctor.  Carry a card or wear jewelry that says that you have diabetes. Contact a doctor if:  Your blood sugar level is higher than 240 mg/dL (13.3 mmol/L) for 2 days in a row.  You have problems keeping your blood sugar in your target range.  High blood sugar happens often for you. Get help right away if:  You have trouble breathing.  You have a change in how you think, feel, or act (mental status).  You feel sick to your stomach (nauseous), and that feeling does not go away.  You cannot stop throwing up (vomiting). These symptoms may be an emergency. Do not wait to see if the symptoms will go away. Get medical help right away. Call your local emergency services (911 in the [place] Do not drive yourself to the hospital. Summary  Hyperglycemia is when the sugar (glucose) level in your blood is too  high.  High blood sugar can happen to people who do or do not have diabetes.  Make sure you drink enough fluids, eat healthy foods, and exercise regularly.  Contact your doctor if you have problems keeping your blood sugar in your target range. This information is not intended to replace advice given to you by your health care provider. Make sure you discuss any questions you have with your health care provider. Document Released: 01/15/2009 Document Revised: 12/06/2015 Document Reviewed: 12/06/2015 Elsevier Patient Education  2020 [employer].  Ischemic Stroke  An ischemic stroke is the sudden death of brain tissue. Blood carries oxygen to all areas of the body. This type of stroke happens when your blood does not flow to your brain like normal. Your brain cannot get the oxygen it needs. This is an emergency. It must be treated right away. Symptoms of a stroke usually happen all of a sudden. You may notice them when you wake up. They can include:  Weakness or loss of feeling in your face, arm, or leg. This often happens on one side of the body.  Trouble walking.  Trouble moving your arms or legs.  Loss of balance or coordination.  Feeling confused.  Trouble talking or understanding what people are saying.  Slurred speech.  Trouble seeing.  Seeing two of one object (double vision).  Feeling dizzy.  Feeling sick to your stomach (nauseous) and throwing up (vomiting).  A very bad headache for no reason. Get help as soon as any of these problems start. This is important. Some treatments work better if they are given right away. These include:  Aspirin.  Medicines to control blood pressure.  A shot (injection) of medicine to break up the blood clot.  Treatments given in the blood vessel (artery) to take out the clot or break it up. Other treatments may include:  Oxygen.  Fluids given through an IV tube.  Medicines to thin out your blood.  Procedures to help your  blood flow better. What increases the risk? Certain things may make you more likely to have a stroke. Some of these are things that you can change, such as:  Being very overweight (obesity).  Smoking.  Taking birth control pills.  Not being active.  Drinking too much alcohol.  Using drugs. Other risk factors include:  High blood pressure.  High cholesterol.  Diabetes.  Heart disease.  Being [place] American, Native American, Hispanic, or [place] Native.  Being over age 27.  Family history of stroke.  Having had blood clots, stroke, or warning stroke (transient ischemic attack, TIA) in the past.  Sickle cell disease.  Being a woman with a history of high blood pressure in pregnancy (preeclampsia).  Migraine headache.  Sleep apnea.  Having an irregular heartbeat (atrial fibrillation).  Long-term (chronic) diseases that cause soreness and swelling (inflammation).  Disorders that affect how your blood clots. Follow these instructions at home: Medicines  Take over-the-counter and prescription medicines only as told by your doctor.  If you were told to take aspirin or another medicine to thin your blood, take it exactly as told by your doctor. ? Taking too much of the medicine can cause bleeding. ? If you do not take enough, it may not work as well.  Know the side effects of your medicines. If you are taking a blood thinner, make sure you: ? Hold pressure over any cuts for longer than usual. ? Tell your dentist and other doctors that you take this medicine. ? Avoid activities that may cause damage or injury to your body. Eating and drinking  Follow instructions from your doctor about what you cannot eat or drink.  Eat healthy foods.  If you have trouble with swallowing, do these things to avoid choking: ? Take small bites when eating. ? Eat foods that are soft or pureed. Safety  Follow instructions from your health care team about physical  activity.  Use a walker or cane as told by your doctor.  Keep your home safe so you do not  fall. This may include: ? Having experts look at your home to make sure it is safe. ? Putting grab bars in the bedroom and bathroom. ? Using raised toilets. ? Putting a seat in the shower. General instructions  Do not use any tobacco products. ? Examples of these are cigarettes, chewing tobacco, and e-cigarettes. ? If you need help quitting, ask your doctor.  Limit how much alcohol you drink. This means no more than 1 drink a day for nonpregnant women and 2 drinks a day for men. One drink equals 12 oz of beer, 5 oz of wine, or 1 oz of hard liquor.  If you need help to stop using drugs or alcohol, ask your doctor to refer you to a program or specialist.  Stay active. Exercise as told by your doctor.  Keep all follow-up visits as told by your doctor. This is important. Get help right away if:   You have any signs of a stroke. "BE FAST" is an easy way to remember the main warning signs: ? B - Balance. Signs are dizziness, sudden trouble walking, or loss of balance. ? E - Eyes. Signs are trouble seeing or a change in how you see. ? F - Face. Signs are sudden weakness or loss of feeling of the face, or the face or eyelid drooping on one side. ? A - Arms. Signs are weakness or loss of feeling in an arm. This happens suddenly and usually on one side of the body. ? S - Speech. Signs are sudden trouble speaking, slurred speech, or trouble understanding what people say. ? T - Time. Time to call emergency services. Write down what time symptoms started.  You have other signs of a stroke, such as: ? A sudden, very bad headache with no known cause. ? Feeling sick to your stomach (nausea). ? Throwing up (vomiting). ? Jerky movements you cannot control (seizure). These symptoms may be an emergency. Do not wait to see if the symptoms will go away. Get medical help right away. Call your local emergency  services (911 in the [place] Do not drive yourself to the hospital. Summary  An ischemic stroke is the sudden death of brain tissue.  Symptoms of a stroke usually happen all of a sudden. You may notice them when you wake up.  Get help if you have any warning signs of a stroke. This is important. Some treatments work better if they are given right away. This information is not intended to replace advice given to you by your health care provider. Make sure you discuss any questions you have with your health care provider. Document Released: 03/09/2011 Document Revised: 08/29/2017 Document Reviewed: 06/16/2015 Elsevier Patient Education  [place] After a Stroke Caregivers provide essential physical and emotional support to people who have had a stroke. If you are supporting someone who has had a stroke, you play an important role in coordinating schedules, helping your loved one to communicate, and helping with the overall rehabilitation plan. What do I need to know about my loved one's recovery? Recovering from a stroke can take weeks, months, or years. Some people may be able to return to a normal lifestyle, and other people may have permanent problems with movement (mobility), thinking, behavior, or communication. Understanding your loved one's condition can help you manage the role of caregiver. Your loved one's health care team may rely on you to provide information such as medical history and current medicines. How can I  support my friend or family member? Planning for discharge from the hospital Ask to meet with a social worker or a stroke care coordinator, if one is available. This person can help you to plan for discharge. Before you leave the hospital, make sure you understand:  The recovery process after a stroke.  Physical, emotional, behavioral, and other changes that may affect your loved one after a stroke.  The treatments for stroke, including the  medicines that your loved one has to take.  How to lower the risk of another stroke.  Diet and exercise changes for your loved one.  Whether you will need extra help at home.  Whether your loved one will need help using the bathroom, bathing, eating, or doing other activities.  Changes you have to make at home to make it safe for your loved one.  Whether you need to get special devices or equipment for your loved one. General help  Help your loved one establish a daily routine. This may include setting reminders or having a shared day planner or calendar.  Encourage rest. Your loved one may need frequent breaks during social situations or other activities.  Be patient. Your loved one may take longer to complete tasks and to process information.  When giving instructions, give only one instruction at a time or give step-by-step lists. Multitasking can be difficult after a stroke.  Offer assistance with household chores or other daily tasks. Make "freezer meals" that can be reheated.  Do not set expectations about your loved one's recovery. Medical visits  Gently remind your loved one of tasks and medical visits if he or she is forgetful.  Provide transportation to and from appointments.  Attend rehabilitation appointments with your loved one. By being involved in the rehabilitation plan, you can encourage your loved one and help with exercises and therapy activities at home. Preventing falls   Follow instructions to prevent falls in your loved one's home. These may include: ? Installing grab bars in bathrooms and handrails in stairways. ? Using night-lights in the bedroom, bathroom, or hallways. ? Removing rugs and mats or making them stick to the floor. ? Keeping walkways clear by removing cords and clutter from the floor. Managing finances  Help to manage your loved one's finances. Talk with a Education officer, museum or legal professional if: ? Your loved one is unable to return to  work and is in need of financial help. ? You need help paying medical bills. ? You need to establish guardianship over finances. ? You need help with estate planning. How should I care for myself? Helping a loved one recover from a stroke can be rewarding, and it can also be challenging and stressful at times. Make sure to care for your own well-being during this time. Lifestyle  Rest. Try to get 7-9 hours of uninterrupted sleep each night.  Eat a balanced diet that includes fresh fruits and vegetables, whole grains, lean proteins, and low-fat dairy.  Exercise for 30 or more minutes on 5 or more days each week.  Find ways to manage stress. These may include: ? Deep breathing, yoga, or meditation. ? Spending time outdoors. ? Journaling. Finding support   Ask for help. Take a break if you are the primary caregiver to your loved one.  Spend time with supportive people.  Join a support group with other caregivers or family members of people who have had a stroke.  If you experience new or worsening depression or anxiety, seek counseling from  a mental health professional. Where to find more information You may find more information about supporting someone who has had a stroke from:  National Stroke Association: FetchFilms.dk  American Stroke Association: www.strokeassociation.org/STROKEORG Summary  Caregivers provide essential physical and emotional support to people who have had a stroke.  Before you leave the hospital, make sure you understand how to care for someone who has had a stroke.  It is normal to have many different emotions while caring for someone who has had a stroke. Make sure to care for your own well-being during this time. This information is not intended to replace advice given to you by your health care provider. Make sure you discuss any questions you have with your health care provider. Document Released: 07/07/2016  Document Revised: 07/11/2018 Document Reviewed: 07/07/2016 Elsevier Patient Education  [place], Consistent Carbohydrate Nutrition Therapy   A heart-healthy and consistent carbohydrate diet is recommended to manage heart disease and diabetes. To follow a heart-healthy and consistent carbohydrate diet,  Eat a balanced diet with whole grains, fruits and vegetables, and lean protein sources.   Choose heart-healthy unsaturated fats. Limit saturated fats, trans fats, and cholesterol intake. Eat more plant-based or vegetarian meals using beans and soy foods for protein.   Eat whole, unprocessed foods to limit the amount of sodium (salt) you eat.   Choose a consistent amount of carbohydrate at each meal and snack. Limit refined carbohydrates especially sugar, sweets and sugar-sweetened beverages.   If you drink alcohol, do so in moderation: one serving per day (women) and two servings per day (men). o One serving is equivalent to 12 ounces beer, 5 ounces wine, or 1.5 ounces distilled spirits  Tips Tips for Choosing Heart-Healthy Fats Choose lean protein and low-fat dairy foods to reduce saturated fat intake.  Saturated fat is usually found in animal-based protein and is associated with certain health risks. Saturated fat is the biggest contributor to raise low-density lipoprotein (LDL) cholesterol levels. Research shows that limiting saturated fat lowers unhealthy cholesterol levels. Eat no more than 7% of your total calories each day from saturated fat. Ask your RDN to help you determine how much saturated fat is right for you.  There are many foods that do not contain large amounts of saturated fats. Swapping these foods to replace foods high in saturated fats will help you limit the saturated fat you eat and improve your cholesterol levels. You can also try eating more plant-based or vegetarian meals. Instead of Try:  Whole milk, cheese, yogurt, and ice cream 1% or  skim milk, low-fat cheese, non-fat yogurt, and low-fat ice cream  Fatty, marbled beef and pork Lean beef, pork, or venison  Poultry with skin Poultry without skin  Butter, stick margarine Reduced-fat, whipped, or liquid spreads  Coconut oil, palm oil Liquid vegetable oils: corn, canola, olive, soybean and safflower oils   Avoid foods that contain trans fats.  Trans fats increase levels of LDL-cholesterol. Hydrogenated fat in processed foods is the main source of trans fats in foods.   Trans fats can be found in stick margarine, shortening, processed sweets, baked goods, some fried foods, and packaged foods made with hydrogenated oils. Avoid foods with partially hydrogenated oil on the ingredient list such as: cookies, pastries, baked goods, biscuits, crackers, microwave popcorn, and frozen dinners. Choose foods with heart healthy fats.  Polyunsaturated and monounsaturated fat are unsaturated fats that may help lower your blood cholesterol level when used in place of saturated fat in  your diet.  Ask your RDN about taking a dietary supplement with plant sterols and stanols to help lower your cholesterol level.  Research shows that substituting saturated fats with unsaturated fats is beneficial to cholesterol levels. Try these easy swaps: Instead of Try:  Butter, stick margarine, or solid shortening Reduced-fat, whipped, or liquid spreads  Beef, pork, or poultry with skin Fish and seafood  Chips, crackers, snack foods Raw or unsalted nuts and seeds or nut butters Hummus with vegetables Avocado on toast  Coconut oil, palm oil Liquid vegetable oils: corn, canola, olive, soybean and safflower oils  Limit the amount of cholesterol you eat to less than 200 milligrams per day.  Cholesterol is a substance carried through the bloodstream via lipoproteins, which are known as transporters of fat. Some body functions need cholesterol to work properly, but too much cholesterol in the bloodstream can  damage arteries and build up blood vessel linings (which can lead to heart attack and stroke). You should eat less than 200 milligrams cholesterol per day.  People respond differently to eating cholesterol. There is no test available right now that can figure out which people will respond more to dietary cholesterol and which will respond less. For individuals with high intake of dietary cholesterol, different types of increase (none, small, moderate, large) in LDL-cholesterol levels are all possible.   Food sources of cholesterol include egg yolks and organ meats such as liver, gizzards. Limit egg yolks to two to four per week and avoid organ meats like liver and gizzards to control cholesterol intake. Tips for Choosing Heart-Healthy Carbohydrates Consume a consistent amount of carbohydrate  It is important to eat foods with carbohydrates in moderation because they impact your blood glucose level. Carbohydrates can be found in many foods such as:  Grains (breads, crackers, rice, pasta, and cereals)   Starchy Vegetables (potatoes, corn, and peas)   Beans and legumes   Milk, soy milk, and yogurt   Fruit and fruit juice   Sweets (cakes, cookies, ice cream, jam and jelly)  Your RDN will help you set a goal for how many carbohydrate servings to eat at your meals and snacks. For many adults, eating 3 to 5 servings of carbohydrate foods at each meal and 1 or 2 carbohydrate servings for each snack works well.   Check your blood glucose level regularly. It can tell you if you need to adjust when you eat carbohydrates.  Choose foods rich in viscous (soluble) fiber  Viscous, or soluble, is found in the walls of plant cells. Viscous fiber is found only in plant-based foods. Eating foods with fiber helps to lower your unhealthy cholesterol and keep your blood glucose in range   Rich sources of viscous fiber include vegetables (asparagus, Brussels sprouts, sweet potatoes, turnips) fruit (apricots,  mangoes, oranges), legumes, and whole grains (barley, oats, and oat bran).   As you increase your fiber intake gradually, also increase the amount of water you drink. This will help prevent constipation.   If you have difficulty achieving this goal, ask your RDN about fiber laxatives. Choose fiber supplements made with viscous fibers such as psyllium seed husks or methylcellulose to help lower unhealthy cholesterol.   Limit refined carbohydrates   There are three types of carbohydrates: starches, sugar, and fiber. Some carbohydrates occur naturally in food, like the starches in rice or corn or the sugars in fruits and milk. Refined carbohydrates--foods with high amounts of simple sugars--can raise triglyceride levels. High triglyceride levels are associated with  coronary heart disease.  Some examples of refined carbohydrate foods are table sugar, sweets, and beverages sweetened with added sugar. Tips for Reducing Sodium (Salt) Although sodium is important for your body to function, too much sodium can be harmful for people with high blood pressure. As sodium and fluid buildup in your tissues and bloodstream, your blood pressure increases. High blood pressure may cause damage to other organs and increase your risk for a stroke. Even if you take a pill for blood pressure or a water pill (diuretic) to remove fluid, it is still important to have less salt in your diet. Ask your doctor and RDN what amount of sodium is right for you.  Avoid processed foods. Eat more fresh foods.   Fresh fruits and vegetables are naturally low in sodium, as well as frozen vegetables and fruits that have no added juices or sauces.   Fresh meats are lower in sodium than processed meats, such as bacon, sausage, and hotdogs. Read the nutrition label or ask your butcher to help you find a fresh meat that is low in sodium.  Eat less salt--at the table and when cooking.   A single teaspoon of table salt has 2,300 mg of  sodium.   Leave the salt out of recipes for pasta, casseroles, and soups.   Ask your RDN how to cook your favorite recipes without sodium  Be a smart shopper.   Look for food packages that say salt-free or sodium-free. These items contain less than 5 milligrams of sodium per serving.   Very low-sodium products contain less than 35 milligrams of sodium per serving.   Low-sodium products contain less than 140 milligrams of sodium per serving.   Beware for Unsalted or No Added Salt products. These items may still be high in sodium. Check the nutrition label.  Add flavors to your food without adding sodium.   Try lemon juice, lime juice, fruit juice or vinegar.   Dry or fresh herbs add flavor. Try basil, bay leaf, dill, rosemary, parsley, sage, dry mustard, nutmeg, thyme, and paprika.   Pepper, red pepper flakes, and cayenne pepper can add spice t your meals without adding sodium. Hot sauce contains sodium, but if you use just a drop or two, it will not add up to much.   Buy a sodium-free seasoning blend or make your own at home. Additional Lifestyle Tips Achieve and maintain a healthy weight.  Talk with your RDN or your doctor about what is a healthy weight for you.  Set goals to reach and maintain that weight.   To lose weight, reduce your calorie intake along with increasing your physical activity. A weight loss of 10 to 15 pounds could reduce LDL-cholesterol by 5 milligrams per deciliter. Participate in physical activity.  Talk with your health care team to find out what types of physical activity are best for you. Set a plan to get about 30 minutes of exercise on most days.  Foods Recommended Food Group Foods Recommended  Grains Whole grain breads and cereals, including whole wheat, barley, rye, buckwheat, corn, teff, quinoa, millet, amaranth, brown or wild rice, sorghum, and oats Pasta, especially whole wheat or other whole grain types  [employer], quinoa or  wild rice Whole grain crackers, bread, rolls, pitas Home-made bread with reduced-sodium baking soda  Protein Foods Lean cuts of beef and pork (loin, leg, round, extra lean hamburger)  Skinless Cytogeneticist and other wild game Dried beans and peas Nuts and nut butters Meat  alternatives made with soy or textured vegetable protein  Egg whites or egg substitute Cold cuts made with lean meat or soy protein  Dairy Nonfat (skim), low-fat, or 1%-fat milk  Nonfat or low-fat yogurt or cottage cheese Fat-free and low-fat cheese  Vegetables Fresh, frozen, or canned vegetables without added fat or salt   Fruits Fresh, frozen, canned, or dried fruit   Oils Unsaturated oils (corn, olive, peanut, soy, sunflower, canola)  Soft or liquid margarines and vegetable oil spreads  Salad dressings Seeds and nuts  Avocado   Foods Not Recommended Food Group Foods Not Recommended  Grains Breads or crackers topped with salt Cereals (hot or cold) with more than 300 mg sodium per serving Biscuits, cornbread, and other quick breads prepared with baking soda Bread crumbs or stuffing mix from a store High-fat bakery products, such as doughnuts, biscuits, croissants, danish pastries, pies, cookies Instant cooking foods to which you add hot water and stir--potatoes, noodles, rice, etc. Packaged starchy foods--seasoned noodle or rice dishes, stuffing mix, macaroni and cheese dinner Snacks made with partially hydrogenated oils, including chips, cheese puffs, snack mixes, regular crackers, butter-flavored popcorn  Protein Foods Higher-fat cuts of meats (ribs, t-bone steak, regular hamburger) Bacon, sausage, or hot dogs Cold cuts, such as salami or bologna, deli meats, cured meats, corned beef Organ meats (liver, brains, gizzards, sweetbreads) Poultry with skin Fried or smoked meat, poultry, and fish Whole eggs and egg yolks (more than 2-4 per week) Salted legumes, nuts, seeds, or nut/seed butters Meat  alternatives with high levels of sodium (>300 mg per serving) or saturated fat (>5 g per serving)  Dairy Whole milk,?2% fat milk, buttermilk Whole milk yogurt or ice cream Cream Half-&-half Cream cheese Sour cream Cheese  Vegetables Canned or frozen vegetables with salt, fresh vegetables prepared with salt, butter, cheese, or cream sauce Fried vegetables Pickled vegetables such as olives, pickles, or sauerkraut  Fruits Fried fruits Fruits served with butter or cream  Oils Butter, stick margarine, shortening Partially hydrogenated oils or trans fats Tropical oils (coconut, palm, palm kernel oils)  Other Candy, sugar sweetened soft drinks and desserts Salt, sea salt, garlic salt, and seasoning mixes containing salt Bouillon cubes Ketchup, barbecue sauce, Worcestershire sauce, soy sauce, teriyaki sauce Miso Salsa Pickles, olives, relish   Heart Healthy Consistent Carbohydrate Vegetarian (Lacto-Ovo) Sample 1-Day Menu  Breakfast 1 cup oatmeal, cooked (2 carbohydrate servings)   cup blueberries (1 carbohydrate serving)  11 almonds, without salt  1 cup 1% milk (1 carbohydrate serving)  1 cup coffee  Morning Snack 1 cup fat-free plain yogurt (1 carbohydrate serving)  Lunch 1 whole wheat bun (1 carbohydrate servings)  1 black bean burger (1 carbohydrate servings)  1 slice cheddar cheese, low sodium  2 slices tomatoes  2 leaves lettuce  1 teaspoon mustard  1 small pear (1 carbohydrate servings)  1 cup green tea, unsweetened  Afternoon Snack 1/3 cup trail mix with nuts, seeds, and raisins, without salt (1 carbohydrate servinga)  Evening Meal  cup meatless chicken  2/3 cup brown rice, cooked (2 carbohydrate servings)  1 cup broccoli, cooked (2/3 carbohydrate serving)   cup carrots, cooked (1/3 carbohydrate serving)  2 teaspoons olive oil  1 teaspoon balsamic vinegar  1 whole wheat dinner roll (1 carbohydrate serving)  1 teaspoon margarine, soft, tub  1 cup 1% milk (1  carbohydrate serving)  Evening Snack 1 extra small banana (1 carbohydrate serving)  1 tablespoon peanut butter   Heart Healthy Consistent Carbohydrate Vegan Sample 1-Day  Menu  Breakfast 1 cup oatmeal, cooked (2 carbohydrate servings)   cup blueberries (1 carbohydrate serving)  11 almonds, without salt  1 cup soymilk fortified with calcium, vitamin B12, and vitamin D  1 cup coffee  Morning Snack 6 ounces soy yogurt (1 carbohydrate servings)  Lunch 1 whole wheat bun(1 carbohydrate servings)  1 black bean burger (1 carbohydrate serving)  2 slices tomatoes  2 leaves lettuce  1 teaspoon mustard  1 small pear (1 carbohydrate servings)  1 cup green tea, unsweetened  Afternoon Snack 1/3 cup trail mix with nuts, seeds, and raisins, without salt (1 carbohydrate servings)  Evening Meal  cup meatless chicken  2/3 cup brown rice, cooked (2 carbohydrate servings)  1 cup broccoli, cooked (2/3 carbohydrate serving)   cup carrots, cooked (1/3 carbohydrate serving)  2 teaspoons olive oil  1 teaspoon balsamic vinegar  1 whole wheat dinner roll (1 carbohydrate serving)  1 teaspoon margarine, soft, tub  1 cup soymilk fortified with calcium, vitamin B12, and vitamin D  Evening Snack 1 extra small banana (1 carbohydrate serving)  1 tablespoon peanut butter    Heart Healthy Consistent Carbohydrate Sample 1-Day Menu  Breakfast 1 cup cooked oatmeal (2 carbohydrate servings)  3/4 cup blueberries (1 carbohydrate serving)  1 ounce almonds  1 cup skim milk (1 carbohydrate serving)  1 cup coffee  Morning Snack 1 cup sugar-free nonfat yogurt (1 carbohydrate serving)  Lunch 2 slices whole-wheat bread (2 carbohydrate servings)  2 ounces lean [place] breast  1 ounce low-fat Swiss cheese  1 teaspoon mustard  1 slice tomato  1 lettuce leaf  1 small pear (1 carbohydrate serving)  1 cup skim milk (1 carbohydrate serving)  Afternoon Snack 1 ounce trail mix with unsalted nuts, seeds, and raisins (1  carbohydrate serving)  Evening Meal 3 ounces salmon  2/3 cup cooked brown rice (2 carbohydrate servings)  1 teaspoon soft margarine  1 cup cooked broccoli with 1/2 cup cooked carrots (1 carbohydrate serving  Carrots, cooked, boiled, drained, without salt  1 cup lettuce  1 teaspoon olive oil with vinegar for dressing  1 small whole grain roll (1 carbohydrate serving)  1 teaspoon soft margarine  1 cup unsweetened tea  Evening Snack 1 extra-small banana (1 carbohydrate serving)  Copyright 2020  Academy of Nutrition and Dietetics. All rights reserved.

## 2019-01-16 NOTE — Plan of Care (Signed)
  RD consulted for nutrition education regarding diabetes.  RD working remotely.  Lab Results  Component Value Date   HGBA1C 11.5 (H) 01/14/2019    Unable to reach patient via phone. RD attached "Carbohydrate Counting for People with Diabetes" handout from the Academy of Nutrition and Dietetics to discharge instructions. Handout discusses different food groups and their effects on blood sugar, emphasizing carbohydrate-containing foods. Provides a list of carbohydrates and recommended serving sizes of common foods.  Discusses the importance of controlled and consistent carbohydrate intake throughout the day. Handout provides examples of ways to balance meals/snacks and encourages intake of high-fiber, whole grain complex carbohydrates.   Body mass index is 26.58 kg/m. Pt meets criteria for overweight based on current BMI.  Current diet order is CM, no documented meals at this time. Labs and medications reviewed. No further nutrition interventions warranted at this time. RD contact information provided. If additional nutrition issues arise, please re-consult RD.  Lajuan Lines, RD, LDN Clinical Nutrition [place] Telephone [phone] After Hours/Weekend Pager: [phone]

## 2019-01-16 NOTE — TOC Transition Note (Signed)
Transition of Care [facility]) - CM/SW Discharge Note   Patient Details  Name: Liberato Boakye MRN: [id number] Date of Birth: 08/18/1955  [facility] CM/SW Contact:  Pollie Friar, RN Phone Number: 01/16/2019, 2:25 PM   Clinical Narrative:    Pt is discharging home with self care. No f/u per PT/OT and no DME needs. Pt has transportation home.    Final next level of care: Home/Self Care Barriers to Discharge: No Barriers Identified   Patient Goals and CMS Choice        Discharge Placement                       Discharge Plan and Services                                     Social Determinants of Health (SDOH) Interventions     Readmission Risk Interventions No flowsheet data found.

## 2019-01-22 ENCOUNTER — Encounter (HOSPITAL_COMMUNITY): Payer: Self-pay

## 2019-01-22 ENCOUNTER — Emergency Department (HOSPITAL_COMMUNITY): Payer: Medicare PPO

## 2019-01-22 ENCOUNTER — Other Ambulatory Visit: Payer: Self-pay

## 2019-01-22 ENCOUNTER — Observation Stay (HOSPITAL_COMMUNITY)
Admission: EM | Admit: 2019-01-22 | Discharge: 2019-01-23 | Disposition: A | Discharge status: Home / Self Care | Payer: Medicare PPO | Attending: Internal Medicine | Admitting: Internal Medicine

## 2019-01-22 DIAGNOSIS — Z7982 Long term (current) use of aspirin: Secondary | ICD-10-CM | POA: Diagnosis not present

## 2019-01-22 DIAGNOSIS — Z7902 Long term (current) use of antithrombotics/antiplatelets: Secondary | ICD-10-CM | POA: Insufficient documentation

## 2019-01-22 DIAGNOSIS — N400 Enlarged prostate without lower urinary tract symptoms: Secondary | ICD-10-CM | POA: Diagnosis not present

## 2019-01-22 DIAGNOSIS — R471 Dysarthria and anarthria: Secondary | ICD-10-CM | POA: Insufficient documentation

## 2019-01-22 DIAGNOSIS — E1165 Type 2 diabetes mellitus with hyperglycemia: Secondary | ICD-10-CM | POA: Diagnosis not present

## 2019-01-22 DIAGNOSIS — I6330 Cerebral infarction due to thrombosis of unspecified cerebral artery: Secondary | ICD-10-CM

## 2019-01-22 DIAGNOSIS — Z20828 Contact with and (suspected) exposure to other viral communicable diseases: Secondary | ICD-10-CM | POA: Diagnosis not present

## 2019-01-22 DIAGNOSIS — N179 Acute kidney failure, unspecified: Secondary | ICD-10-CM | POA: Diagnosis not present

## 2019-01-22 DIAGNOSIS — I6782 Cerebral ischemia: Secondary | ICD-10-CM | POA: Insufficient documentation

## 2019-01-22 DIAGNOSIS — I639 Cerebral infarction, unspecified: Secondary | ICD-10-CM | POA: Diagnosis not present

## 2019-01-22 DIAGNOSIS — R7989 Other specified abnormal findings of blood chemistry: Secondary | ICD-10-CM | POA: Diagnosis not present

## 2019-01-22 DIAGNOSIS — E785 Hyperlipidemia, unspecified: Secondary | ICD-10-CM

## 2019-01-22 DIAGNOSIS — E1151 Type 2 diabetes mellitus with diabetic peripheral angiopathy without gangrene: Secondary | ICD-10-CM | POA: Diagnosis not present

## 2019-01-22 DIAGNOSIS — R2689 Other abnormalities of gait and mobility: Secondary | ICD-10-CM | POA: Diagnosis not present

## 2019-01-22 DIAGNOSIS — E1169 Type 2 diabetes mellitus with other specified complication: Secondary | ICD-10-CM

## 2019-01-22 DIAGNOSIS — R9082 White matter disease, unspecified: Secondary | ICD-10-CM | POA: Insufficient documentation

## 2019-01-22 DIAGNOSIS — I10 Essential (primary) hypertension: Secondary | ICD-10-CM

## 2019-01-22 DIAGNOSIS — Z794 Long term (current) use of insulin: Secondary | ICD-10-CM | POA: Diagnosis not present

## 2019-01-22 DIAGNOSIS — R2990 Unspecified symptoms and signs involving the nervous system: Secondary | ICD-10-CM | POA: Diagnosis not present

## 2019-01-22 DIAGNOSIS — Z79899 Other long term (current) drug therapy: Secondary | ICD-10-CM | POA: Diagnosis not present

## 2019-01-22 DIAGNOSIS — R2681 Unsteadiness on feet: Secondary | ICD-10-CM | POA: Insufficient documentation

## 2019-01-22 DIAGNOSIS — I6340 Cerebral infarction due to embolism of unspecified cerebral artery: Secondary | ICD-10-CM

## 2019-01-22 DIAGNOSIS — E119 Type 2 diabetes mellitus without complications: Secondary | ICD-10-CM

## 2019-01-22 HISTORY — DX: Cerebral infarction, unspecified: I63.9

## 2019-01-22 LAB — DIFFERENTIAL
Abs Immature Granulocytes: 0.03 10*3/uL (ref 0.00–0.07)
Basophils Absolute: 0 10*3/uL (ref 0.0–0.1)
Basophils Relative: 0 %
Eosinophils Absolute: 0.1 10*3/uL (ref 0.0–0.5)
Eosinophils Relative: 1 %
Immature Granulocytes: 1 %
Lymphocytes Relative: 20 %
Lymphs Abs: 1.2 10*3/uL (ref 0.7–4.0)
Monocytes Absolute: 0.7 10*3/uL (ref 0.1–1.0)
Monocytes Relative: 12 %
Neutro Abs: 3.8 10*3/uL (ref 1.7–7.7)
Neutrophils Relative %: 66 %

## 2019-01-22 LAB — I-STAT CHEM 8, ED
BUN: 21 mg/dL (ref 8–23)
Calcium, Ion: 1.24 mmol/L (ref 1.15–1.40)
Chloride: 102 mmol/L (ref 98–111)
Creatinine, Ser: 1.2 mg/dL (ref 0.61–1.24)
Glucose, Bld: 168 mg/dL — ABNORMAL HIGH (ref 70–99)
HCT: 38 % — ABNORMAL LOW (ref 39.0–52.0)
Hemoglobin: 12.9 g/dL — ABNORMAL LOW (ref 13.0–17.0)
Potassium: 3.6 mmol/L (ref 3.5–5.1)
Sodium: 138 mmol/L (ref 135–145)
TCO2: 24 mmol/L (ref 22–32)

## 2019-01-22 LAB — BASIC METABOLIC PANEL
Anion gap: 12 (ref 5–15)
BUN: 23 mg/dL (ref 8–23)
CO2: 22 mmol/L (ref 22–32)
Calcium: 9 mg/dL (ref 8.9–10.3)
Chloride: 101 mmol/L (ref 98–111)
Creatinine, Ser: 1.27 mg/dL — ABNORMAL HIGH (ref 0.61–1.24)
GFR calc Af Amer: >60 mL/min (ref >60)
GFR calc non Af Amer: 59 mL/min — ABNORMAL LOW (ref >60)
Glucose, Bld: 174 mg/dL — ABNORMAL HIGH (ref 70–99)
Potassium: 3.5 mmol/L (ref 3.5–5.1)
Sodium: 135 mmol/L (ref 135–145)

## 2019-01-22 LAB — URINALYSIS, ROUTINE W REFLEX MICROSCOPIC
Bilirubin Urine: NEGATIVE
Glucose, UA: >=500 mg/dL — AB
Hgb urine dipstick: NEGATIVE
Ketones, ur: NEGATIVE mg/dL
Leukocytes,Ua: NEGATIVE
Nitrite: NEGATIVE
Protein, ur: 100 mg/dL — AB
Specific Gravity, Urine: 1.017 (ref 1.005–1.030)
pH: 6 (ref 5.0–8.0)

## 2019-01-22 LAB — CBC
HCT: 37 % — ABNORMAL LOW (ref 39.0–52.0)
Hemoglobin: 12.6 g/dL — ABNORMAL LOW (ref 13.0–17.0)
MCH: 31.3 pg (ref 26.0–34.0)
MCHC: 34.1 g/dL (ref 30.0–36.0)
MCV: 92 fL (ref 80.0–100.0)
Platelets: 179 10*3/uL (ref 150–400)
RBC: 4.02 MIL/uL — ABNORMAL LOW (ref 4.22–5.81)
RDW: 12.4 % (ref 11.5–15.5)
WBC: 5.8 10*3/uL (ref 4.0–10.5)
nRBC: 0 % (ref 0.0–0.2)

## 2019-01-22 LAB — CBG MONITORING, ED: Glucose-Capillary: 185 mg/dL — ABNORMAL HIGH (ref 70–99)

## 2019-01-22 LAB — RAPID URINE DRUG SCREEN, HOSP PERFORMED
Amphetamines: NOT DETECTED
Barbiturates: NOT DETECTED
Benzodiazepines: NOT DETECTED
Cocaine: NOT DETECTED
Opiates: NOT DETECTED
Tetrahydrocannabinol: NOT DETECTED

## 2019-01-22 LAB — ETHANOL: Alcohol, Ethyl (B): <10 mg/dL (ref <10)

## 2019-01-22 LAB — SARS CORONAVIRUS 2 (TAT 6-24 HRS): SARS Coronavirus 2: NEGATIVE

## 2019-01-22 LAB — GLUCOSE, CAPILLARY: Glucose-Capillary: 222 mg/dL — ABNORMAL HIGH (ref 70–99)

## 2019-01-22 LAB — PROTIME-INR
INR: 1 (ref 0.8–1.2)
Prothrombin Time: 13 seconds (ref 11.4–15.2)

## 2019-01-22 LAB — APTT: aPTT: 26 seconds (ref 24–36)

## 2019-01-22 IMAGING — MR MR MRA HEAD W/O CM
9 of 13 series · 28 of 48 positions shown · non-contrast
Comparison: MR head MRA head and MRA neck [DATE].

CLINICAL DATA: Slurred speech beginning at 4 a.m. today.
Right-sided weakness. Recent stroke.



[Series 3: DWI · axial · 3.0mm · 1.09mm/px · z∈[+12,+170]mm · 6 of 110 slices shown (1 of 4)]
[im 1/110]
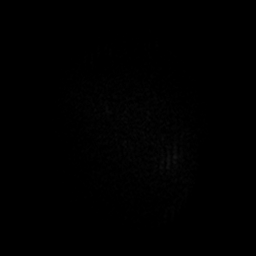
[im 22/110]
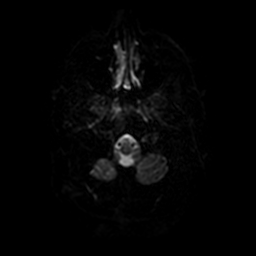
[im 44/110]
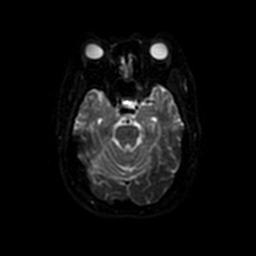
[im 66/110]
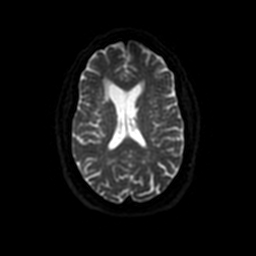
[im 88/110]
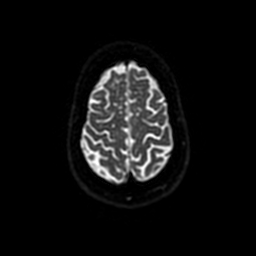
[im 110/110]
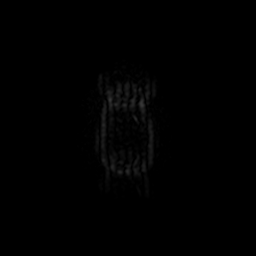

[Series 4: T1 · sagittal · 5.0mm · 0.47mm/px · 1 of 22 slices shown (1 of 2)]
[im 1/22]
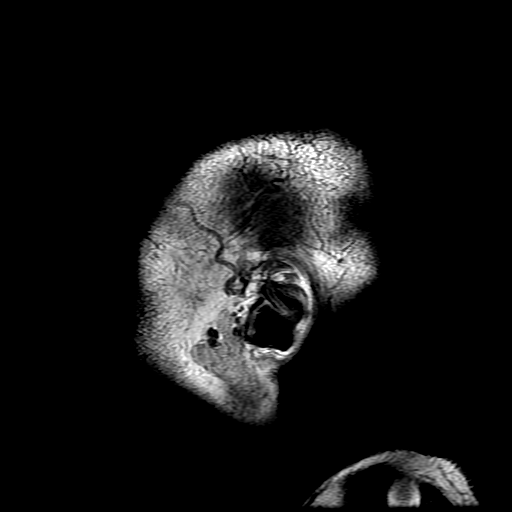

[Series 5: T2 · axial · 5.0mm · 0.43mm/px · 1 of 23 slices shown (1 of 2)]
[im 1/23]
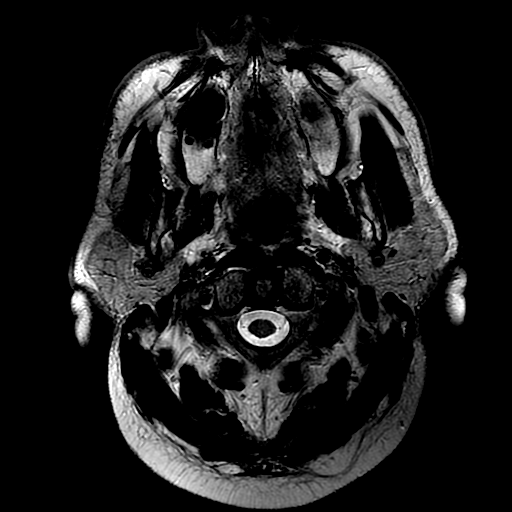

[Series 6: FLAIR · axial · 3.0mm · 0.43mm/px · 1 of 27 slices shown]
[im 1/27]
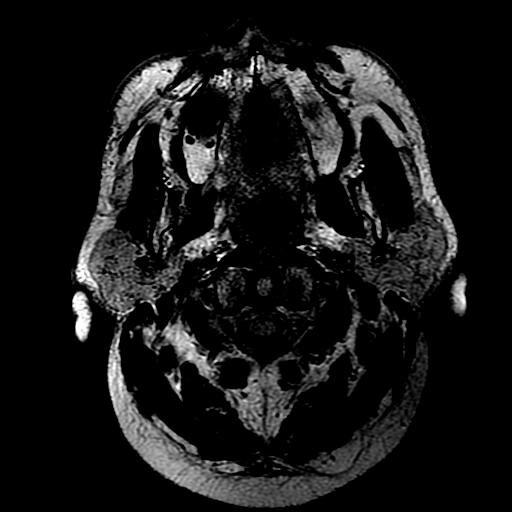

[Series 9: T1 · axial · 1.0mm · 0.47mm/px · z∈[-1,+139]mm · 7 of 164 slices shown (2 of 2)]
[im 1/164]
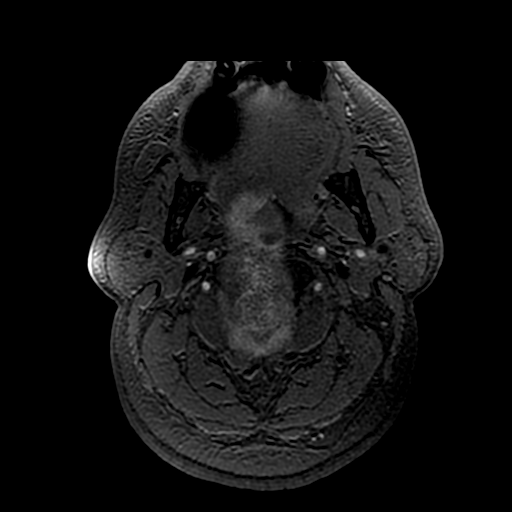
[im 21/164]
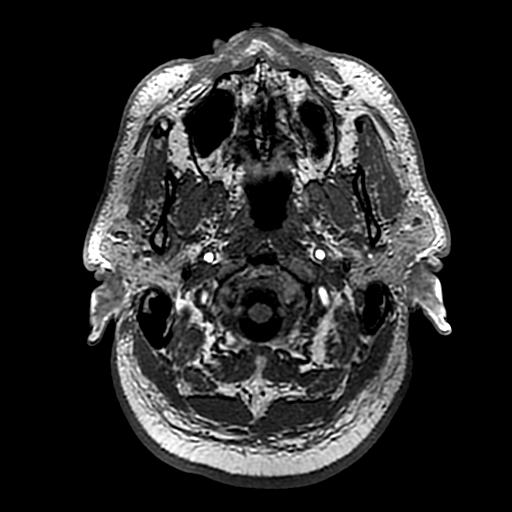
[im 41/164]
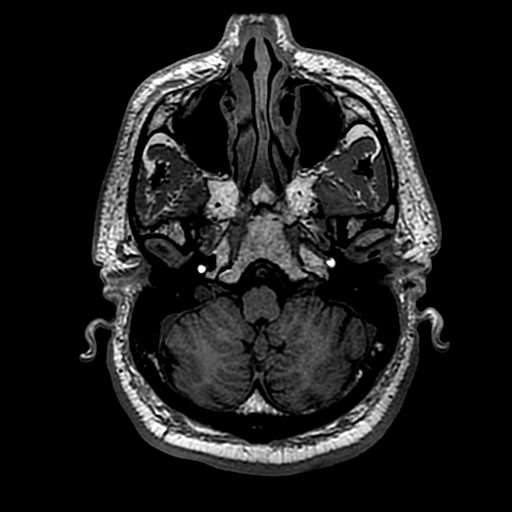
[im 62/164]
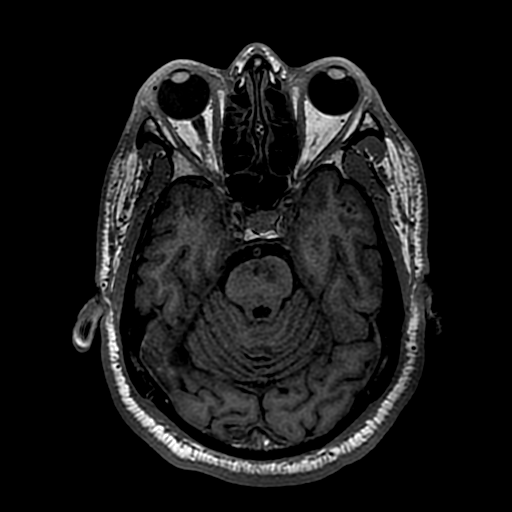
[im 102/164]
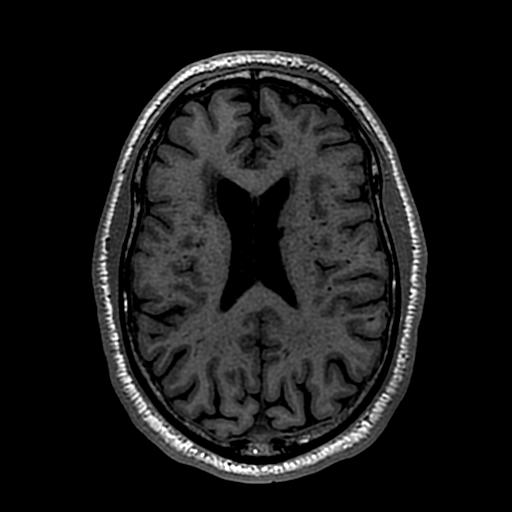
[im 123/164]
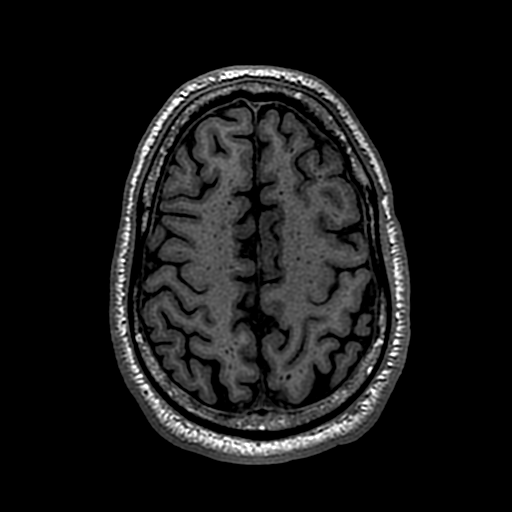
[im 143/164]
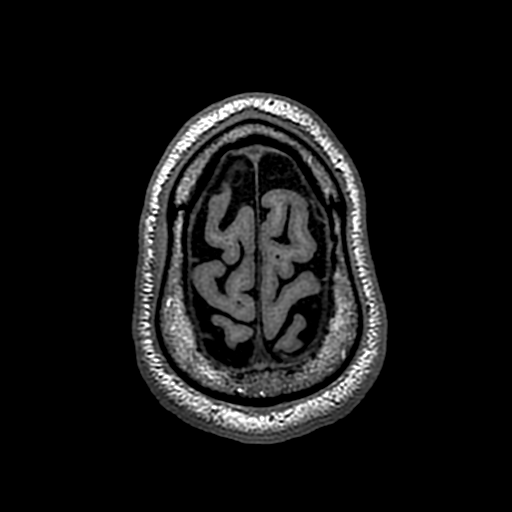

[Series 10: DWI · coronal · 4.0mm · 1.09mm/px · 5 of 92 slices shown (2 of 4)]
[im 1/92]
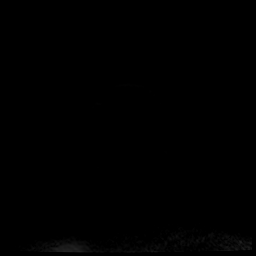
[im 23/92]
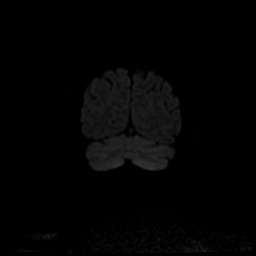
[im 46/92]
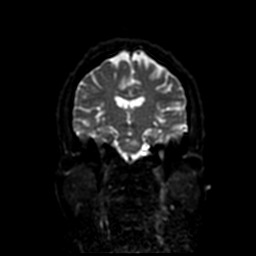
[im 69/92]
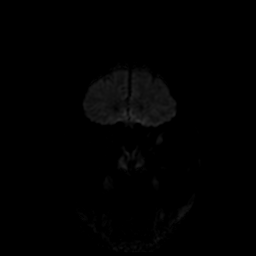
[im 92/92]
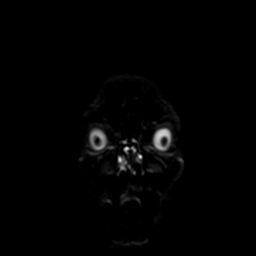

[Series 11: T2 · coronal · 5.0mm · 0.45mm/px · 2 of 29 slices shown (2 of 2)]
[im 1/29]
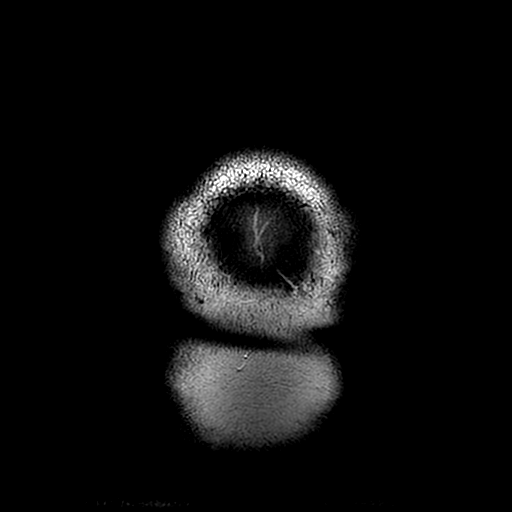
[im 29/29]
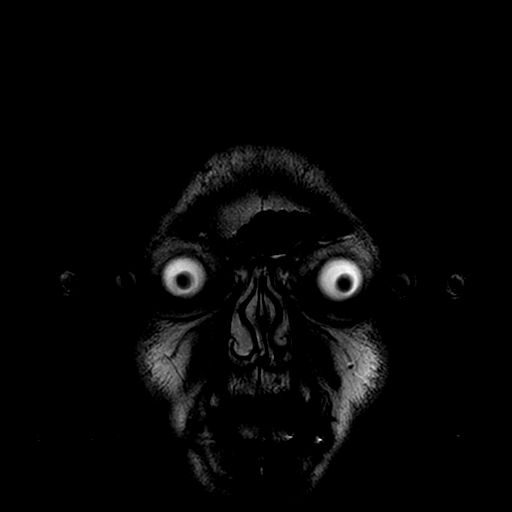

[Series 300: DWI · axial · 3.0mm · 1.09mm/px · z∈[+12,+170]mm · 3 of 55 slices shown (3 of 4)]
[im 1/55]
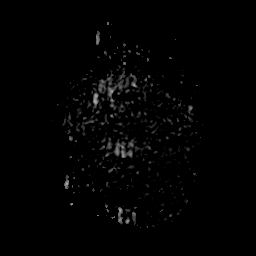
[im 28/55]
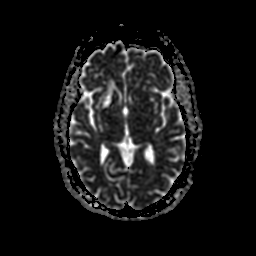
[im 55/55]
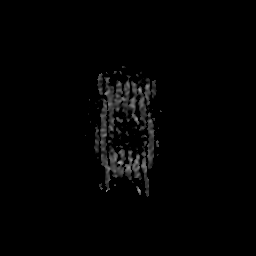

[Series 1000: DWI · coronal · 4.0mm · 1.09mm/px · 2 of 46 slices shown (4 of 4)]
[im 1/46]
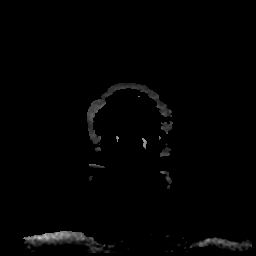
[im 46/46]
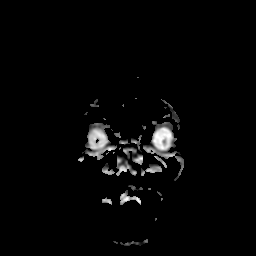

[28 of 48 positions shown; findings below may reference images not displayed]

FINDINGS: MRI HEAD FINDINGS

Brain: The left paramedian pontine infarct is slightly larger on
today's study. Associated T2 signal changes are present. Right
paramedian white matter changes are stable. No other acute infarct
is present. Remote infarct involving the right lentiform nucleus and
periventricular white matter is again noted. Periventricular and
subcortical T2 hyperintensities bilaterally are otherwise stable.

No acute hemorrhage or mass lesion is present.

Vascular: Flow is present in the major intracranial arteries.

Skull and upper cervical spine: The craniocervical junction is
normal. Upper cervical spine is within normal limits. Marrow signal
is unremarkable.

Sinuses/Orbits: Mild mucosal thickening is present throughout the
ethmoid air cells, inferior maxillary sinuses, and sphenoid sinuses.
The mastoid air cells are clear. Globes and orbits are within normal
limits.

MRA HEAD FINDINGS

Severe stenosis of the posterior left M2 origin is again seen. The
internal carotid arteries are within normal limits scratched at the
internal carotid arteries demonstrate some atherosclerotic change
through the cavernous segments, stable the A1 segments are normal.
The M1 segments are normal. MCA bifurcations are otherwise normal.
The ACA and MCA branch vessels demonstrate some distal small vessel
disease.

The right vertebral artery is slightly dominant to the left. There
is a high-grade stenosis at the origin of the right PICA, stable.
Left AICA is dominant. The basilar artery is within normal limits.
Both posterior cerebral arteries originate from the basilar tip.
There is mild proximal right P1 segment stenosis. A severe more
distal right P2 segment stenosis present. There is a moderate distal
left P2 segment stenosis.

MRA NECK FINDINGS

The time-of-flight images demonstrate no significant flow
disturbance at either carotid bifurcation to suggest a significant
stenosis. There is a common origin of the left common carotid artery
and the innominate artery. Flow is antegrade in the vertebral
arteries bilaterally. The right vertebral artery is dominant. No
definite stenosis is present.
IMPRESSION: 1. Slight increase in size of left paramedian pontine infarct.
2. Otherwise stable atrophy and white matter disease bilaterally.
3. No new intracranial abnormality.
4. Remote right lentiform nucleus and periventricular white matter
infarcts are stable.
5. Severe stenosis of the posterior left M2 origin.
6. Moderate to high-grade stenosis of the distal right P2 segment.
7. Moderate distal left P2 segment stenosis.
8. No other significant proximal stenosis, aneurysm, or branch
vessel occlusion within the Circle of Willis.

## 2019-01-22 IMAGING — CT CT HEAD W/O CM
3 series · 15 of 47 positions shown, 18 images · non-contrast
Comparison: [DATE]

CLINICAL DATA: Speech difficulty, history of recent pontine infarct

EXAM:
CT HEAD WITHOUT CONTRAST
TECHNIQUE: Contiguous axial images were obtained from the base of the skull
through the vertex without intravenous contrast.

[Series 2: head wo · axial · 0.52mm/px · z∈[-144,-14]mm · 9 of 32 slices shown, 12 images]
[im 3/32  brain]
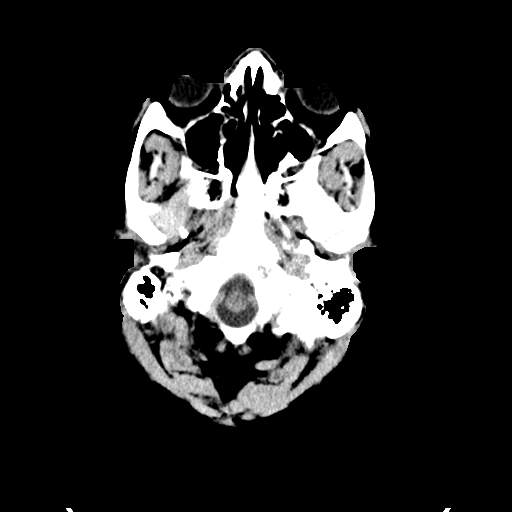
[im 3/32  bone]
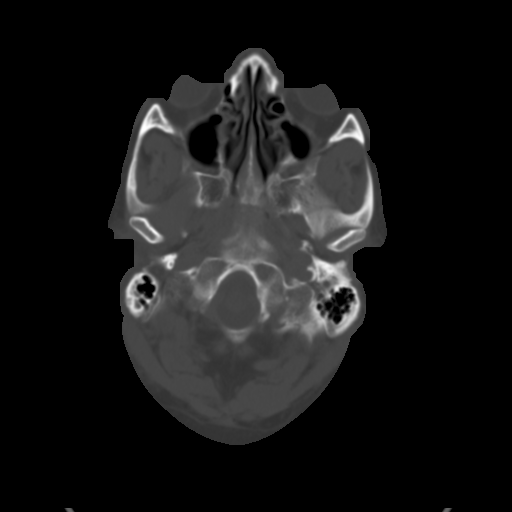
[im 6/32  brain]
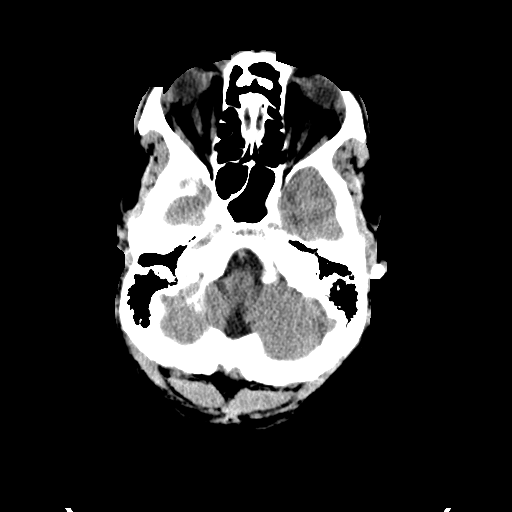
[im 9/32  brain]
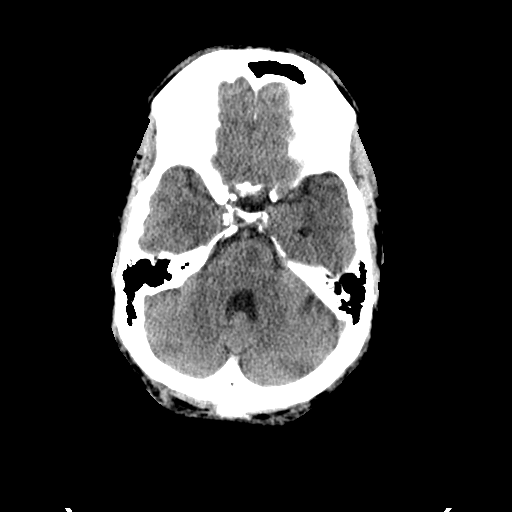
[im 12/32  brain]
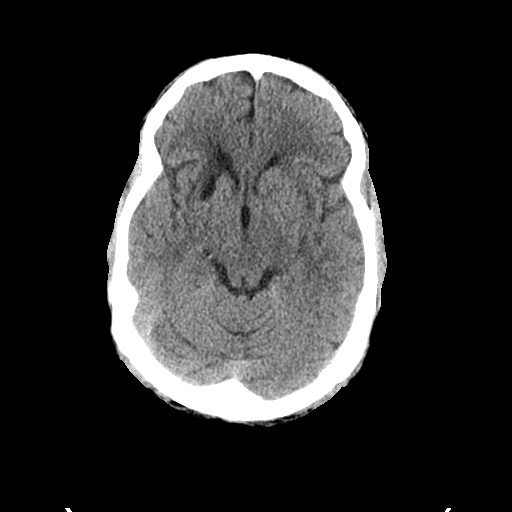
[im 17/32  brain]
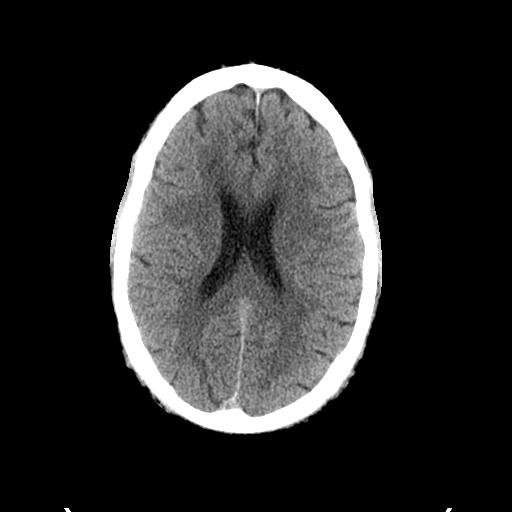
[im 17/32  bone]
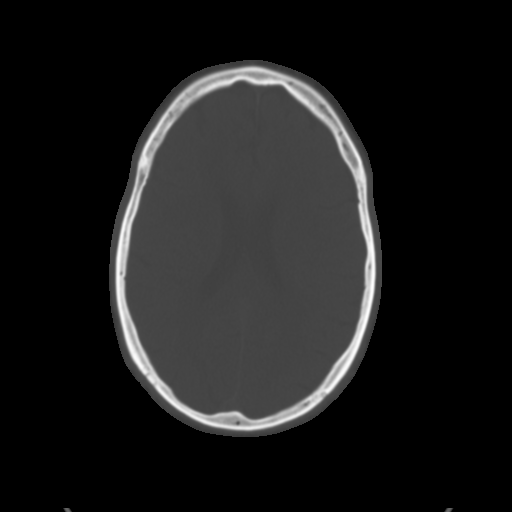
[im 20/32  brain]
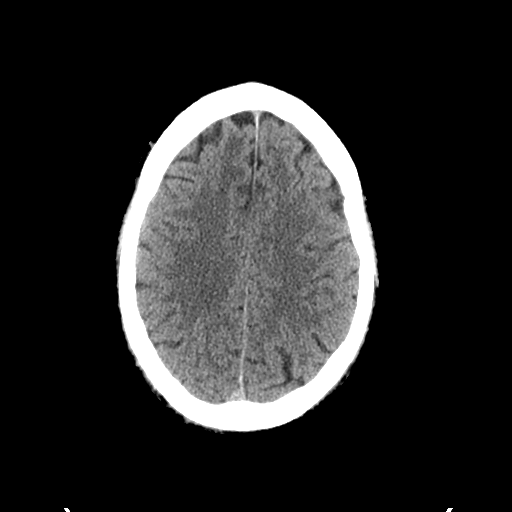
[im 23/32  brain]
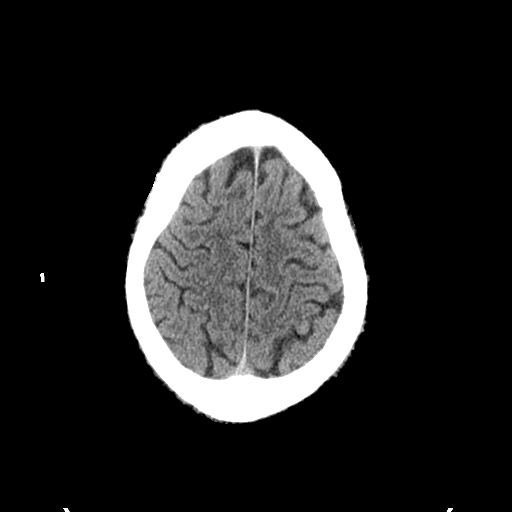
[im 26/32  brain]
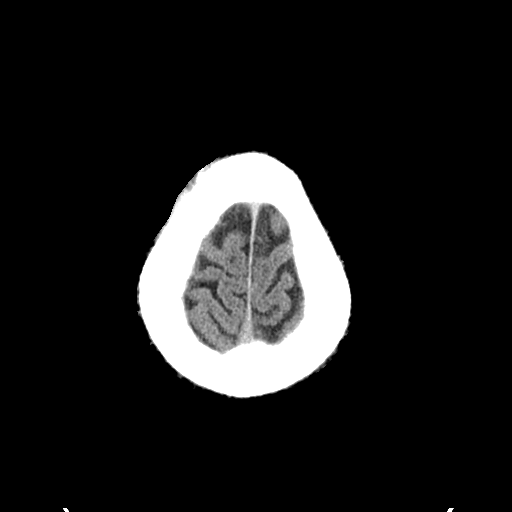
[im 29/32  brain]
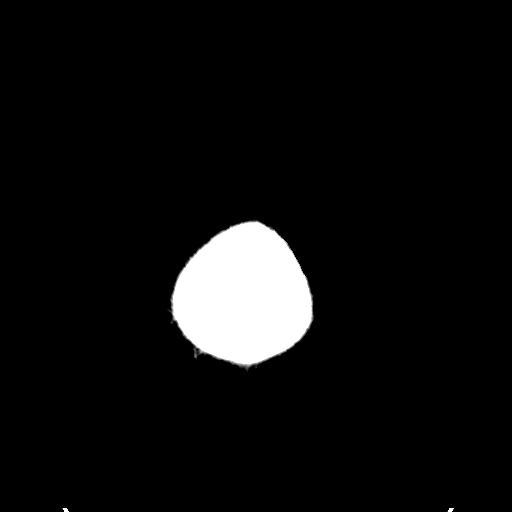
[im 29/32  bone]
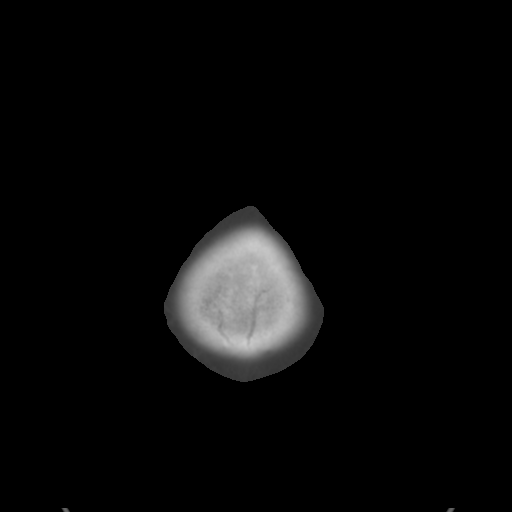

[Series 4: coronal soft tissue · coronal · 0.34mm/px · 3 of 82 slices shown]
[im 28/82  brain]
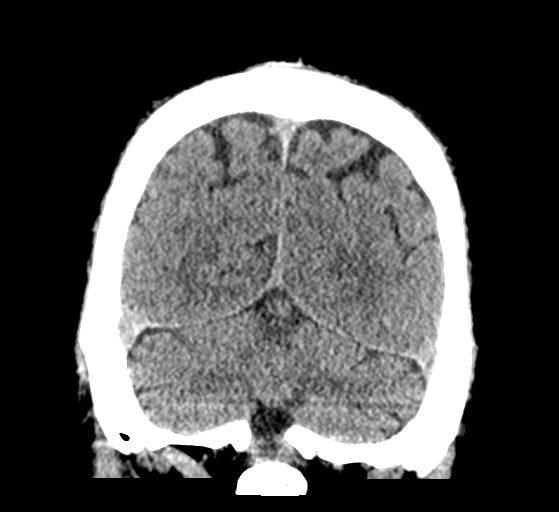
[im 37/82  brain]
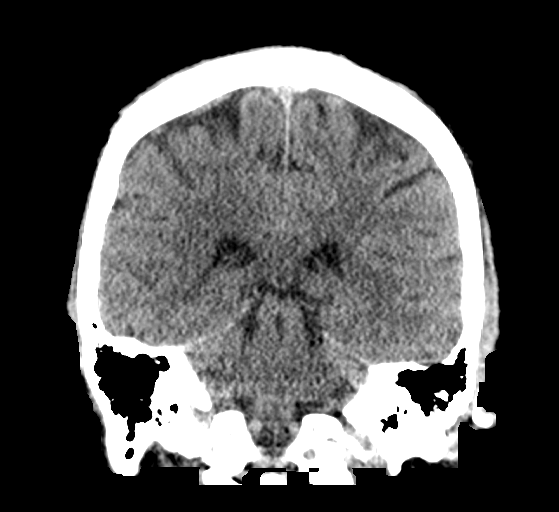
[im 46/82  brain]
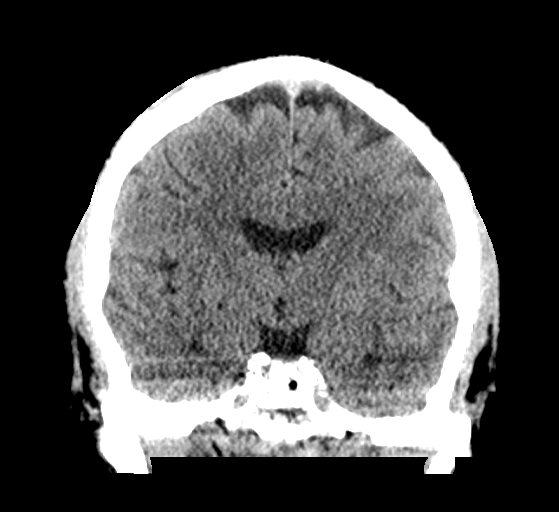

[Series 5: sagittal soft tissue · sagittal · 0.35mm/px · 3 of 60 slices shown]
[im 20/60  brain]
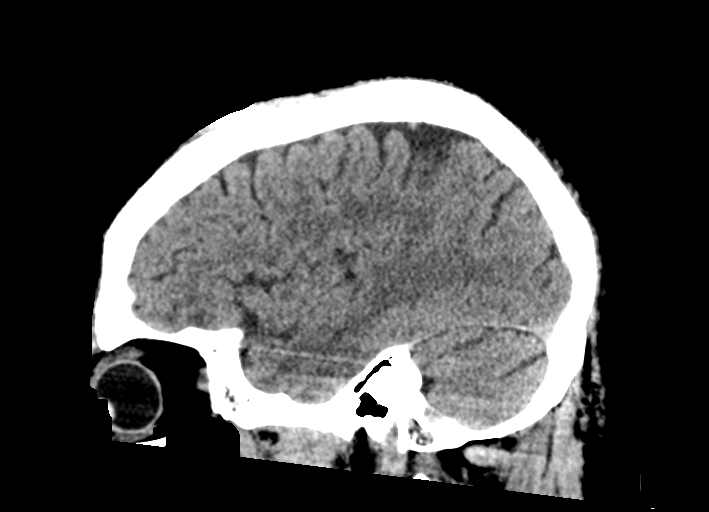
[im 30/60  brain]
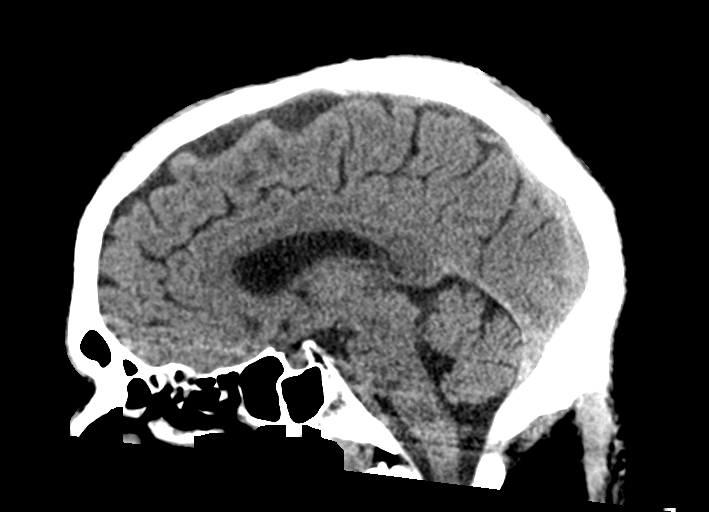
[im 40/60  brain]
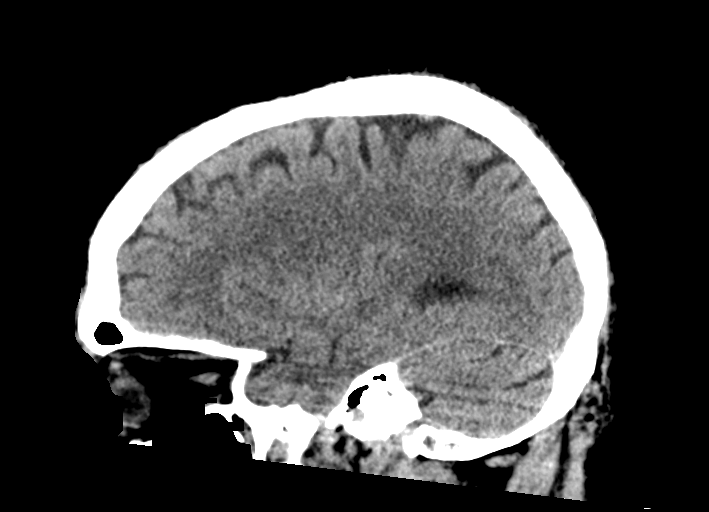

[15 of 47 positions shown; findings below may reference images not displayed]

FINDINGS: Brain: Faint hypodensity is noted in the left aspect of the pons
consistent with the known prior infarct. Areas of chronic ischemia
are again seen adjacent to the right lateral ventricle. No findings
to suggest acute hemorrhage, acute infarction or space-occupying
mass lesion are noted.

Vascular: No hyperdense vessel or unexpected calcification.

Skull: Normal. Negative for fracture or focal lesion.

Sinuses/Orbits: No acute finding.

Other: Scalp scarring is again seen and stable.
IMPRESSION: Changes consistent with the recent left pontine infarct.

Chronic ischemic changes are noted. No new focal abnormality is
seen.

## 2019-01-22 IMAGING — MR MR HEAD W/O CM
9 of 13 series · 28 of 48 positions shown · non-contrast
Comparison: MR head MRA head and MRA neck [DATE].

CLINICAL DATA: Slurred speech beginning at 4 a.m. today.
Right-sided weakness. Recent stroke.



[Series 3: DWI · axial · 3.0mm · 1.09mm/px · z∈[+12,+170]mm · 6 of 110 slices shown (1 of 4)]
[im 1/110]
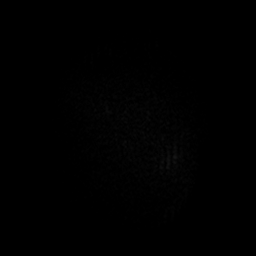
[im 22/110]
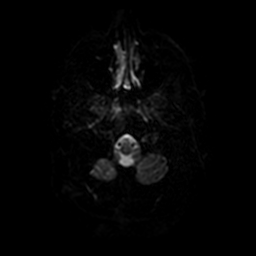
[im 44/110]
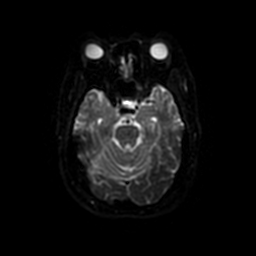
[im 66/110]
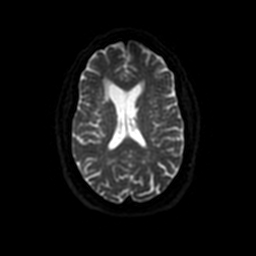
[im 88/110]
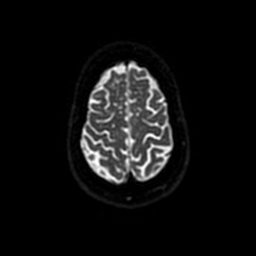
[im 110/110]
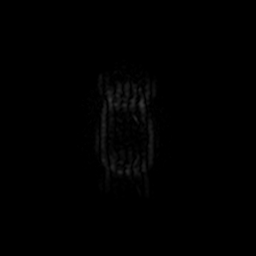

[Series 4: T1 · sagittal · 5.0mm · 0.47mm/px · 1 of 22 slices shown (1 of 2)]
[im 1/22]
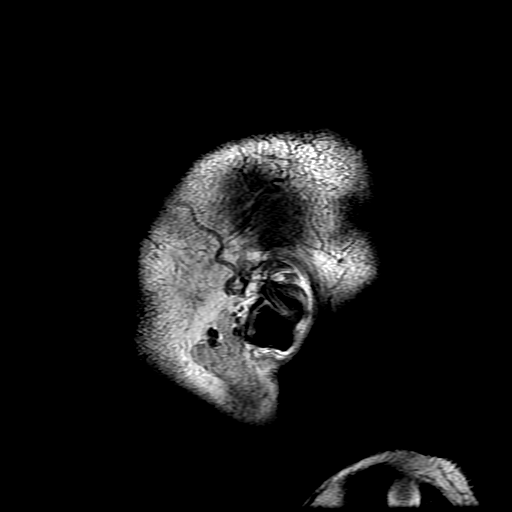

[Series 5: T2 · axial · 5.0mm · 0.43mm/px · 1 of 23 slices shown (1 of 2)]
[im 1/23]
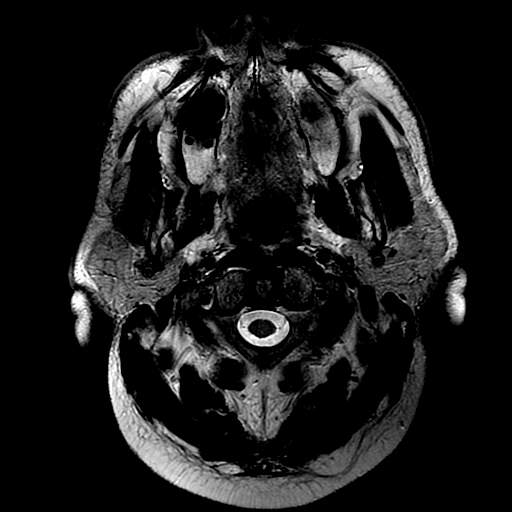

[Series 6: FLAIR · axial · 3.0mm · 0.43mm/px · 1 of 27 slices shown]
[im 1/27]
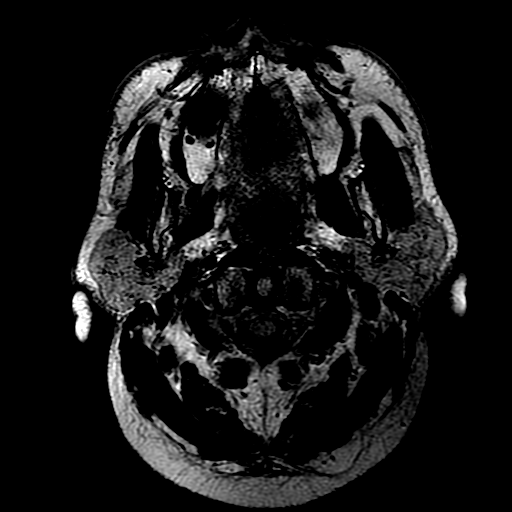

[Series 9: T1 · axial · 1.0mm · 0.47mm/px · z∈[-1,+139]mm · 7 of 164 slices shown (2 of 2)]
[im 1/164]
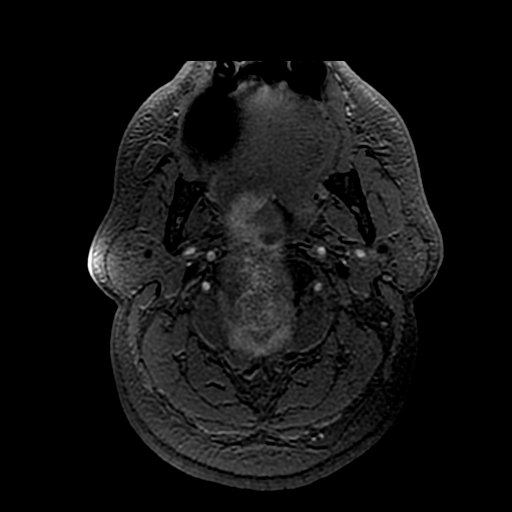
[im 21/164]
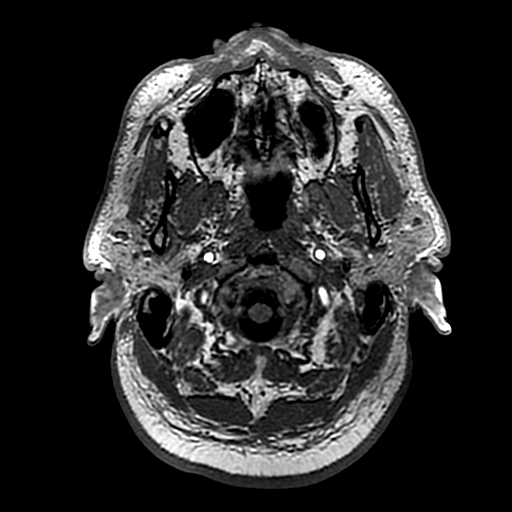
[im 41/164]
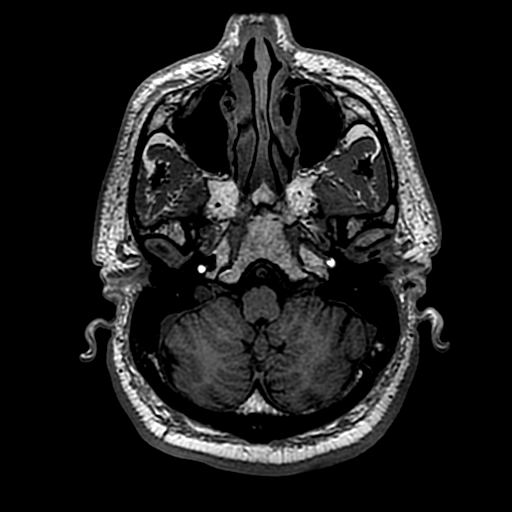
[im 62/164]
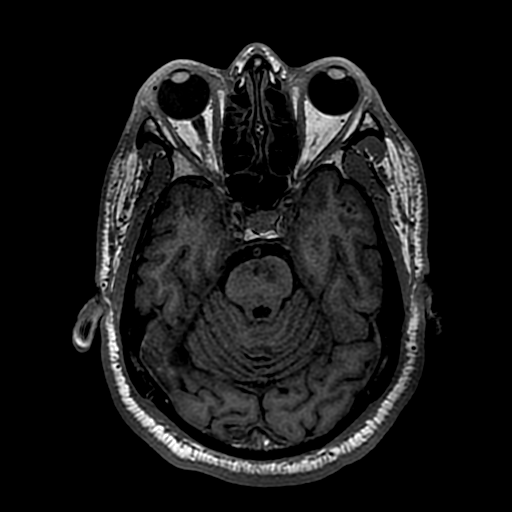
[im 102/164]
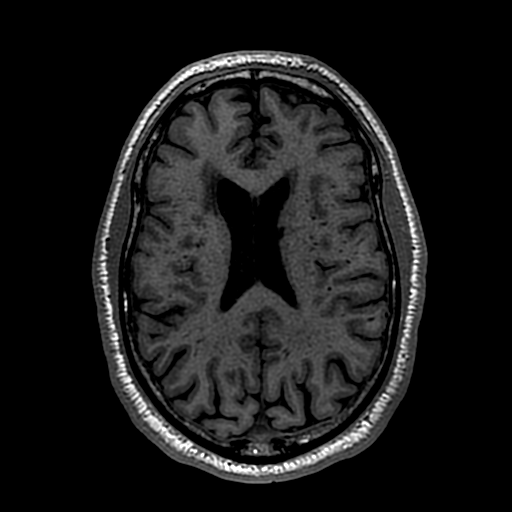
[im 123/164]
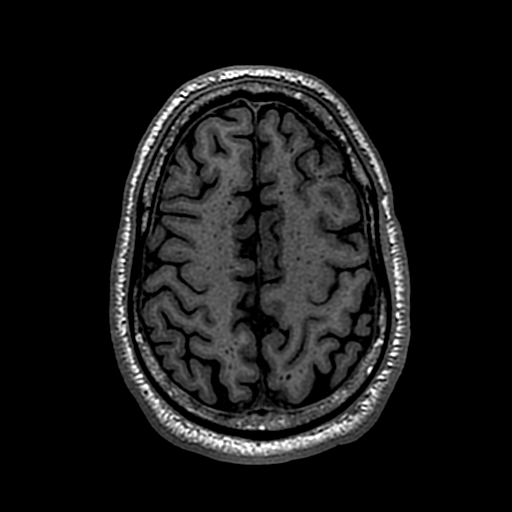
[im 143/164]
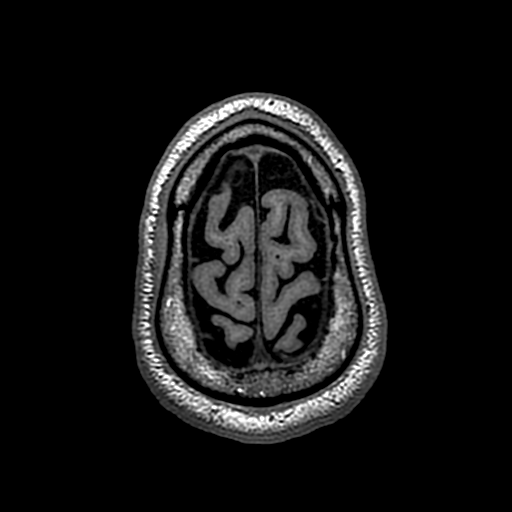

[Series 10: DWI · coronal · 4.0mm · 1.09mm/px · 5 of 92 slices shown (2 of 4)]
[im 1/92]
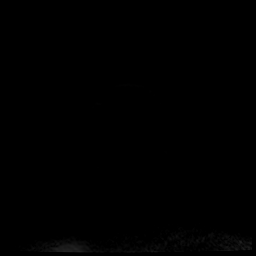
[im 23/92]
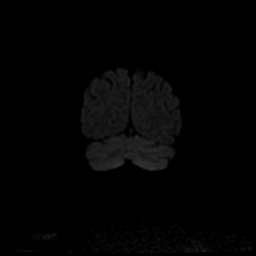
[im 46/92]
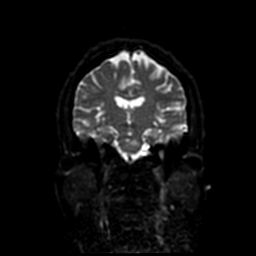
[im 69/92]
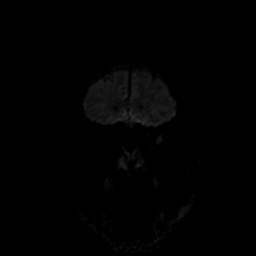
[im 92/92]
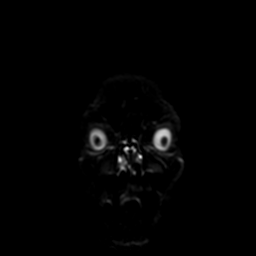

[Series 11: T2 · coronal · 5.0mm · 0.45mm/px · 2 of 29 slices shown (2 of 2)]
[im 1/29]
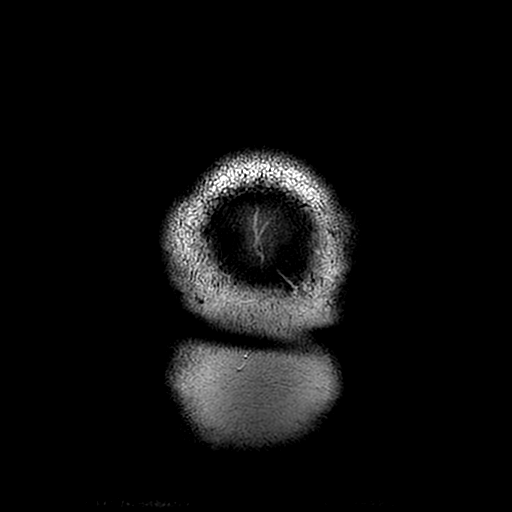
[im 29/29]
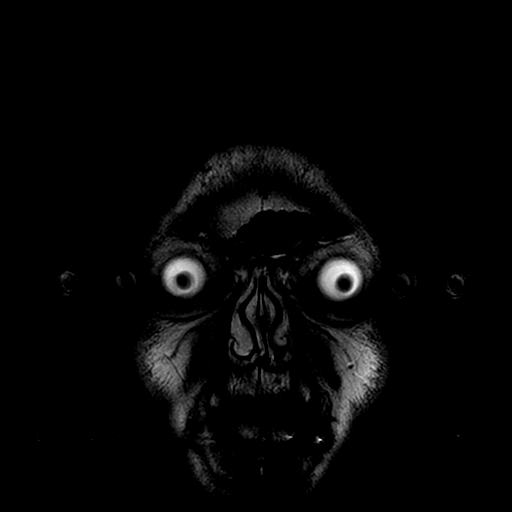

[Series 300: DWI · axial · 3.0mm · 1.09mm/px · z∈[+12,+170]mm · 3 of 55 slices shown (3 of 4)]
[im 1/55]
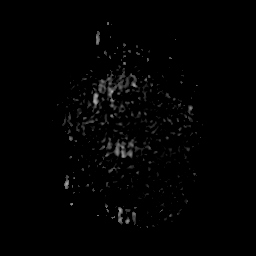
[im 28/55]
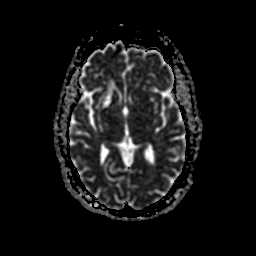
[im 55/55]
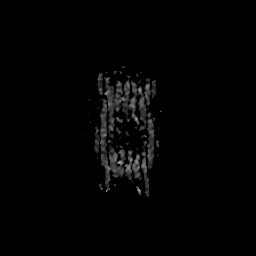

[Series 1000: DWI · coronal · 4.0mm · 1.09mm/px · 2 of 46 slices shown (4 of 4)]
[im 1/46]
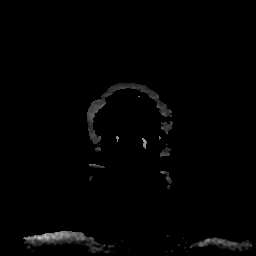
[im 46/46]
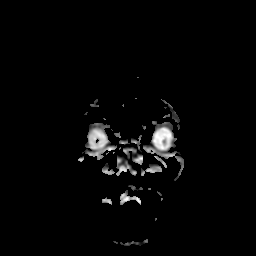

[28 of 48 positions shown; findings below may reference images not displayed]

FINDINGS: MRI HEAD FINDINGS

Brain: The left paramedian pontine infarct is slightly larger on
today's study. Associated T2 signal changes are present. Right
paramedian white matter changes are stable. No other acute infarct
is present. Remote infarct involving the right lentiform nucleus and
periventricular white matter is again noted. Periventricular and
subcortical T2 hyperintensities bilaterally are otherwise stable.

No acute hemorrhage or mass lesion is present.

Vascular: Flow is present in the major intracranial arteries.

Skull and upper cervical spine: The craniocervical junction is
normal. Upper cervical spine is within normal limits. Marrow signal
is unremarkable.

Sinuses/Orbits: Mild mucosal thickening is present throughout the
ethmoid air cells, inferior maxillary sinuses, and sphenoid sinuses.
The mastoid air cells are clear. Globes and orbits are within normal
limits.

MRA HEAD FINDINGS

Severe stenosis of the posterior left M2 origin is again seen. The
internal carotid arteries are within normal limits scratched at the
internal carotid arteries demonstrate some atherosclerotic change
through the cavernous segments, stable the A1 segments are normal.
The M1 segments are normal. MCA bifurcations are otherwise normal.
The ACA and MCA branch vessels demonstrate some distal small vessel
disease.

The right vertebral artery is slightly dominant to the left. There
is a high-grade stenosis at the origin of the right PICA, stable.
Left AICA is dominant. The basilar artery is within normal limits.
Both posterior cerebral arteries originate from the basilar tip.
There is mild proximal right P1 segment stenosis. A severe more
distal right P2 segment stenosis present. There is a moderate distal
left P2 segment stenosis.

MRA NECK FINDINGS

The time-of-flight images demonstrate no significant flow
disturbance at either carotid bifurcation to suggest a significant
stenosis. There is a common origin of the left common carotid artery
and the innominate artery. Flow is antegrade in the vertebral
arteries bilaterally. The right vertebral artery is dominant. No
definite stenosis is present.
IMPRESSION: 1. Slight increase in size of left paramedian pontine infarct.
2. Otherwise stable atrophy and white matter disease bilaterally.
3. No new intracranial abnormality.
4. Remote right lentiform nucleus and periventricular white matter
infarcts are stable.
5. Severe stenosis of the posterior left M2 origin.
6. Moderate to high-grade stenosis of the distal right P2 segment.
7. Moderate distal left P2 segment stenosis.
8. No other significant proximal stenosis, aneurysm, or branch
vessel occlusion within the Circle of Willis.

## 2019-01-22 MED ORDER — CLOPIDOGREL BISULFATE 75 MG PO TABS
75.0000 mg | ORAL_TABLET | Freq: Every day | ORAL | Status: DC
  Administered 2019-01-22 – 2019-01-23 (×2): 75 mg via ORAL
  Filled 2019-01-22 (×2): qty 1

## 2019-01-22 MED ORDER — ATORVASTATIN CALCIUM 80 MG PO TABS
80.0000 mg | ORAL_TABLET | Freq: Every day | ORAL | Status: DC
  Administered 2019-01-22: 20:00:00 80 mg via ORAL
  Filled 2019-01-22 (×2): qty 1
  Filled 2019-01-22: qty 2

## 2019-01-22 MED ORDER — AMLODIPINE BESYLATE 10 MG PO TABS
10.0000 mg | ORAL_TABLET | Freq: Every day | ORAL | Status: DC
  Administered 2019-01-22 – 2019-01-23 (×2): 10 mg via ORAL
  Filled 2019-01-22 (×3): qty 1

## 2019-01-22 MED ORDER — SODIUM CHLORIDE 0.9% FLUSH
3.0000 mL | Freq: Once | INTRAVENOUS | Status: AC
  Administered 2019-01-22: 3 mL via INTRAVENOUS

## 2019-01-22 MED ORDER — TAMSULOSIN HCL 0.4 MG PO CAPS
0.4000 mg | ORAL_CAPSULE | Freq: Every day | ORAL | Status: DC
  Administered 2019-01-22 – 2019-01-23 (×2): 0.4 mg via ORAL
  Filled 2019-01-22 (×2): qty 1

## 2019-01-22 MED ORDER — ASPIRIN 81 MG PO TBEC
81.0000 mg | DELAYED_RELEASE_TABLET | Freq: Every day | ORAL | Status: DC
  Administered 2019-01-22: 20:00:00 81 mg via ORAL
  Filled 2019-01-22 (×4): qty 1

## 2019-01-22 MED ORDER — INSULIN ASPART 100 UNIT/ML ~~LOC~~ SOLN
0.0000 [IU] | Freq: Three times a day (TID) | SUBCUTANEOUS | Status: DC
  Administered 2019-01-23: 13:00:00 5 [IU] via SUBCUTANEOUS
  Administered 2019-01-23: 2 [IU] via SUBCUTANEOUS
  Administered 2019-01-23: 17:00:00 5 [IU] via SUBCUTANEOUS
  Filled 2019-01-22: qty 0.15

## 2019-01-22 MED ORDER — LORAZEPAM 2 MG/ML IJ SOLN
1.0000 mg | Freq: Once | INTRAMUSCULAR | Status: AC | PRN
  Administered 2019-01-22: 15:00:00 1 mg via INTRAVENOUS
  Filled 2019-01-22: qty 1

## 2019-01-22 MED ORDER — ENOXAPARIN SODIUM 40 MG/0.4ML ~~LOC~~ SOLN
40.0000 mg | SUBCUTANEOUS | Status: DC
  Administered 2019-01-22: 20:00:00 40 mg via SUBCUTANEOUS
  Filled 2019-01-22: qty 0.4

## 2019-01-22 MED ORDER — INSULIN ASPART 100 UNIT/ML ~~LOC~~ SOLN
0.0000 [IU] | Freq: Every day | SUBCUTANEOUS | Status: DC
  Administered 2019-01-22: 22:00:00 2 [IU] via SUBCUTANEOUS
  Filled 2019-01-22: qty 0.05

## 2019-01-22 NOTE — ED Notes (Signed)
Spoke with MRI. MRI ETA 2 hours. Patient is claustrophobic.

## 2019-01-22 NOTE — ED Notes (Addendum)
Non emergency neuro stroke consult called to cart 1.

## 2019-01-22 NOTE — ED Notes (Signed)
Patient transported to CT 

## 2019-01-22 NOTE — Consult Note (Signed)
[facility] TeleNeurology Consult Services  Stat Consult  Date of Service:   01/22/2019 11:01:02  Impression:     .  Rule Out Acute Ischemic Stroke  Comments/Sign-Out: acute worsening of slurred speech and imbalance after recent L pontine infarct. DDx worsening of L pontine infarct vs small hemorrhagic conversion vs fluctuation of stroke symptoms from infection/metabolic derangement. CT head pending to eval for hemorrhage.  CT HEAD: Not Reviewed pending  Metrics: [facility] Notification Time: 01/22/2019 10:58:23 Stamp Time: 01/22/2019 11:01:02 Callback Response Time: 01/22/2019 11:05:35 Video Start Time: 01/22/2019 11:40:31 Video End Time: 01/22/2019 11:53:25  Our recommendations are outlined below.  Recommendations:     .  continue ASA/plavix if CT head is neg for hemorrhage   Imaging Studies:     .  MRI Head  Therapies:     .  Physical Therapy, Occupational Therapy, Speech Therapy Assessment When Applicable  Other WorkUp:     .  Infectious/metabolic workup per primary team  Disposition: Neurology Follow Up Recommended  Sign Out:     .  Discussed with Emergency Department Provider  ----------------------------------------------------------------------------------------------------  Chief Complaint: imbalance  History of Present Illness: Patient is a 63 year old Male.  60 man admitted 10/12 with pontine CVA with slurred speech and imbalance. He was improving until he went to bed last night at approx 2300. He woke at 0400 feeling off balance and went back to bed. At 0800 he was noted to have slurred speech along with gait issues secondary to dizziness. He takes ASA/plavix. MRI shows 9 mm L pontine infarct. MRA head showed severe stenosis origin L M2 branches. Moderate to severe stenosis R PCA at P1/P2 jxn. Exam rue finger to nose difficulty. Mild dysarthria.    Past Medical History:     . Diabetes Mellitus     . Hyperlipidemia     .  Stroke     . There is NO history of Hypertension     . There is NO history of Coronary Artery Disease  Anticoagulant use:  No  Antiplatelet use: ASA/plavix Examination: BP(137/81), Pulse(63), Blood Glucose(185) 1A: Level of Consciousness - Alert; keenly responsive + 0 1B: Ask Month and Age - Both Questions Right + 0 1C: Blink Eyes & Squeeze Hands - Performs Both Tasks + 0 2: Test Horizontal Extraocular Movements - Normal + 0 3: Test Visual Fields - No Visual Loss + 0 4: Test Facial Palsy (Use Grimace if Obtunded) - Minor paralysis (flat nasolabial fold, smile asymmetry) + 1 5A: Test Left Arm Motor Drift - No Drift for 10 Seconds + 0 5B: Test Right Arm Motor Drift - No Drift for 10 Seconds + 0 6A: Test Left Leg Motor Drift - No Drift for 5 Seconds + 0 6B: Test Right Leg Motor Drift - No Drift for 5 Seconds + 0 7: Test Limb Ataxia (FNF/Heel-Shin) - Ataxia in 2 Limbs + 2 8: Test Sensation - Normal; No sensory loss + 0 9: Test Language/Aphasia - Normal; No aphasia + 0 10: Test Dysarthria - Mild-Moderate Dysarthria: Slurring but can be understood + 1 11: Test Extinction/Inattention - No abnormality + 0  NIHSS Score: 4   Due to the immediate potential for life-threatening deterioration due to underlying acute neurologic illness, I spent 20 minutes providing critical care. This time includes time for face to face visit via telemedicine, review of medical records, imaging studies and discussion of findings with providers, the patient and/or family.   Dr Hal Morales   [facility] [phone]  Case [id number]

## 2019-01-22 NOTE — ED Provider Notes (Addendum)
Medical screening examination/treatment/procedure(s) were conducted as a shared visit with non-physician practitioner(s) and myself.  I personally evaluated the patient during the encounter.   Pt seen by me last week.  Admitted for stroke, tia workup found to have an acute stroke.  Pt states he went to bed normal at 11pm.    He woke up at 4am feeling dizzy, noticed difficulty with his speech again.  On exam pt does have aphasia.  Sx concerning for stroke.  12 hours from last normal.  NO neglect.  No weakness appreciated.  Doubt LVO.  WIll proceed with evaluation for recurrent neuro sx, recent stroke.   Dorie Rank, MD 01/22/19 1120  Case discussed with Tele neurologist.  Recommends admission, MRI, MRA.  CT scan ordered although not performed yet.  WIll complete that to rule out hemorrhage.   No indication for TPA, or acute vascular intervention.   Dorie Rank, MD 01/22/19 1159

## 2019-01-22 NOTE — ED Triage Notes (Signed)
C/O slurred speech at 4am this morning.   C/O right sided weakness  Per patient had a stroke last week.    78.9kg actual weight    A/Ox4

## 2019-01-22 NOTE — ED Notes (Signed)
Napp, MD at bedside.

## 2019-01-22 NOTE — ED Notes (Signed)
Sam, PA at bedside 

## 2019-01-22 NOTE — ED Notes (Signed)
Report given to Brittany, RN.

## 2019-01-22 NOTE — ED Notes (Addendum)
Per Sam, [place] patient may eat if passed swallow screen. Patient given sandwich and apple juice.    Wife at bedside given snacks as well.

## 2019-01-22 NOTE — ED Notes (Addendum)
Tele neuro at bedside. Neuro thinks patient should get CT, MRI, and be admitted.

## 2019-01-22 NOTE — ED Provider Notes (Signed)
[facility] DEPT Provider Note   CSN: [id number] Arrival date & time: 01/22/19  1050    History   Chief Complaint Off balance & slurred speech  HPI Jerry Moran is a 63 y.o. male with a hx of DM, HTN, & prior stroke who presents to the ED for evaluation of feeling off balance since 0400AM & slurred speech since 0800AM today. Patient went to bed feeling normal last night @ 2300, he woke up at 4AM to take the dog out and felt that his balance was off, but his speech was normal. He went back to sleep and woke up @ 8AM with continued balance issues & slurred speech/trouble getting words out. Sxs constant since onset. No alleviating factors, balance issues aggravated w/ ambulation.. Denies visual disturbance, numbness, weakness, headache, chest pain, or dyspnea.   Recent admission 01/13/19-01/16/19 & found to have L paramedian pontine infarct secondary to small vessel disease--> states he was not having his slurred speech or off balance sxs when discharged from the hospital.     HPI  Past Medical History:  Diagnosis Date  . Diabetes mellitus without complication ([place])   . Hypertension   . Stroke [facility])     Patient Active Problem List   Diagnosis Date Noted  . Slurred speech 01/14/2019  . TIA (transient ischemic attack) 01/14/2019    History reviewed. No pertinent surgical history.      Home Medications    Prior to Admission medications   Medication Sig Start Date End Date Taking? Authorizing Provider  amLODipine (NORVASC) 10 MG tablet Take 1 tablet (10 mg total) by mouth daily. 01/16/19   Kayleen Memos, DO  aspirin EC 81 MG EC tablet Take 1 tablet (81 mg total) by mouth daily. 01/17/19 05/17/19  Kayleen Memos, DO  atorvastatin (LIPITOR) 80 MG tablet Take 1 tablet (80 mg total) by mouth daily at 6 PM. 01/16/19 02/15/19  Kayleen Memos, DO  blood glucose meter kit and supplies KIT Dispense based on patient and insurance preference. Use up to  four times daily as directed. (FOR ICD-9 250.00, 250.01). 01/16/19   Kayleen Memos, DO  clopidogrel (PLAVIX) 75 MG tablet Take 1 tablet (75 mg total) by mouth daily for 21 days. 01/17/19 02/07/19  Kayleen Memos, DO  Insulin Detemir (LEVEMIR) 100 UNIT/ML Pen Inject 12 Units into the skin daily. 01/16/19   Kayleen Memos, DO  Insulin Pen Needle 32G X 8 MM MISC 50 each by Does not apply route every morning. 01/16/19   Kayleen Memos, DO  lisinopril (ZESTRIL) 10 MG tablet Take 1 tablet (10 mg total) by mouth daily. 01/16/19   Kayleen Memos, DO  metFORMIN (GLUCOPHAGE) 500 MG tablet Take 2 tablets (1,000 mg total) by mouth 2 (two) times daily with a meal. 01/16/19 02/15/19  Kayleen Memos, DO  tamsulosin (FLOMAX) 0.4 MG CAPS capsule Take 0.4 mg by mouth daily.    [provider]    Family History No family history on file.  Social History Social History   Tobacco Use  . Smoking status: Never Smoker  . Smokeless tobacco: Never Used  Substance Use Topics  . Alcohol use: Never    Frequency: Never  . Drug use: Never     Allergies   Patient has no known allergies.   Review of Systems Review of Systems  Constitutional: Negative for chills and fever.  Eyes: Negative for visual disturbance.  Respiratory: Negative for shortness of breath.  Cardiovascular: Negative for chest pain.  Gastrointestinal: Negative for abdominal pain and vomiting.  Neurological: Positive for dizziness (off balance) and speech difficulty. Negative for seizures, syncope, weakness, numbness and headaches.  All other systems reviewed and are negative.    Physical Exam Updated Vital Signs BP (!) 151/84   Pulse 65   Temp 97.9 F (36.6 C) (Oral)   Resp 18   SpO2 99%    Physical Exam Vitals signs and nursing note reviewed.  Constitutional:      General: He is not in acute distress.    Appearance: He is well-developed. He is not toxic-appearing.  HENT:     Head: Normocephalic and atraumatic.      Mouth/Throat:     Comments: Uvula midline.  Eyes:     General:        Right eye: No discharge.        Left eye: No discharge.     Extraocular Movements: Extraocular movements intact.     Conjunctiva/sclera: Conjunctivae normal.     Pupils: Pupils are equal, round, and reactive to light.  Neck:     Musculoskeletal: Neck supple.  Cardiovascular:     Rate and Rhythm: Normal rate and regular rhythm.  Pulmonary:     Effort: Pulmonary effort is normal. No respiratory distress.     Breath sounds: Normal breath sounds. No wheezing, rhonchi or rales.  Abdominal:     General: There is no distension.     Palpations: Abdomen is soft.     Tenderness: There is no abdominal tenderness. There is no guarding or rebound.  Skin:    General: Skin is warm and dry.     Findings: No rash.  Neurological:     Mental Status: He is alert.     Comments: Alert. Not aphasic, very minimal dysarthria. No facial droop. CNIII-XII grossly intact. Bilateral upper and lower extremities' sensation grossly intact. 5/5 symmetric strength with grip strength and with plantar and dorsi flexion bilaterally. Poor coordination w/ RUE finger to nose, LUE normal. Negative pronator drift, no drift w/ raise of bilateral lower extremities. Negative Romberg sign. Mildly ataxic.   Psychiatric:        Behavior: Behavior normal.    ED Treatments / Results  Labs (all labs ordered are listed, but only abnormal results are displayed) Labs Reviewed  BASIC METABOLIC PANEL - Abnormal; Notable for the following components:      Result Value   Glucose, Bld 174 (*)    Creatinine, Ser 1.27 (*)    GFR calc non Af Amer 59 (*)    All other components within normal limits  CBC - Abnormal; Notable for the following components:   RBC 4.02 (*)    Hemoglobin 12.6 (*)    HCT 37.0 (*)    All other components within normal limits  CBG MONITORING, ED - Abnormal; Notable for the following components:   Glucose-Capillary 185 (*)    All other  components within normal limits  I-STAT CHEM 8, ED - Abnormal; Notable for the following components:   Glucose, Bld 168 (*)    Hemoglobin 12.9 (*)    HCT 38.0 (*)    All other components within normal limits  SARS CORONAVIRUS 2 (TAT 6-24 HRS)  ETHANOL  PROTIME-INR  APTT  DIFFERENTIAL  URINALYSIS, ROUTINE W REFLEX MICROSCOPIC  RAPID URINE DRUG SCREEN, HOSP PERFORMED    EKG EKG Interpretation  Date/Time:  Wednesday January 22 2019 11:03:08 EDT Ventricular Rate:  65 PR Interval:  QRS Duration: 134 QT Interval:  404 QTC Calculation: 420 R Axis:   70 Text Interpretation:  Sinus rhythm Right bundle branch block No significant change since last tracing Confirmed by Dorie Rank [phone]) on 01/22/2019 11:21:02 AM   Radiology Ct Head Wo Contrast  Result Date: 01/22/2019 CLINICAL DATA:  Speech difficulty, history of recent pontine infarct EXAM: CT HEAD WITHOUT CONTRAST TECHNIQUE: Contiguous axial images were obtained from the base of the skull through the vertex without intravenous contrast. COMPARISON:  01/13/2019 FINDINGS: Brain: Faint hypodensity is noted in the left aspect of the pons consistent with the known prior infarct. Areas of chronic ischemia are again seen adjacent to the right lateral ventricle. No findings to suggest acute hemorrhage, acute infarction or space-occupying mass lesion are noted. Vascular: No hyperdense vessel or unexpected calcification. Skull: Normal. Negative for fracture or focal lesion. Sinuses/Orbits: No acute finding. Other: Scalp scarring is again seen and stable. IMPRESSION: Changes consistent with the recent left pontine infarct. Chronic ischemic changes are noted. No new focal abnormality is seen. Electronically Signed   By: Inez Catalina M.D.   On: 01/22/2019 12:52    Procedures Procedures (including critical care time)  Medications Ordered in ED Medications  sodium chloride flush (NS) 0.9 % injection 3 mL (3 mLs Intravenous Given 01/22/19 1108)      Initial Impression / Assessment and Plan / ED Course  I have reviewed the triage vital signs and the nursing notes.  Pertinent labs & imaging results that were available during my care of the patient were reviewed by me and considered in my medical decision making (see chart for details).   Patient presents to the ED for evaluation of slurred speech & feeling off balance, last known normal 23:00 last night. Mild dysarthria, poor coordination with RUE finger to nose, & mild ataxia. No focal strength/sensation deficits. > 4.5 hours since onset, within 24 hours however VAN negative therefore code stroke was not called. BP elevated- improved w/ repeat readings, otherwise vitals WNL.   Work-up reviewed: EKG: NO significant change since last tracing.  CBC: Anemia slightly worse than prior.  No leukocytosis. CMP: Hyperglycemia without anion gap elevation or acidosis.  Creatinine elevated but similar to prior ranges. APTT/PT/INR: WNL Ethanol: WNL.   Case discussed with Tele neurologist.  Recommends admission, MRI, MRA.  CT scan ordered although not performed yet.  WIll complete that to rule out hemorrhage. No indication for TPA, or acute vascular intervention.  CT head: Changes consistent with the recent left pontine infarct. Chronic ischemic changes are noted. No new focal abnormality is seen.   13:30: CONSULT: Discussed w/ hospitalist Dr. Zigmund Daniel- accepts admission.   This is a shared visit with supervising physician Dr. Tomi Bamberger who has independently evaluated patient, provided guidance, & is in agreement.   Jeovany Pietila was evaluated in Emergency Department on 01/22/2019 for the symptoms described in the history of present illness. He/she was evaluated in the context of the global COVID-19 pandemic, which necessitated consideration that the patient might be at risk for infection with the SARS-CoV-2 virus that causes COVID-19. Institutional protocols and algorithms that pertain to the  evaluation of patients at risk for COVID-19 are in a state of rapid change based on information released by regulatory bodies including the CDC and federal and state organizations. These policies and algorithms were followed during the patient's care in the ED.   Final Clinical Impressions(s) / ED Diagnoses   Final diagnoses:  Stroke-like symptoms    ED Discharge Orders  None       Amaryllis Dyke, [name] 01/22/19 1401    Dorie Rank, MD 01/23/19 [phone]

## 2019-01-22 NOTE — ED Notes (Signed)
Spoke to MRI. Patient will be done in about 15 min.

## 2019-01-22 NOTE — ED Notes (Signed)
Patient transported to MRI 

## 2019-01-22 NOTE — ED Notes (Signed)
Wife has been called and updated. Wife made aware she can come visit.

## 2019-01-22 NOTE — ED Notes (Signed)
Provided pt update

## 2019-01-22 NOTE — H&P (Signed)
History and Physical    Jerry Moran YME:[id number] DOB: Apr 14, 1955 DOA: 01/22/2019  PCP: [facility] Patient coming from: Home, he lives with his wife at home. Chief Complaint: Slurry speech and unsteady gait  HPI: Jerry Moran is a 63 y.o. 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  According to wife patient has been taking his medications as prescribed.  However early this morning at 4 AM he felt off balance and was able to hold onto the dresser to avoid a fall and noted to have slurred speech.  No changes with his vision no headaches he did not hit his head.  No difficulty swallowing.  No chest pain shortness of breath nausea vomiting.  No abdominal pain urinary complaints.  No incontinence.  His wife checked his blood sugar and blood pressure at home this morning prior to bringing him to the ER his blood pressure was 138/78 blood sugar was 137.  ED Course: ED physician spoke with teleneurologist who recommended to repeat MRI MRA and to continue aspirin and Plavix.  CT of the head showed no acute changes.  Changes consistent with the recent left pontine infarct noted.  He had a [place] sandwich in the ER he swallowed without any difficulty.  White count 5.8 hemoglobin 12.9 platelet count 179, sodium 138 potassium 3.6 BUN 21 creatinine 1.20 which is elevated from his recent discharge which was 0.98.  Review of Systems: As per HPI otherwise all other systems reviewed and are negative  Ambulatory Status: Patient was ambulatory after discharge from the hospital recently 01/16/2019.  Past Medical History:  Diagnosis Date  . Diabetes mellitus without complication ([place])   . Hypertension   . Stroke [facility])      History reviewed. No pertinent surgical history.  Social History   Socioeconomic History  . Marital status: Married    Spouse name: Not on file  . Number of children: Not on file  . Years of education: Not on file  . Highest education level: Not on file  Occupational History  . Not on file  Social Needs  . Financial resource strain: Not on file  . Food insecurity    Worry: Not on file    Inability: Not on file  . Transportation needs    Medical: Not on file    Non-medical: Not on file  Tobacco Use  . Smoking status: Never Smoker  . Smokeless tobacco: Never Used  Substance and Sexual Activity  . Alcohol use: Never    Frequency: Never  . Drug use: Never  . Sexual activity: Not on file  Lifestyle  . Physical activity    Days per week: Not on file    Minutes per session: Not on file  . Stress: Not on file  Relationships  . Social Herbalist on phone: Not on file    Gets together: Not on file    Attends religious service: Not on file    Active member of club or organization: Not on file    Attends meetings of clubs or organizations: Not on file    Relationship status: Not on file  . Intimate partner violence    Fear of current or ex partner: Not on file    Emotionally abused:  Not on file    Physically abused: Not on file    Forced sexual activity: Not on file  Other Topics Concern  . Not on file  Social History Narrative  . Not on file    No Known Allergies  History reviewed. No pertinent family history.  Prior to Admission medications   Medication Sig Start Date End Date Taking? Authorizing Provider  amLODipine (NORVASC) 10 MG tablet Take 1 tablet (10 mg total) by mouth daily. 01/16/19  Yes Kayleen Memos, DO  aspirin EC 81 MG EC tablet Take 1 tablet (81 mg total) by mouth daily. 01/17/19 05/17/19 Yes Kayleen Memos, DO  atorvastatin (LIPITOR) 80 MG tablet Take 1 tablet (80 mg total) by mouth daily at 6 PM. 01/16/19 02/15/19 Yes Kayleen Memos, DO   clopidogrel (PLAVIX) 75 MG tablet Take 1 tablet (75 mg total) by mouth daily for 21 days. 01/17/19 02/07/19 Yes Hall, Carole N, DO  lisinopril (ZESTRIL) 10 MG tablet Take 1 tablet (10 mg total) by mouth daily. 01/16/19  Yes Kayleen Memos, DO  metFORMIN (GLUCOPHAGE) 500 MG tablet Take 2 tablets (1,000 mg total) by mouth 2 (two) times daily with a meal. 01/16/19 02/15/19 Yes Hall, Carole N, DO  tamsulosin (FLOMAX) 0.4 MG CAPS capsule Take 0.4 mg by mouth daily.   Yes [provider]  blood glucose meter kit and supplies KIT Dispense based on patient and insurance preference. Use up to four times daily as directed. (FOR ICD-9 250.00, 250.01). 01/16/19   Kayleen Memos, DO  Insulin Detemir (LEVEMIR) 100 UNIT/ML Pen Inject 12 Units into the skin daily. Patient not taking: Reported on 01/22/2019 01/16/19   Kayleen Memos, DO  Insulin Pen Needle 32G X 8 MM MISC 50 each by Does not apply route every morning. 01/16/19   Kayleen Memos, DO    Physical Exam: Vitals:   01/22/19 1345 01/22/19 1400 01/22/19 1415 01/22/19 1445  BP: (!) 151/84 (!) 153/85 (!) 145/88 (!) 159/85  Pulse: 65 63 64 67  Resp: 18 18 17 18   Temp:      TempSrc:      SpO2: 99% 100% 99% 98%     . General: Appears calm and comfortable . Eyes:  PERRL, EOMI, normal lids, iris . ENT:  grossly normal hearing, lips & tongue, mmm . Neck:  no LAD, masses or thyromegaly . Cardiovascular:  RRR, no m/r/g. No LE edema.  Marland Kitchen Respiratory: CTA bilaterally, no w/r/r. Normal respiratory effort. . Abdomen:  soft, ntnd, NABS . Skin: no rash or induration seen on limited exam . Musculoskeletal: grossly normal tone BUE/BLE, good ROM, no bony abnormality . Psychiatric:  grossly normal mood and affect, speech fluent and appropriate, AOx3 . Neurologic: Moves all extremities, mild dysarthria, ataxic in the ER Labs on Admission: I have personally reviewed following labs and imaging studies  CBC: Recent Labs  Lab 01/22/19 1106 01/22/19 1124   WBC 5.8  --   NEUTROABS 3.8  --   HGB 12.6* 12.9*  HCT 37.0* 38.0*  MCV 92.0  --   PLT 179  --    Basic Metabolic Panel: Recent Labs  Lab 01/16/19 0643 01/22/19 1106 01/22/19 1124  NA 136 135 138  K 3.4* 3.5 3.6  CL 107 101 102  CO2 20* 22  --   GLUCOSE 158* 174* 168*  BUN 7* 23 21  CREATININE 0.98 1.27* 1.20  CALCIUM 8.1* 9.0  --    GFR: Estimated Creatinine Clearance:  61.4 mL/min (by C-G formula based on SCr of 1.2 mg/dL). Liver Function Tests: No results for input(s): AST, ALT, ALKPHOS, BILITOT, PROT, ALBUMIN in the last 168 hours. No results for input(s): LIPASE, AMYLASE in the last 168 hours. No results for input(s): AMMONIA in the last 168 hours. Coagulation Profile: Recent Labs  Lab 01/22/19 1106  INR 1.0   Cardiac Enzymes: No results for input(s): CKTOTAL, CKMB, CKMBINDEX, TROPONINI in the last 168 hours. BNP (last 3 results) No results for input(s): PROBNP in the last 8760 hours. HbA1C: No results for input(s): HGBA1C in the last 72 hours. CBG: Recent Labs  Lab 01/15/19 1619 01/15/19 2206 01/16/19 0630 01/16/19 1221 01/22/19 1102  GLUCAP 268* 158* 148* 205* 185*   Lipid Profile: No results for input(s): CHOL, HDL, LDLCALC, TRIG, CHOLHDL, LDLDIRECT in the last 72 hours. Thyroid Function Tests: No results for input(s): TSH, T4TOTAL, FREET4, T3FREE, THYROIDAB in the last 72 hours. Anemia Panel: No results for input(s): VITAMINB12, FOLATE, FERRITIN, TIBC, IRON, RETICCTPCT in the last 72 hours. Urine analysis:    Component Value Date/Time   COLORURINE STRAW (A) 01/13/2019 2055   APPEARANCEUR CLEAR 01/13/2019 2055   LABSPEC 1.028 01/13/2019 2055   PHURINE 6.0 01/13/2019 2055   GLUCOSEU >=500 (A) 01/13/2019 2055   HGBUR NEGATIVE 01/13/2019 2055   [place] 01/13/2019 2055   [place] 01/13/2019 2055   PROTEINUR 30 (A) 01/13/2019 2055   NITRITE NEGATIVE 01/13/2019 2055   LEUKOCYTESUR NEGATIVE 01/13/2019 2055    Creatinine  Clearance: Estimated Creatinine Clearance: 61.4 mL/min (by C-G formula based on SCr of 1.2 mg/dL).  Sepsis Labs: @LABRCNTIP (procalcitonin:4,lacticidven:4) ) Recent Results (from the past 240 hour(s))  SARS Coronavirus 2 by RT PCR (hospital order, performed in [place] hospital lab)     Status: None   Collection Time: 01/13/19 11:07 PM  Result Value Ref Range Status   SARS Coronavirus 2 NEGATIVE NEGATIVE Final    Comment: (NOTE) If result is NEGATIVE SARS-CoV-2 target nucleic acids are NOT DETECTED. The SARS-CoV-2 RNA is generally detectable in upper and lower  respiratory specimens during the acute phase of infection. The lowest  concentration of SARS-CoV-2 viral copies this assay can detect is 250  copies / mL. A negative result does not preclude SARS-CoV-2 infection  and should not be used as the sole basis for treatment or other  patient management decisions.  A negative result may occur with  improper specimen collection / handling, submission of specimen other  than nasopharyngeal swab, presence of viral mutation(s) within the  areas targeted by this assay, and inadequate number of viral copies  (<250 copies / mL). A negative result must be combined with clinical  observations, patient history, and epidemiological information. If result is POSITIVE SARS-CoV-2 target nucleic acids are DETECTED. The SARS-CoV-2 RNA is generally detectable in upper and lower  respiratory specimens dur ing the acute phase of infection.  Positive  results are indicative of active infection with SARS-CoV-2.  Clinical  correlation with patient history and other diagnostic information is  necessary to determine patient infection status.  Positive results do  not rule out bacterial infection or co-infection with other viruses. If result is PRESUMPTIVE POSTIVE SARS-CoV-2 nucleic acids MAY BE PRESENT.   A presumptive positive result was obtained on the submitted specimen  and confirmed on repeat  testing.  While 2019 novel coronavirus  (SARS-CoV-2) nucleic acids may be present in the submitted sample  additional confirmatory testing may be necessary for epidemiological  and /  or clinical management purposes  to differentiate between  SARS-CoV-2 and other Sarbecovirus currently known to infect humans.  If clinically indicated additional testing with an alternate test  methodology [phone]) is advised. The SARS-CoV-2 RNA is generally  detectable in upper and lower respiratory sp ecimens during the acute  phase of infection. The expected result is Negative. Fact Sheet for Patients:  StrictlyIdeas.no Fact Sheet for Healthcare Providers: BankingDealers.co.za This test is not yet approved or cleared by the [place] FDA and has been authorized for detection and/or diagnosis of SARS-CoV-2 by FDA under an Emergency Use Authorization (EUA).  This EUA will remain in effect (meaning this test can be used) for the duration of the COVID-19 declaration under Section 564(b)(1) of the Act, 21 U.S.C. section 360bbb-3(b)(1), unless the authorization is terminated or revoked sooner. Performed at [facility], [place] [address] [place], [place] [address]      Radiological Exams on Admission: Ct Head Wo Contrast  Result Date: 01/22/2019 CLINICAL DATA:  Speech difficulty, history of recent pontine infarct EXAM: CT HEAD WITHOUT CONTRAST TECHNIQUE: Contiguous axial images were obtained from the base of the skull through the vertex without intravenous contrast. COMPARISON:  01/13/2019 FINDINGS: Brain: Faint hypodensity is noted in the left aspect of the pons consistent with the known prior infarct. Areas of chronic ischemia are again seen adjacent to the right lateral ventricle. No findings to suggest acute hemorrhage, acute infarction or space-occupying mass lesion are noted. Vascular: No hyperdense vessel or unexpected calcification.  Skull: Normal. Negative for fracture or focal lesion. Sinuses/Orbits: No acute finding. Other: Scalp scarring is again seen and stable. IMPRESSION: Changes consistent with the recent left pontine infarct. Chronic ischemic changes are noted. No new focal abnormality is seen. Electronically Signed   By: Inez Catalina M.D.   On: 01/22/2019 12:52    Assessment/Plan Active Problems:   * No active hospital problems. *    #1 recent left pontine infarct-patient admitted with slurred speech unsteady gait and ataxia.  He was discharged from the hospital after stroke work-up on the 15th of this month.  Wife reports he has been doing well through the day when he started to have the symptoms again.  ED physician called teleneurologist who recommended repeat CT scan and MRI MRA to rule out hemorrhage.  CT scan of the head does not show any evidence of hemorrhage but shows recent left pontine infarct.  MRI/MRA pending.  Continue aspirin and Plavix and Lipitor.  I have placed a call into the neurologist on-call today waiting to hear back from him.  He had stroke work-up a week ago which includes an elevated LDL elevated hemoglobin A1c.  Echo and MRI report per my HPI. I will get a PT OT eval I will hold off on speech evaluation since patient passed bedside swallow evaluation and has no trouble eating.  He might benefit from a loop monitor as an outpatient.  I discussed with Dr. Cheral Marker who recommended an MRI of the brain.  If the MRI is positive for any acute changes please call him.  Observe overnight for worsening of symptoms.   #2 hyperlipidemia increased LDL 165 continue Lipitor 80 mg daily.  #3 type 2 diabetes patient is on metformin at home which I will hold and place him on SSI while in hospital.  His last hemoglobin A1c was 11.5 a week ago.  #4 AKI elevated creatinine compared to a week ago hold metformin and ACE inhibitor and recheck tomorrow.  #5  hypertension continue Norvasc  #6 benign prostatic  hypertrophy continue Flomax.  Estimated body mass index is 26.58 kg/m as calculated from the following:   Height as of 01/13/19: 5' 9"  (1.753 m).   Weight as of 01/13/19: 81.6 kg.   DVT prophylaxis: Lovenox Code Status: Full code Family Communication: Discussed with wife at bedside Disposition Plan: Pending stroke work-up PT evaluation Consults called: I discussed with neurology over the phone if MRI positive for acute changes other than the left pontine infarct he had recently please reconsult neurology. Admission status: Observation  Georgette Shell MD [facility]  If 7PM-7AM, please contact night-coverage [email or web address] Password [email or web address]  01/22/2019, 3:15 PM

## 2019-01-23 DIAGNOSIS — I6330 Cerebral infarction due to thrombosis of unspecified cerebral artery: Secondary | ICD-10-CM

## 2019-01-23 DIAGNOSIS — E785 Hyperlipidemia, unspecified: Secondary | ICD-10-CM

## 2019-01-23 DIAGNOSIS — I10 Essential (primary) hypertension: Secondary | ICD-10-CM | POA: Diagnosis not present

## 2019-01-23 DIAGNOSIS — R2990 Unspecified symptoms and signs involving the nervous system: Secondary | ICD-10-CM | POA: Diagnosis not present

## 2019-01-23 DIAGNOSIS — I639 Cerebral infarction, unspecified: Secondary | ICD-10-CM | POA: Diagnosis not present

## 2019-01-23 DIAGNOSIS — E1169 Type 2 diabetes mellitus with other specified complication: Secondary | ICD-10-CM

## 2019-01-23 LAB — BASIC METABOLIC PANEL
Anion gap: 9 (ref 5–15)
BUN: 18 mg/dL (ref 8–23)
CO2: 24 mmol/L (ref 22–32)
Calcium: 9.1 mg/dL (ref 8.9–10.3)
Chloride: 103 mmol/L (ref 98–111)
Creatinine, Ser: 1.11 mg/dL (ref 0.61–1.24)
GFR calc Af Amer: >60 mL/min (ref >60)
GFR calc non Af Amer: >60 mL/min (ref >60)
Glucose, Bld: 137 mg/dL — ABNORMAL HIGH (ref 70–99)
Potassium: 3.5 mmol/L (ref 3.5–5.1)
Sodium: 136 mmol/L (ref 135–145)

## 2019-01-23 LAB — GLUCOSE, CAPILLARY
Glucose-Capillary: 143 mg/dL — ABNORMAL HIGH (ref 70–99)
Glucose-Capillary: 237 mg/dL — ABNORMAL HIGH (ref 70–99)
Glucose-Capillary: 221 mg/dL — ABNORMAL HIGH (ref 70–99)

## 2019-01-23 MED ORDER — ASPIRIN EC 81 MG PO TBEC
81.0000 mg | DELAYED_RELEASE_TABLET | Freq: Every day | ORAL | Status: DC
  Administered 2019-01-23: 08:00:00 81 mg via ORAL
  Filled 2019-01-23: qty 1

## 2019-01-23 NOTE — Evaluation (Signed)
Occupational Therapy Evaluation Patient Details Name: Jerry Moran MRN: [id number] DOB: 1955/11/11 Today's Date: 01/23/2019    History of Present Illness 63 year old man admitted with slurred speech and unsteady gait.  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  MRI shows slight increase in L paramedian pontine infarct. PMH:  HTN and DM   Clinical Impression   Pt was admitted for the above.  At Baseline, he has been independent. He is unsteady with walking and did better with RW, although he needs further training to use this correctly.  He may need follow up PT for balance; their evaluation is pending.      Follow Up Recommendations  No OT follow up(supervision for standing/balance)    Equipment Recommendations  (pt may want to get shower seat)    Recommendations for Other Services       Precautions / Restrictions Precautions Precautions: Fall Restrictions Weight Bearing Restrictions: No      Mobility Bed Mobility Overal bed mobility: Independent                Transfers Overall transfer level: Needs assistance Equipment used: None;Rolling walker (2 wheeled) Transfers: Sit to/from Stand Sit to Stand: Min guard         General transfer comment: for safety    Balance Overall balance assessment: Needs assistance           Standing balance-Leahy Scale: Fair Standing balance comment: min guard for safety; unsteady (poor when walking)                           ADL either performed or assessed with clinical judgement   ADL Overall ADL's : Needs assistance/impaired                         Toilet Transfer: Minimal assistance;Ambulation;Comfort height toilet             General ADL Comments: pt needs set up for adls and min guard for standing.  Ambulated to bathroom; pt steadier with RW; however, he lifted it at times and didn't use correctly.  Wife present.  Recommended they get a shower seat vs washing at  the sink for safety     Vision         Perception     Praxis      Pertinent Vitals/Pain Pain Assessment: No/denies pain     Hand Dominance Right   Extremity/Trunk Assessment Upper Extremity Assessment Upper Extremity Assessment: Overall WFL for tasks assessed           Communication Communication Communication: No difficulties   Cognition Arousal/Alertness: Awake/alert Behavior During Therapy: WFL for tasks assessed/performed Overall Cognitive Status: Within Functional Limits for tasks assessed                                     General Comments       Exercises     Shoulder Instructions      Home Living Family/patient expects to be discharged to:: Private residence Living Arrangements: Spouse/significant other Available Help at Discharge: Available 24 hours/day               Bathroom Shower/Tub: Occupational psychologist: Standard     Home Equipment: None   Additional Comments: pt has a ledge next to commode which he can  push up from. No bars nor seat in shower      Prior Functioning/Environment Level of Independence: Independent                 OT Problem List: Impaired balance (sitting and/or standing);Decreased knowledge of use of DME or AE      OT Treatment/Interventions: Self-care/ADL training;DME and/or AE instruction;Balance training;Patient/family education    OT Goals(Current goals can be found in the care plan section) Acute Rehab OT Goals OT Goal Formulation: All assessment and education complete, DC therapy ADL Goals Pt Will Transfer to Toilet: with supervision;regular height toilet;ambulating Pt Will Perform Tub/Shower Transfer: with supervision;Shower transfer;shower seat;ambulating Additional ADL Goal #1: pt will complete adl with set up/supervision  OT Frequency: Min 2X/week   Barriers to D/C:            Co-evaluation              AM-PAC OT "6 Clicks" Daily Activity     Outcome  Measure Help from another person eating meals?: None Help from another person taking care of personal grooming?: A Little Help from another person toileting, which includes using toliet, bedpan, or urinal?: A Little Help from another person bathing (including washing, rinsing, drying)?: A Little Help from another person to put on and taking off regular upper body clothing?: A Little Help from another person to put on and taking off regular lower body clothing?: A Little 6 Click Score: 19   End of Session    Activity Tolerance: Patient tolerated treatment well Patient left: in chair;with call bell/phone within reach;with family/visitor present  OT Visit Diagnosis: Muscle weakness (generalized) (M62.81)                Time: [id number] OT Time Calculation (min): 17 min Charges:  OT General Charges $OT Visit: 1 Visit OT Evaluation $OT Eval Low Complexity: 1 Low  Lesle Chris, OTR/L Acute Rehabilitation Services [phone] [facility] pager [phone] office 01/23/2019  [place] 01/23/2019, 2:19 PM

## 2019-01-23 NOTE — Care Management Obs Status (Signed)
[place] NOTIFICATION   Patient Details  Name: Jerry Moran MRN: [id number] Date of Birth: Feb 08, 1956   Medicare Observation Status Notification Given:       Trish Mage, Marlinda Mike 01/23/2019, 3:42 PM

## 2019-01-23 NOTE — Discharge Instructions (Signed)

## 2019-01-23 NOTE — Evaluation (Signed)
Speech Language Pathology Evaluation Patient Details Name: Jerry Moran MRN: [id number] DOB: [birth year] Today's Date: 01/23/2019 Time: [id number] SLP Time Calculation (min) (ACUTE ONLY): 30 min  Problem List:  Patient Active Problem List   Diagnosis Date Noted  . CVA (cerebral vascular accident) ([place]) 01/22/2019  . Type 2 diabetes mellitus with hyperlipidemia ([place]) 01/22/2019  . Essential hypertension 01/22/2019  . Elevated serum creatinine 01/22/2019  . Stroke-like symptoms   . Slurred speech 01/14/2019  . TIA (transient ischemic attack) 01/14/2019   Past Medical History:  Past Medical History:  Diagnosis Date  . Diabetes mellitus without complication ([place])   . Hypertension   . Stroke [facility])    Past Surgical History: History reviewed. No pertinent surgical history. HPI:  63 year old man admitted with slurred speech and unsteady gait.  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  MRI shows slight increase in L paramedian pontine infarct. PMH:  HTN and DM   Assessment / Plan / Recommendation Clinical Impression  Pt was seen for a cognitive-linguistic evaluation and he presents with minimal dysarthria, likely baseline from CVA on 01/15/19.  Pt recalled previous dysarthria education and he independently demonstrated use of the speech intelligibility strategies "slow" and "over-articulate".   His speech was >95% intelligible at the word, sentence, and conversation level.  Pt additionally presents with functional cognitive and expressive/receptive language abilities per formal and informal evaluation.  No further skilled ST is warranted at this time.  Please re-consult if additional needs arise.      SLP Assessment  SLP Visit Diagnosis: Dysarthria and anarthria (R47.1)             SLP Evaluation Cognition  Overall Cognitive Status: Within Functional Limits for tasks assessed Arousal/Alertness: Awake/alert Orientation Level: Oriented X4 Attention:  Sustained Sustained Attention: Appears intact Memory: Appears intact Awareness: Appears intact Problem Solving: Appears intact Executive Function: Sequencing;Organizing Sequencing: Appears intact Organizing: Appears intact Safety/Judgment: Appears intact       Comprehension  Auditory Comprehension Overall Auditory Comprehension: Appears within functional limits for tasks assessed    Expression Expression Primary Mode of Expression: Verbal Verbal Expression Overall Verbal Expression: Appears within functional limits for tasks assessed Written Expression Dominant Hand: Right   Oral / Motor  Oral Motor/Sensory Function Overall Oral Motor/Sensory Function: Mild impairment Facial ROM: Within Functional Limits Facial Symmetry: Within Functional Limits Lingual ROM: Within Functional Limits Lingual Symmetry: Abnormal symmetry right Lingual Strength: Reduced Motor Speech Overall Motor Speech: Impaired Respiration: Within functional limits Phonation: Normal Resonance: Within functional limits Articulation: Impaired Level of Impairment: Sentence Intelligibility: Intelligible Motor Planning: Witnin functional limits Motor Speech Errors: Aware Effective Techniques: Slow rate;Over-articulate    Bretta Bang, M.S., [place] Acute Rehabilitation Services Office: [phone]                   [employer] 01/23/2019, 4:40 PM

## 2019-01-23 NOTE — Discharge Summary (Signed)
Physician Discharge Summary  Jerry Moran YSA:[id number] DOB: 09-12-1955  PCP: [facility]  Patient reports that he sees Dr. Ishmael Holter (does not know her first name).  He last saw her on 01/21/2019  Admitted from: Home Discharged to: Home  Admit date: 01/22/2019 Discharge date: 01/23/2019  Recommendations for Outpatient Follow-up:   Follow-up Information    [facility]. Schedule an appointment as soon as possible for a visit in 3 day(s).   Why: To be seen with repeat labs (CBC & BMP).  Please follow-up regarding better management of poorly controlled diabetes.  Patient advised to call tomorrow to be seen as soon as possible. Contact information: [place] [address] [phone]        [facility]. Schedule an appointment as soon as possible for a visit in 2 week(s).   Contact information: [place] [address]  Phone number: [phone]           Home Health: None Equipment/Devices: None.  Kasandra Knudsen was offered by physical therapy but patient reports that he can acquire if needed.  Discharge Condition: Improved and stable CODE STATUS: Full Diet recommendation: Heart healthy & diabetic diet.  Discharge Diagnoses:  Active Problems:   CVA (cerebral vascular accident) ([place])   Type 2 diabetes mellitus with hyperlipidemia ([place])   Essential hypertension   Elevated serum creatinine   Brief Summary: 63 year old male with PMH of recent hospitalization 01/13/2019-01/16/2019 for acute stroke, poorly controlled type II DM, HTN, HLD, reports compliance with all his medications, presented to ED due to feeling off balance, slurred speech and difficulty expressing words.  No visual symptoms or headaches reported.  No difficulty swallowing.  His spouse checked his BP and CBG prior to bringing him to the ER and they were 138/78 mmHg and 137 mg per DL respectively.  He was admitted for  further evaluation and management.  Assessment and plan:  1. 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  EDP discussed with Teleneurology who recommended repeating MRI, MRA brain and to continue aspirin and Plavix.  During recent hospitalization, patient had been seen by neurology and the stroke team and undergone complete stroke evaluation including CT head, MRI brain, MRA of the head and neck, TTE (LVEF 60-65% without source of embolus), LDL 165, A1c 11.5.  Prior to that admission he was not on any antithrombotics.  He was discharged on aspirin 81 mg daily + Plavix 75 mg daily for 3 weeks followed by aspirin alone.  He was independent and did not require therapies at home.  Neurologist office was going to arrange outpatient follow-up in 4 weeks.  CT head 10/21: Changes consistent with the recent left pontine infarct.  Chronic ischemic changes are noted.  No new focal abnormality seen.  MRI brain: Slight increase in size of left paramedian pontine infarct.  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  No new intracranial abnormality.  Remote right lentiform nucleus and periventricular white matter infarcts are seen.  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  Moderate distal left P2 segment stenosis.  No other significant proximal stenosis, aneurysm or branch vessel occlusion within the circle of Willis.  Patient reports improvement in symptoms.  Dysarthria has significantly improved but not yet back to baseline.  Has had some mild difficulty in expressing his thoughts due to speech  difficulty.  Otherwise denies asymmetric limb weakness or gait abnormality.  Evaluated by PT, OT and SLP who do not see any home health needs.  I called and discussed patient's case in detail with Neuro Hospitalist on-call who looked at patient's MRI/MRA  of brain and advised that patient's current symptoms may have been related to slight increase in the size of the recent left paramedian pontine infarct.  However there were no new infarct.  He recommended no further evaluation and continue prior recommendations for DAPT x3 weeks from initial initiation followed by aspirin alone, statins and avoid too tight control of blood pressures targeting SBP in the 130s-150s and outpatient follow-up with neurology (I have sent an ambulatory Neurology referral to [facility]).  Patient's current presentation may have also been precipitated by poorly controlled diabetes.  I counseled patient extensively regarding importance of adequately controlling all his secondary risk factors and he verbalized understanding. 2. Poorly controlled type II DM: Recent A1c 11.5 suggest very poor control.  Reportedly has never been on insulins.  States that on recent discharge, he was given prescription for Levemir but was unable to fill prescription due to cost cost (approximately $400).  He saw his PCP, Dr. Ishmael Holter at the [facility] on 10/20 who advised him to hold off on the insulins, continued prior doses of Metformin without change.  I advised him that ongoing poorly controlled DM is not acceptable and he is at risk for further complications related to this.  I offered to restart him on different form of insulin i.e. 70/30 that he could afford or explore other options with case management.  He was very reluctant and indicated that he would rather follow-up with his PCP and did not want to start insulins at this time.  I advised him to call his PCP tomorrow for early follow-up to address this issue as soon as possible.  He verbalized understanding. 3. Hyperlipidemia: Recent LDL 165, goal <70.  Continue recently started statins. 4. Essential hypertension: Controlled.  Avoid hypotension.  Continue home dose of amlodipine and lisinopril. 5. BPH: Continue Flomax. 6. Acute kidney injury: Mild.   Presented with creatinine of 1.27 which improved to 1.1.  This may be expected in the context of ongoing ACEI use.  Follow BMP closely as outpatient     Consultations:  Discussed in detail with Neuro Hospitalist.  Procedures:  None.   Discharge Instructions  Discharge Instructions    Ambulatory referral to Neurology   Complete by: As directed    An appointment is requested in approximately: 2 weeks   Call MD for:   Complete by: As directed    Recurrent strokelike symptoms.   Diet - low sodium heart healthy   Complete by: As directed    Diet Carb Modified   Complete by: As directed    Increase activity slowly   Complete by: As directed        Medication List    STOP taking these medications   Insulin Detemir 100 UNIT/ML Pen Commonly known as: LEVEMIR   Insulin Pen Needle 32G X 8 MM Misc     TAKE these medications   amLODipine 10 MG tablet Commonly known as: NORVASC Take 1 tablet (10 mg total) by mouth daily.   aspirin 81 MG EC tablet Take 1 tablet (81 mg total) by mouth daily.   atorvastatin 80 MG tablet Commonly known as: LIPITOR Take 1 tablet (80 mg total) by mouth daily at 6 PM.   blood glucose meter kit  and supplies Kit Dispense based on patient and insurance preference. Use up to four times daily as directed. (FOR ICD-9 250.00, 250.01).   clopidogrel 75 MG tablet Commonly known as: PLAVIX Take 1 tablet (75 mg total) by mouth daily for 21 days.   lisinopril 10 MG tablet Commonly known as: ZESTRIL Take 1 tablet (10 mg total) by mouth daily.   metFORMIN 500 MG tablet Commonly known as: GLUCOPHAGE Take 2 tablets (1,000 mg total) by mouth 2 (two) times daily with a meal.   tamsulosin 0.4 MG Caps capsule Commonly known as: FLOMAX Take 0.4 mg by mouth daily.      No Known Allergies    Procedures/Studies: Ct Head Wo Contrast  Result Date: 01/22/2019 CLINICAL DATA:  Speech difficulty, history of recent pontine infarct EXAM: CT HEAD WITHOUT  CONTRAST TECHNIQUE: Contiguous axial images were obtained from the base of the skull through the vertex without intravenous contrast. COMPARISON:  01/13/2019 FINDINGS: Brain: Faint hypodensity is noted in the left aspect of the pons consistent with the known prior infarct. Areas of chronic ischemia are again seen adjacent to the right lateral ventricle. No findings to suggest acute hemorrhage, acute infarction or space-occupying mass lesion are noted. Vascular: No hyperdense vessel or unexpected calcification. Skull: Normal. Negative for fracture or focal lesion. Sinuses/Orbits: No acute finding. Other: Scalp scarring is again seen and stable. IMPRESSION: Changes consistent with the recent left pontine infarct. Chronic ischemic changes are noted. No new focal abnormality is seen. Electronically Signed   By: Inez Catalina M.D.   On: 01/22/2019 12:52   Mr Angio Head Wo Contrast  Result Date: 01/22/2019 CLINICAL DATA:  Slurred speech beginning at 4 a.m. today. Right-sided weakness. Recent stroke. EXAM: MRI HEAD WITHOUT CONTRAST MRA HEAD WITHOUT CONTRAST MRA NECK WITHOUT CONTRAST TECHNIQUE: Multiplanar, multiecho pulse sequences of the brain and surrounding structures were obtained without intravenous contrast. Angiographic images of the Circle of Willis were obtained using MRA technique without intravenous contrast. Angiographic images of the neck were obtained using MRA technique without intravenous contrast. Carotid stenosis measurements (when applicable) are obtained utilizing NASCET criteria, using the distal internal carotid diameter as the denominator. COMPARISON:  MR head MRA head and MRA neck 01/14/2019. FINDINGS: MRI HEAD FINDINGS Brain: The left paramedian pontine infarct is slightly larger on today's study. Associated T2 signal changes are present. Right paramedian white matter changes are stable. No other acute infarct is present. Remote infarct involving the right lentiform nucleus and periventricular  white matter is again noted. Periventricular and subcortical T2 hyperintensities bilaterally are otherwise stable. No acute hemorrhage or mass lesion is present. Vascular: Flow is present in the major intracranial arteries. Skull and upper cervical spine: The craniocervical junction is normal. Upper cervical spine is within normal limits. Marrow signal is unremarkable. Sinuses/Orbits: Mild mucosal thickening is present throughout the ethmoid air cells, inferior maxillary sinuses, and sphenoid sinuses. The mastoid air cells are clear. Globes and orbits are within normal limits. 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 The internal carotid arteries are within normal limits scratched at the internal carotid arteries demonstrate some atherosclerotic change through the cavernous segments, stable the A1 segments are normal. The M1 segments are normal. MCA bifurcations are otherwise normal. The ACA and MCA branch vessels demonstrate some distal small vessel disease. The right vertebral artery is slightly dominant to the left. There is a high-grade stenosis at the origin of the right PICA, stable. Left AICA is dominant. The basilar  artery is within normal limits. Both posterior cerebral arteries originate from the basilar tip. There is mild proximal right P1 segment stenosis. A severe more distal right P2 segment stenosis present. There is a moderate distal left P2 segment stenosis. MRA NECK FINDINGS The time-of-flight images demonstrate no significant flow disturbance at either carotid bifurcation to suggest a significant stenosis. There is a common origin of the left common carotid artery and the innominate artery. Flow is antegrade in the vertebral arteries bilaterally. The right vertebral artery is dominant. No definite stenosis is present. IMPRESSION: 1. 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 2. 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  3. No new intracranial abnormality. 4. Remote right lentiform nucleus and periventricular white matter infarcts are stable. 5. 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 6. 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 7. Moderate distal left P2 segment stenosis. 8. No other significant proximal stenosis, aneurysm, or branch vessel occlusion within the Circle of Willis. Electronically Signed   By: San Morelle M.D.   On: 01/22/2019 17:16   Mr Angio Neck Wo Contrast  Result Date: 01/22/2019 CLINICAL DATA:  Slurred speech beginning at 4 a.m. today. Right-sided weakness. Recent stroke. EXAM: MRI HEAD WITHOUT CONTRAST MRA HEAD WITHOUT CONTRAST MRA NECK WITHOUT CONTRAST TECHNIQUE: Multiplanar, multiecho pulse sequences of the brain and surrounding structures were obtained without intravenous contrast. Angiographic images of the Circle of Willis were obtained using MRA technique without intravenous contrast. Angiographic images of the neck were obtained using MRA technique without intravenous contrast. Carotid stenosis measurements (when applicable) are obtained utilizing NASCET criteria, using the distal internal carotid diameter as the denominator. COMPARISON:  MR head MRA head and MRA neck 01/14/2019. FINDINGS: MRI HEAD FINDINGS Brain: The left paramedian pontine infarct is slightly larger on today's study. Associated T2 signal changes are present. Right paramedian white matter changes are stable. No other acute infarct is present. Remote infarct involving the right lentiform nucleus and periventricular white matter is again noted. Periventricular and subcortical T2 hyperintensities bilaterally are otherwise stable. No acute hemorrhage or mass lesion is present. Vascular: Flow is present in the major intracranial arteries. Skull and upper cervical spine: The craniocervical junction is normal. Upper cervical spine is within normal limits. Marrow signal is unremarkable.  Sinuses/Orbits: Mild mucosal thickening is present throughout the ethmoid air cells, inferior maxillary sinuses, and sphenoid sinuses. The mastoid air cells are clear. Globes and orbits are within normal limits. 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 The internal carotid arteries are within normal limits scratched at the internal carotid arteries demonstrate some atherosclerotic change through the cavernous segments, stable the A1 segments are normal. The M1 segments are normal. MCA bifurcations are otherwise normal. The ACA and MCA branch vessels demonstrate some distal small vessel disease. The right vertebral artery is slightly dominant to the left. There is a high-grade stenosis at the origin of the right PICA, stable. Left AICA is dominant. The basilar artery is within normal limits. Both posterior cerebral arteries originate from the basilar tip. There is mild proximal right P1 segment stenosis. A severe more distal right P2 segment stenosis present. There is a moderate distal left P2 segment stenosis. MRA NECK FINDINGS The time-of-flight images demonstrate no significant flow disturbance at either carotid bifurcation to suggest a significant stenosis. There is a common origin of the left common carotid artery and the innominate artery. Flow is antegrade in the  vertebral arteries bilaterally. The right vertebral artery is dominant. No definite stenosis is present. IMPRESSION: 1. 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 2. 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 3. No new intracranial abnormality. 4. Remote right lentiform nucleus and periventricular white matter infarcts are stable. 5. 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 6. 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 7. Moderate distal left P2 segment stenosis. 8. No other significant proximal stenosis, aneurysm, or branch vessel occlusion  within the Circle of Willis. Electronically Signed   By: San Morelle M.D.   On: 01/22/2019 17:16   Mr Brain Wo Contrast (neuro Protocol)  Result Date: 01/22/2019 CLINICAL DATA:  Slurred speech beginning at 4 a.m. today. Right-sided weakness. Recent stroke. EXAM: MRI HEAD WITHOUT CONTRAST MRA HEAD WITHOUT CONTRAST MRA NECK WITHOUT CONTRAST TECHNIQUE: Multiplanar, multiecho pulse sequences of the brain and surrounding structures were obtained without intravenous contrast. Angiographic images of the Circle of Willis were obtained using MRA technique without intravenous contrast. Angiographic images of the neck were obtained using MRA technique without intravenous contrast. Carotid stenosis measurements (when applicable) are obtained utilizing NASCET criteria, using the distal internal carotid diameter as the denominator. COMPARISON:  MR head MRA head and MRA neck 01/14/2019. FINDINGS: MRI HEAD FINDINGS Brain: The left paramedian pontine infarct is slightly larger on today's study. Associated T2 signal changes are present. Right paramedian white matter changes are stable. No other acute infarct is present. Remote infarct involving the right lentiform nucleus and periventricular white matter is again noted. Periventricular and subcortical T2 hyperintensities bilaterally are otherwise stable. No acute hemorrhage or mass lesion is present. Vascular: Flow is present in the major intracranial arteries. Skull and upper cervical spine: The craniocervical junction is normal. Upper cervical spine is within normal limits. Marrow signal is unremarkable. Sinuses/Orbits: Mild mucosal thickening is present throughout the ethmoid air cells, inferior maxillary sinuses, and sphenoid sinuses. The mastoid air cells are clear. Globes and orbits are within normal limits. 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 The internal carotid arteries are within normal limits scratched at the  internal carotid arteries demonstrate some atherosclerotic change through the cavernous segments, stable the A1 segments are normal. The M1 segments are normal. MCA bifurcations are otherwise normal. The ACA and MCA branch vessels demonstrate some distal small vessel disease. The right vertebral artery is slightly dominant to the left. There is a high-grade stenosis at the origin of the right PICA, stable. Left AICA is dominant. The basilar artery is within normal limits. Both posterior cerebral arteries originate from the basilar tip. There is mild proximal right P1 segment stenosis. A severe more distal right P2 segment stenosis present. There is a moderate distal left P2 segment stenosis. MRA NECK FINDINGS The time-of-flight images demonstrate no significant flow disturbance at either carotid bifurcation to suggest a significant stenosis. There is a common origin of the left common carotid artery and the innominate artery. Flow is antegrade in the vertebral arteries bilaterally. The right vertebral artery is dominant. No definite stenosis is present. IMPRESSION: 1. 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 2. 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 3. No new intracranial abnormality. 4. Remote right lentiform nucleus and periventricular white matter infarcts are stable. 5. 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 6. 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 7. Moderate distal left P2 segment stenosis. 8.  No other significant proximal stenosis, aneurysm, or branch vessel occlusion within the Circle of Willis. Electronically Signed   By: San Morelle M.D.   On: 01/22/2019 17:16     Subjective: Patient reports feeling much better.  Slurred speech has significantly improved but not yet to the level when he left the hospital the last time.  Has had difficulty expressing appropriate words at times of what he is thinking, since recent  discharge.  Denies asymmetric limb weakness, numbness.  No chest pain, dyspnea, dizziness, lightheadedness or palpitations reported.  No difficulty swallowing.  Discharge Exam:  Vitals:   01/22/19 1735 01/22/19 2124 01/23/19 0602 01/23/19 1519  BP: (!) 151/92 (!) 161/85 126/80 (!) 141/81  Pulse: 63 70 67 69  Resp: 15 17 20 18   Temp: 98.1 F (36.7 C) 98 F (36.7 C) 97.9 F (36.6 C) 98.1 F (36.7 C)  TempSrc: Oral  Oral Oral  SpO2: 100% 100% 98% 97%    General: Pleasant middle-age male, moderately built and nourished lying comfortably propped up in bed without distress. Cardiovascular: S1 & S2 heard, RRR, S1/S2 +. No murmurs, rubs, gallops or clicks. No JVD or pedal edema.  Telemetry personally reviewed: Sinus rhythm.  Couple of PVCs noted. Respiratory: Clear to auscultation without wheezing, rhonchi or crackles. No increased work of breathing. Abdominal:  Non distended, non tender & soft. No organomegaly or masses appreciated. Normal bowel sounds heard. CNS: Alert and oriented.  Mild dysarthria and facial asymmetry.  No other cranial nerve deficits appreciated. Extremities: no edema, no cyanosis.  Symmetric 5 x 5 power.  No pronator drift.    The results of significant diagnostics from this hospitalization (including imaging, microbiology, ancillary and laboratory) are listed below for reference.     Microbiology: Recent Results (from the past 240 hour(s))  SARS Coronavirus 2 by RT PCR (hospital order, performed in [place] hospital lab)     Status: None   Collection Time: 01/13/19 11:07 PM  Result Value Ref Range Status   SARS Coronavirus 2 NEGATIVE NEGATIVE Final    Comment: (NOTE) If result is NEGATIVE SARS-CoV-2 target nucleic acids are NOT DETECTED. The SARS-CoV-2 RNA is generally detectable in upper and lower  respiratory specimens during the acute phase of infection. The lowest  concentration of SARS-CoV-2 viral copies this assay can detect is 250  copies / mL. A  negative result does not preclude SARS-CoV-2 infection  and should not be used as the sole basis for treatment or other  patient management decisions.  A negative result may occur with  improper specimen collection / handling, submission of specimen other  than nasopharyngeal swab, presence of viral mutation(s) within the  areas targeted by this assay, and inadequate number of viral copies  (<250 copies / mL). A negative result must be combined with clinical  observations, patient history, and epidemiological information. If result is POSITIVE SARS-CoV-2 target nucleic acids are DETECTED. The SARS-CoV-2 RNA is generally detectable in upper and lower  respiratory specimens dur ing the acute phase of infection.  Positive  results are indicative of active infection with SARS-CoV-2.  Clinical  correlation with patient history and other diagnostic information is  necessary to determine patient infection status.  Positive results do  not rule out bacterial infection or co-infection with other viruses. If result is PRESUMPTIVE POSTIVE SARS-CoV-2 nucleic acids MAY BE PRESENT.   A presumptive positive result was obtained on the submitted specimen  and confirmed on repeat testing.  While 2019 novel coronavirus  (  SARS-CoV-2) nucleic acids may be present in the submitted sample  additional confirmatory testing may be necessary for epidemiological  and / or clinical management purposes  to differentiate between  SARS-CoV-2 and other Sarbecovirus currently known to infect humans.  If clinically indicated additional testing with an alternate test  methodology [phone]) is advised. The SARS-CoV-2 RNA is generally  detectable in upper and lower respiratory sp ecimens during the acute  phase of infection. The expected result is Negative. Fact Sheet for Patients:  StrictlyIdeas.no Fact Sheet for Healthcare Providers: BankingDealers.co.za This test is not  yet approved or cleared by the [place] FDA and has been authorized for detection and/or diagnosis of SARS-CoV-2 by FDA under an Emergency Use Authorization (EUA).  This EUA will remain in effect (meaning this test can be used) for the duration of the COVID-19 declaration under Section 564(b)(1) of the Act, 21 U.S.C. section 360bbb-3(b)(1), unless the authorization is terminated or revoked sooner. Performed at [facility], [place] [address], [place], [place] [address]   SARS CORONAVIRUS 2 (TAT 6-24 HRS) Nasopharyngeal Nasopharyngeal Swab     Status: None   Collection Time: 01/22/19  2:11 PM   Specimen: Nasopharyngeal Swab  Result Value Ref Range Status   SARS Coronavirus 2 NEGATIVE NEGATIVE Final    Comment: (NOTE) SARS-CoV-2 target nucleic acids are NOT DETECTED. The SARS-CoV-2 RNA is generally detectable in upper and lower respiratory specimens during the acute phase of infection. Negative results do not preclude SARS-CoV-2 infection, do not rule out co-infections with other pathogens, and should not be used as the sole basis for treatment or other patient management decisions. Negative results must be combined with clinical observations, patient history, and epidemiological information. The expected result is Negative. Fact Sheet for Patients: SugarRoll.be Fact Sheet for Healthcare Providers: https://www.woods-mathews.com/ This test is not yet approved or cleared by the [place] FDA and  has been authorized for detection and/or diagnosis of SARS-CoV-2 by FDA under an Emergency Use Authorization (EUA). This EUA will remain  in effect (meaning this test can be used) for the duration of the COVID-19 declaration under Section 56 4(b)(1) of the Act, 21 U.S.C. section 360bbb-3(b)(1), unless the authorization is terminated or revoked sooner. Performed at [facility], [place] [address], [place],  [place] [address]      Labs: CBC: Recent Labs  Lab 01/22/19 1106 01/22/19 1124  WBC 5.8  --   NEUTROABS 3.8  --   HGB 12.6* 12.9*  HCT 37.0* 38.0*  MCV 92.0  --   PLT 179  --    Basic Metabolic Panel: Recent Labs  Lab 01/22/19 1106 01/22/19 1124 01/23/19 0548  NA 135 138 136  K 3.5 3.6 3.5  CL 101 102 103  CO2 22  --  24  GLUCOSE 174* 168* 137*  BUN 23 21 18   CREATININE 1.27* 1.20 1.11  CALCIUM 9.0  --  9.1   CBG: Recent Labs  Lab 01/22/19 1102 01/22/19 2126 01/23/19 0749 01/23/19 1212 01/23/19 1613  GLUCAP 185* 222* 143* 237* 221*   Urinalysis    Component Value Date/Time   COLORURINE YELLOW 01/22/2019 1059   APPEARANCEUR CLEAR 01/22/2019 1059   LABSPEC 1.017 01/22/2019 1059   PHURINE 6.0 01/22/2019 1059   GLUCOSEU >=500 (A) 01/22/2019 1059   HGBUR NEGATIVE 01/22/2019 1059   BILIRUBINUR NEGATIVE 01/22/2019 1059   KETONESUR NEGATIVE 01/22/2019 1059   PROTEINUR 100 (A) 01/22/2019 1059   NITRITE NEGATIVE 01/22/2019 1059   LEUKOCYTESUR NEGATIVE  01/22/2019 1059    I offered to speak with his family to update care and answered questions but patient declined the offer and indicated that he will update them himself.  Time coordinating discharge: 35 minutes  SIGNED:  Vernell Leep, MD, FACP, [facility]  To contact the attending provider between 7A-7P or the covering provider during after hours 7P-7A, please log into the web site [email or web address] and access using universal [place] password for that web site. If you do not have the password, please call the hospital operator.

## 2019-01-23 NOTE — Evaluation (Signed)
Physical Therapy One TimeEvaluation Patient Details Name: Jerry Moran MRN: [id number] DOB: October 13, 1955 Today's Date: 01/23/2019   History of Present Illness  63 year old man admitted with slurred speech and unsteady gait.  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  MRI shows slight increase in L paramedian pontine infarct. PMH:  HTN and DM  Clinical Impression  Patient evaluated by Physical Therapy with no further acute PT needs identified. All education has been completed and the patient has no further questions.  Pt ambulated in hallway and reports R LE "giving" however no physical assist required.  Pt also denies any numbness, tingling, or weakness in LEs.  Recommended pt use SPC upon d/c for safety however he states he can obtain one if needed.  Pt eager to d/c home. See below for any follow-up Physical Therapy or equipment needs. PT is signing off. Thank you for this referral.     Follow Up Recommendations No PT follow up    Equipment Recommendations  Cane(cane for safety however pt reports he can acquire if needed)    Recommendations for Other Services       Precautions / Restrictions Precautions Precautions: Fall Restrictions Weight Bearing Restrictions: No      Mobility  Bed Mobility Overal bed mobility: Independent                Transfers Overall transfer level: Needs assistance Equipment used: None Transfers: Sit to/from Stand Sit to Stand: Min guard;Supervision         General transfer comment: for safety  Ambulation/Gait Ambulation/Gait assistance: Min guard;Supervision Gait Distance (Feet): 400 Feet Assistive device: None Gait Pattern/deviations: Step-through pattern;Decreased stride length     General Gait Details: occasional "give" of R LE however pt reports no weakness and no LOB as pt able to self correct  Stairs            Wheelchair Mobility    Modified Rankin (Stroke Patients Only)       Balance  Overall balance assessment: Needs assistance           Standing balance-Leahy Scale: Fair Standing balance comment: min guard for safety; unsteady (poor when walking)             High level balance activites: Backward walking;Turns;Sudden stops;Head turns High Level Balance Comments: pt performed above activities with min/guard for safety however no difficulty observed             Pertinent Vitals/Pain Pain Assessment: No/denies pain    Home Living Family/patient expects to be discharged to:: Private residence Living Arrangements: Spouse/significant other Available Help at Discharge: Available 24 hours/day Type of Home: House Home Access: Stairs to enter Entrance Stairs-Rails: None Entrance Stairs-Number of Steps: 1 Home Layout: Two level;Able to live on main level with bedroom/bathroom Home Equipment: None Additional Comments: pt has a ledge next to commode which he can push up from. No bars nor seat in shower    Prior Function Level of Independence: Independent         Comments: driving     Hand Dominance   Dominant Hand: Right    Extremity/Trunk Assessment   Upper Extremity Assessment Upper Extremity Assessment: Overall WFL for tasks assessed    Lower Extremity Assessment Lower Extremity Assessment: Overall WFL for tasks assessed(no difference between LEs in MMT in sitting)    Cervical / Trunk Assessment Cervical / Trunk Assessment: Normal  Communication   Communication: No difficulties  Cognition Arousal/Alertness: Awake/alert Behavior During Therapy:  WFL for tasks assessed/performed Overall Cognitive Status: Within Functional Limits for tasks assessed                                        General Comments      Exercises     Assessment/Plan    PT Assessment Patent does not need any further PT services  PT Problem List         PT Treatment Interventions      PT Goals (Current goals can be found in the Care Plan  section)  Acute Rehab PT Goals PT Goal Formulation: All assessment and education complete, DC therapy    Frequency     Barriers to discharge        Co-evaluation               AM-PAC PT "6 Clicks" Mobility  Outcome Measure Help needed turning from your back to your side while in a flat bed without using bedrails?: None Help needed moving from lying on your back to sitting on the side of a flat bed without using bedrails?: None Help needed moving to and from a bed to a chair (including a wheelchair)?: None Help needed standing up from a chair using your arms (e.g., wheelchair or bedside chair)?: A Little Help needed to walk in hospital room?: A Little Help needed climbing 3-5 steps with a railing? : A Little 6 Click Score: 21    End of Session Equipment Utilized During Treatment: Gait belt Activity Tolerance: Patient tolerated treatment well Patient left: in bed;with call bell/phone within reach;with bed alarm set Nurse Communication: Mobility status PT Visit Diagnosis: Other abnormalities of gait and mobility (R26.89)    Time: 1400-1410 PT Time Calculation (min) (ACUTE ONLY): 10 min   Charges:   PT Evaluation $PT Eval Low Complexity: [place], PT, DPT Acute Rehabilitation Services Office: [phone] Pager: [phone]  Trena Platt 01/23/2019, 3:01 PM

## 2019-03-11 ENCOUNTER — Inpatient Hospital Stay: Payer: Self-pay | Admitting: Adult Health

## 2019-05-09 ENCOUNTER — Encounter (HOSPITAL_COMMUNITY): Payer: Self-pay

## 2019-05-09 ENCOUNTER — Emergency Department (HOSPITAL_BASED_OUTPATIENT_CLINIC_OR_DEPARTMENT_OTHER): Payer: No Typology Code available for payment source

## 2019-05-09 ENCOUNTER — Emergency Department (HOSPITAL_COMMUNITY)
Admission: EM | Admit: 2019-05-09 | Discharge: 2019-05-09 | Disposition: A | Discharge status: Home / Self Care | Payer: No Typology Code available for payment source | Attending: Emergency Medicine | Admitting: Emergency Medicine

## 2019-05-09 ENCOUNTER — Other Ambulatory Visit: Payer: Self-pay

## 2019-05-09 DIAGNOSIS — R197 Diarrhea, unspecified: Secondary | ICD-10-CM | POA: Diagnosis not present

## 2019-05-09 DIAGNOSIS — M79604 Pain in right leg: Secondary | ICD-10-CM | POA: Insufficient documentation

## 2019-05-09 DIAGNOSIS — Z7984 Long term (current) use of oral hypoglycemic drugs: Secondary | ICD-10-CM | POA: Diagnosis not present

## 2019-05-09 DIAGNOSIS — E119 Type 2 diabetes mellitus without complications: Secondary | ICD-10-CM | POA: Insufficient documentation

## 2019-05-09 DIAGNOSIS — Z79899 Other long term (current) drug therapy: Secondary | ICD-10-CM | POA: Insufficient documentation

## 2019-05-09 DIAGNOSIS — I10 Essential (primary) hypertension: Secondary | ICD-10-CM | POA: Diagnosis not present

## 2019-05-09 DIAGNOSIS — R112 Nausea with vomiting, unspecified: Secondary | ICD-10-CM

## 2019-05-09 DIAGNOSIS — R52 Pain, unspecified: Secondary | ICD-10-CM

## 2019-05-09 DIAGNOSIS — Z8673 Personal history of transient ischemic attack (TIA), and cerebral infarction without residual deficits: Secondary | ICD-10-CM | POA: Insufficient documentation

## 2019-05-09 DIAGNOSIS — Z7982 Long term (current) use of aspirin: Secondary | ICD-10-CM | POA: Diagnosis not present

## 2019-05-09 LAB — COMPREHENSIVE METABOLIC PANEL
ALT: 21 U/L (ref 0–44)
AST: 20 U/L (ref 15–41)
Albumin: 4.3 g/dL (ref 3.5–5.0)
Alkaline Phosphatase: 62 U/L (ref 38–126)
Anion gap: 9 (ref 5–15)
BUN: 13 mg/dL (ref 8–23)
CO2: 26 mmol/L (ref 22–32)
Calcium: 9.6 mg/dL (ref 8.9–10.3)
Chloride: 104 mmol/L (ref 98–111)
Creatinine, Ser: 1.14 mg/dL (ref 0.61–1.24)
GFR calc Af Amer: >60 mL/min (ref >60)
GFR calc non Af Amer: >60 mL/min (ref >60)
Glucose, Bld: 100 mg/dL — ABNORMAL HIGH (ref 70–99)
Potassium: 3.9 mmol/L (ref 3.5–5.1)
Sodium: 139 mmol/L (ref 135–145)
Total Bilirubin: 1.1 mg/dL (ref 0.3–1.2)
Total Protein: 8.1 g/dL (ref 6.5–8.1)

## 2019-05-09 LAB — URINALYSIS, ROUTINE W REFLEX MICROSCOPIC
Bacteria, UA: NONE SEEN
Bilirubin Urine: NEGATIVE
Glucose, UA: NEGATIVE mg/dL
Hgb urine dipstick: NEGATIVE
Ketones, ur: NEGATIVE mg/dL
Leukocytes,Ua: NEGATIVE
Nitrite: NEGATIVE
Protein, ur: 100 mg/dL — AB
Specific Gravity, Urine: 1.015 (ref 1.005–1.030)
pH: 6 (ref 5.0–8.0)

## 2019-05-09 LAB — CBC WITH DIFFERENTIAL/PLATELET
Abs Immature Granulocytes: 0.03 10*3/uL (ref 0.00–0.07)
Basophils Absolute: 0 10*3/uL (ref 0.0–0.1)
Basophils Relative: 0 %
Eosinophils Absolute: 0.1 10*3/uL (ref 0.0–0.5)
Eosinophils Relative: 1 %
HCT: 38.6 % — ABNORMAL LOW (ref 39.0–52.0)
Hemoglobin: 13.1 g/dL (ref 13.0–17.0)
Immature Granulocytes: 0 %
Lymphocytes Relative: 10 %
Lymphs Abs: 0.7 10*3/uL (ref 0.7–4.0)
MCH: 31.2 pg (ref 26.0–34.0)
MCHC: 33.9 g/dL (ref 30.0–36.0)
MCV: 91.9 fL (ref 80.0–100.0)
Monocytes Absolute: 0.5 10*3/uL (ref 0.1–1.0)
Monocytes Relative: 7 %
Neutro Abs: 6.1 10*3/uL (ref 1.7–7.7)
Neutrophils Relative %: 82 %
Platelets: 200 10*3/uL (ref 150–400)
RBC: 4.2 MIL/uL — ABNORMAL LOW (ref 4.22–5.81)
RDW: 12.8 % (ref 11.5–15.5)
WBC: 7.4 10*3/uL (ref 4.0–10.5)
nRBC: 0 % (ref 0.0–0.2)

## 2019-05-09 LAB — LIPASE, BLOOD: Lipase: 40 U/L (ref 11–51)

## 2019-05-09 MED ORDER — ONDANSETRON HCL 4 MG/2ML IJ SOLN
4.0000 mg | Freq: Once | INTRAMUSCULAR | Status: AC
  Administered 2019-05-09: 4 mg via INTRAVENOUS
  Filled 2019-05-09: qty 2

## 2019-05-09 MED ORDER — SODIUM CHLORIDE 0.9 % IV BOLUS
1000.0000 mL | Freq: Once | INTRAVENOUS | Status: AC
  Administered 2019-05-09: 11:00:00 1000 mL via INTRAVENOUS

## 2019-05-09 NOTE — Discharge Instructions (Signed)
Your history and exam today are consistent with nausea vomiting and diarrhea related to the food you ate last night.  There also could be a viral cause however given your otherwise resolution of symptoms and reassuring lab work and we feel you are safe for discharge home.  The ultrasound of your leg showed no evidence of blood clot.  This is likely your chronic pain continuing to give you problems.  [facility] and please rest and stay hydrated.  If any symptoms change or worsen, please return to the nearest emergency department.

## 2019-05-09 NOTE — ED Triage Notes (Signed)
Pt c/o fatigue, nausea and 2 bouts of vomiting since 0400. and chronic left leg pain since stroke.

## 2019-05-09 NOTE — ED Provider Notes (Signed)
[facility] DEPT Provider Note   CSN: [id number] Arrival date & time: 05/09/19  8469     History Chief Complaint  Patient presents with   Nausea   Emesis   Fatigue   Leg Pain    Jerry Moran is a 64 y.o. male.  The history is provided by medical records and the patient. No language interpreter was used.  Emesis Severity:  Severe Duration:  1 day Timing:  Constant Quality:  Stomach contents Progression:  Unchanged Chronicity:  New Recent urination:  Normal Relieved by:  Nothing Worsened by:  Nothing Ineffective treatments:  None tried Associated symptoms: diarrhea   Associated symptoms: no abdominal pain, no chills, no cough, no fever, no headaches, no sore throat and no URI        Past Medical History:  Diagnosis Date   Diabetes mellitus without complication ([place])    Hypertension    Stroke [facility])     Patient Active Problem List   Diagnosis Date Noted   CVA (cerebral vascular accident) ([place]) 01/22/2019   Type 2 diabetes mellitus with hyperlipidemia ([place]) 01/22/2019   Essential hypertension 01/22/2019   Elevated serum creatinine 01/22/2019   Stroke-like symptoms    Slurred speech 01/14/2019   TIA (transient ischemic attack) 01/14/2019    No past surgical history on file.     No family history on file.  Social History   Tobacco Use   Smoking status: Never Smoker   Smokeless tobacco: Never Used  Substance Use Topics   Alcohol use: Never   Drug use: Never    Home Medications Prior to Admission medications   Medication Sig Start Date End Date Taking? Authorizing Provider  amLODipine (NORVASC) 10 MG tablet Take 1 tablet (10 mg total) by mouth daily. 01/16/19   Kayleen Memos, DO  aspirin EC 81 MG EC tablet Take 1 tablet (81 mg total) by mouth daily. 01/17/19 05/17/19  Kayleen Memos, DO  atorvastatin (LIPITOR) 80 MG tablet Take 1 tablet (80 mg total) by mouth daily at 6 PM. 01/16/19 02/15/19  Kayleen Memos, DO  blood glucose meter kit and supplies KIT Dispense based on patient and insurance preference. Use up to four times daily as directed. (FOR ICD-9 250.00, 250.01). 01/16/19   Kayleen Memos, DO  lisinopril (ZESTRIL) 10 MG tablet Take 1 tablet (10 mg total) by mouth daily. 01/16/19   Kayleen Memos, DO  metFORMIN (GLUCOPHAGE) 500 MG tablet Take 2 tablets (1,000 mg total) by mouth 2 (two) times daily with a meal. 01/16/19 02/15/19  Kayleen Memos, DO  tamsulosin (FLOMAX) 0.4 MG CAPS capsule Take 0.4 mg by mouth daily.    [provider]    Allergies    Patient has no known allergies.  Review of Systems   Review of Systems  Constitutional: Positive for fatigue. Negative for chills, diaphoresis and fever.  HENT: Negative for congestion, rhinorrhea and sore throat.   Eyes: Negative for visual disturbance.  Respiratory: Negative for cough, chest tightness, shortness of breath, wheezing and stridor.   Cardiovascular: Negative for chest pain and palpitations.  Gastrointestinal: Positive for diarrhea, nausea and vomiting. Negative for abdominal pain and constipation.  Genitourinary: Negative for decreased urine volume, dysuria, flank pain and frequency.  Musculoskeletal: Negative for back pain, neck pain and neck stiffness.  Skin: Negative for rash and wound.  Neurological: Positive for light-headedness. Negative for syncope, weakness, numbness and headaches.  Psychiatric/Behavioral: Negative for agitation and confusion.  All other  systems reviewed and are negative.   Physical Exam Updated Vital Signs BP (!) 158/85    Pulse 90    Temp 98.2 F (36.8 C) (Oral)    Resp 16    SpO2 100%   Physical Exam Vitals and nursing note reviewed.  Constitutional:      General: He is not in acute distress.    Appearance: He is well-developed. He is not ill-appearing, toxic-appearing or diaphoretic.  HENT:     Head: Normocephalic and atraumatic.     Nose: Nose normal. No congestion or  rhinorrhea.     Mouth/Throat:     Mouth: Mucous membranes are moist.     Pharynx: No oropharyngeal exudate or posterior oropharyngeal erythema.  Eyes:     Conjunctiva/sclera: Conjunctivae normal.     Pupils: Pupils are equal, round, and reactive to light.  Cardiovascular:     Rate and Rhythm: Normal rate and regular rhythm.     Pulses: Normal pulses.     Heart sounds: No murmur.  Pulmonary:     Effort: Pulmonary effort is normal. No respiratory distress.     Breath sounds: Normal breath sounds. No wheezing, rhonchi or rales.  Chest:     Chest wall: No tenderness.  Abdominal:     General: Abdomen is flat. There is no distension.     Palpations: Abdomen is soft.     Tenderness: There is no abdominal tenderness. There is no right CVA tenderness, left CVA tenderness, guarding or rebound.  Musculoskeletal:        General: No tenderness.     Cervical back: Neck supple. No tenderness.       Legs:     Comments: Chronic tenderness of the right hip and right shin.  Normal sensation and pulse in legs.  Mild weakness of the right leg which he reports is unchanged from baseline.  Skin:    General: Skin is warm and dry.     Capillary Refill: Capillary refill takes less than 2 seconds.     Findings: No erythema.  Neurological:     General: No focal deficit present.     Mental Status: He is alert and oriented to person, place, and time. Mental status is at baseline.     Sensory: No sensory deficit.     Motor: Weakness present.  Psychiatric:        Mood and Affect: Mood normal.     ED Results / Procedures / Treatments   Labs (all labs ordered are listed, but only abnormal results are displayed) Labs Reviewed  CBC WITH DIFFERENTIAL/PLATELET - Abnormal; Notable for the following components:      Result Value   RBC 4.20 (*)    HCT 38.6 (*)    All other components within normal limits  COMPREHENSIVE METABOLIC PANEL - Abnormal; Notable for the following components:   Glucose, Bld 100 (*)     All other components within normal limits  URINALYSIS, ROUTINE W REFLEX MICROSCOPIC - Abnormal; Notable for the following components:   Protein, ur 100 (*)    All other components within normal limits  URINE CULTURE  LIPASE, BLOOD    EKG None  Radiology VAS [place] LOWER EXTREMITY VENOUS (DVT) (ONLY [facility] & [facility])  Result Date: 05/09/2019  Lower Venous DVTStudy Indications: Pain.  Comparison Study: No prior study Performing Technologist: Maudry Mayhew MHA, RDMS, RVT, RDCS  Examination Guidelines: A complete evaluation includes B-mode imaging, spectral Doppler, color Doppler, and power Doppler as needed of all accessible  portions of each vessel. Bilateral testing is considered an integral part of a complete examination. Limited examinations for reoccurring indications may be performed as noted. The reflux portion of the exam is performed with the patient in reverse Trendelenburg.  +---------+---------------+---------+-----------+----------+--------------+  RIGHT     Compressibility Phasicity Spontaneity Properties Thrombus Aging  +---------+---------------+---------+-----------+----------+--------------+  CFV       Full            Yes       Yes                                    +---------+---------------+---------+-----------+----------+--------------+  SFJ       Full                                                             +---------+---------------+---------+-----------+----------+--------------+  FV Prox   Full                                                             +---------+---------------+---------+-----------+----------+--------------+  FV Mid    Full                                                             +---------+---------------+---------+-----------+----------+--------------+  FV Distal Full                                                             +---------+---------------+---------+-----------+----------+--------------+  PFV       Full                                                              +---------+---------------+---------+-----------+----------+--------------+  POP       Full            Yes       Yes                                    +---------+---------------+---------+-----------+----------+--------------+  PTV       Full                                                             +---------+---------------+---------+-----------+----------+--------------+  PERO  Full                                                             +---------+---------------+---------+-----------+----------+--------------+     Summary: RIGHT: - There is no evidence of deep vein thrombosis in the lower extremity.  - No cystic structure found in the popliteal fossa.   *See table(s) above for measurements and observations.    Preliminary     Procedures Procedures (including critical care time)  Medications Ordered in ED Medications  ondansetron (ZOFRAN) injection 4 mg (4 mg Intravenous Given 05/09/19 1108)  sodium chloride 0.9 % bolus 1,000 mL (0 mLs Intravenous Stopped 05/09/19 1208)    ED Course  I have reviewed the triage vital signs and the nursing notes.  Pertinent labs & imaging results that were available during my care of the patient were reviewed by me and considered in my medical decision making (see chart for details).    MDM Rules/Calculators/A&P                      Jerry Moran is a 64 y.o. male with a past medical history significant for diabetes, hypertension, prior stroke, and prior right leg injury who presents with chronic leg leg pain, and new nausea, vomiting, diarrhea, fatigue, and lightheadedness.  Patient reports that he ate [place] food last night and this morning woke up having several episodes of nausea, vomiting, diarrhea.  He denies any abdominal pain or chest pain but does report feeling lightheaded and fatigue.  He thinks it could have been a [place] food.  He reports he chronically has right leg pain and this is not worse than his baseline.  He  reports he had a stroke back in October and then had a fall in December and was worked up at the [place] for right leg injury.  He reports his work-up was negative and he has chronic pain which he takes Tylenol for.  He reports he has not taken any increase amount of Tylenol than his baseline.  He reports several episodes of nausea and vomiting that was nonbilious and nonbloody.  He denies any blood in his diarrhea.  He reports he is feeling lightheaded and fatigue and thinks he could be slightly dehydrated.  He denies any Covid symptoms.  Denies any URI symptoms of rhinorrhea, junction, cough, chest pain, or palpitations.  He denies any Covid contacts.  On exam, lungs are clear and chest is nontender.  Abdomen is nontender.  Normal bowel sounds.  Legs have normal sensation and he has some mild right leg weakness compared to left.  He has good pulses in lower extremities.  Mild tenderness in his right shin and his right hip which he reports is always there.  He denies any tenderness on his back.  Patient's vital signs show some hypertension but otherwise vital signs reassuring.  Clinically I suspect patient has some GI symptoms related to [place] food last night.  Patient will be given fluids and nausea medicine.  He also have screening labs checked to look for significant electrolyte imbalance.  Due to his lack of new injury or change in pain, we agreed to hold on new imaging of his leg.  Due to lack of any neurologic deficits we agreed to not do any imaging for  his head at this time.  Anticipate reassessment after labs and rehydration and nausea medication.  Suspect he will be stable for discharge home.  3:31 PM Patient felt better in regards to the nausea and vomiting after fluids and medication.  He had reassuring labs on blood work.  As I was getting ready to discharge patient, he reported that he wanted to rule out a blood clot in his right leg.  Initially, he told me that his right leg pain is at his  baseline and no worse however on reassessment he reports that it is somewhat worse than normal for him and he was told to make sure there is not a blood clot.  He agreed to get the ultrasound and it was ordered.  Anticipate discharge if ultrasound is negative.  Ultrasound was negative for DVT.  Patient was feeling much better.  Patient did not want nausea medicine and thinks this was from [place].  He feels safe going home.  Patient discharged in good condition to [facility] and [facility].  Final Clinical Impression(s) / ED Diagnoses Final diagnoses:  Nausea vomiting and diarrhea  Right leg pain    Rx / DC Orders ED Discharge Orders    None     Clinical Impression: 1. Nausea vomiting and diarrhea   2. Right leg pain     Disposition: Discharge  Condition: Good  I have discussed the results, Dx and Tx plan with the pt(& family if present). He/she/they expressed understanding and agree(s) with the plan. Discharge instructions discussed at great length. Strict return precautions discussed and pt &/or family have verbalized understanding of the instructions. No further questions at time of discharge.    Discharge Medication List as of 05/09/2019  4:39 PM      Follow Up: Clinic, [facility] [place] [address] [phone]        Lamaj Metoyer, Gwenyth Allegra, MD 05/09/19 [phone]

## 2019-05-09 NOTE — Progress Notes (Signed)
Right lower extremity venous duplex completed. Refer to "CV Proc" under chart review to view preliminary results.  05/09/2019 4:16 PM Kelby Aline., MHA, RVT, RDCS, RDMS

## 2019-05-10 LAB — URINE CULTURE: Culture: NO GROWTH

## 2019-08-18 ENCOUNTER — Other Ambulatory Visit: Payer: Self-pay | Admitting: Family Medicine

## 2019-08-18 DIAGNOSIS — M25551 Pain in right hip: Secondary | ICD-10-CM

## 2019-08-18 DIAGNOSIS — M79671 Pain in right foot: Secondary | ICD-10-CM

## 2019-09-19 ENCOUNTER — Ambulatory Visit
Admission: RE | Admit: 2019-09-19 | Discharge: 2019-09-19 | Disposition: A | Discharge status: Home / Self Care | Payer: No Typology Code available for payment source | Source: Ambulatory Visit | Attending: Family Medicine | Admitting: Family Medicine

## 2019-09-19 ENCOUNTER — Other Ambulatory Visit: Payer: Self-pay | Admitting: Family Medicine

## 2019-09-19 ENCOUNTER — Other Ambulatory Visit: Payer: Self-pay

## 2019-09-19 DIAGNOSIS — M25551 Pain in right hip: Secondary | ICD-10-CM

## 2019-09-19 DIAGNOSIS — M79671 Pain in right foot: Secondary | ICD-10-CM

## 2019-09-19 IMAGING — MR MR FOOT*R* W/O CM
6 series · 40 of 40 positions shown · non-contrast
Comparison: None.

CLINICAL DATA: Right hindfoot pain since a fall on [DATE].

EXAM:
MRI OF THE RIGHT HINDFOOT WITHOUT CONTRAST
TECHNIQUE: Multiplanar, multisequence MR imaging of the right hindfoot was
performed. No intravenous contrast was administered. The patient
refused intravenous contrast because of pain.

[Series 6: T1 · sagittal · 4.0mm · 0.56mm/px · 5 of 22 slices shown]
[im 1/22]
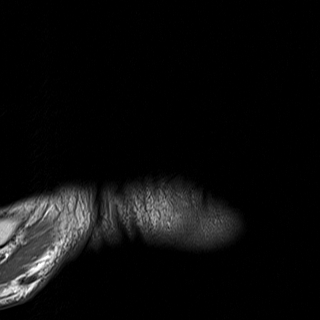
[im 6/22]
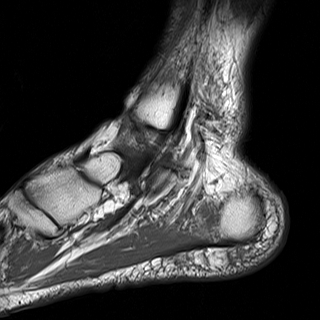
[im 11/22]
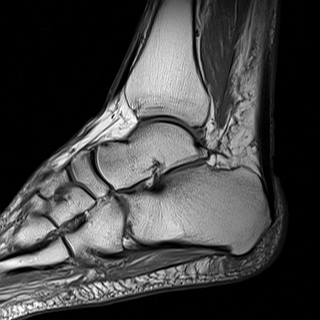
[im 16/22]
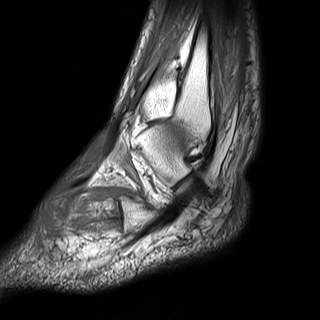
[im 22/22]
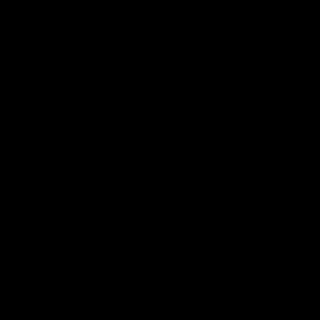

[Series 7: STIR · sagittal · 4.0mm · 0.70mm/px · 5 of 22 slices shown]
[im 1/22]
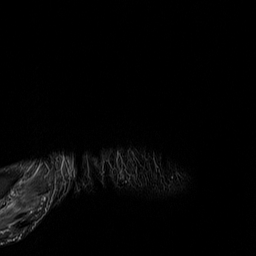
[im 6/22]
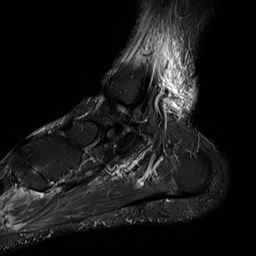
[im 11/22]
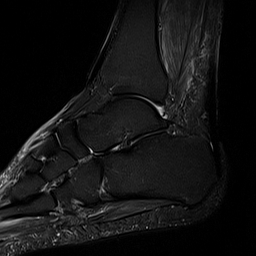
[im 16/22]
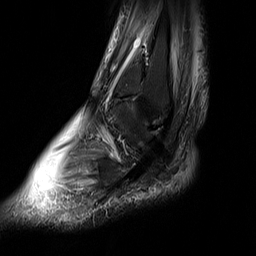
[im 22/22]
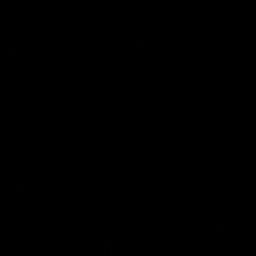

[Series 8: T1 fat-sat · axial · 3.0mm · 0.70mm/px · z∈[-78,+57]mm · 7 of 36 slices shown]
[im 1/36]
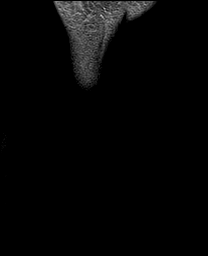
[im 6/36]
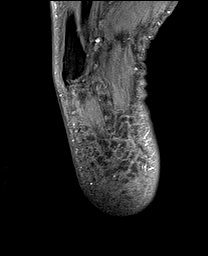
[im 12/36]
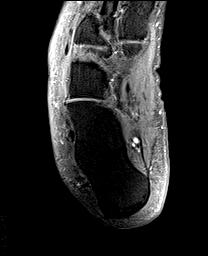
[im 18/36]
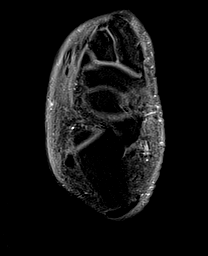
[im 24/36]
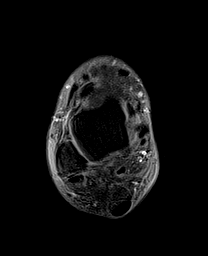
[im 30/36]
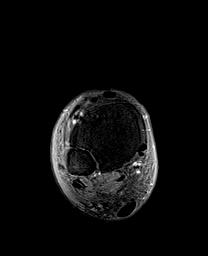
[im 36/36]
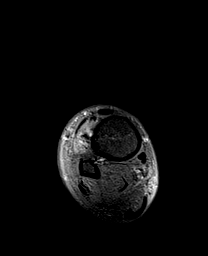

[Series 9: T2 fat-sat · axial · 3.0mm · 0.53mm/px · z∈[-80,+63]mm · 8 of 38 slices shown (1 of 2)]
[im 1/38]
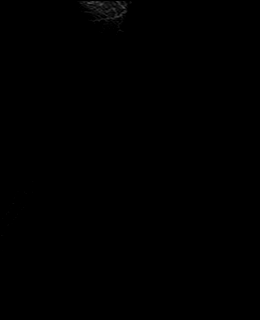
[im 6/38]
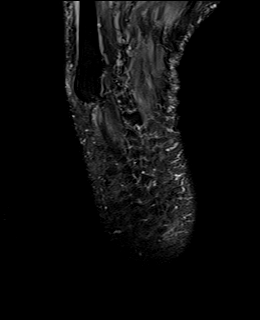
[im 11/38]
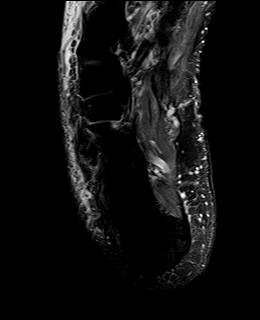
[im 16/38]
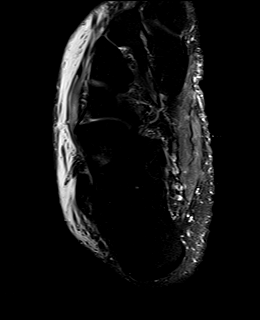
[im 22/38]
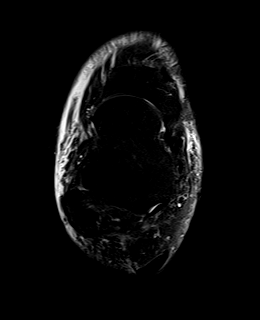
[im 27/38]
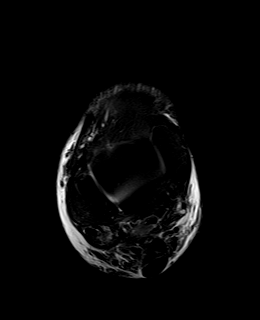
[im 32/38]
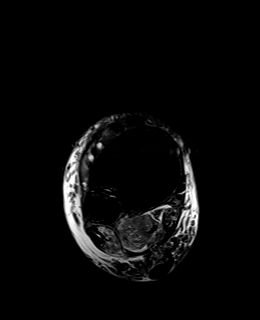
[im 38/38]
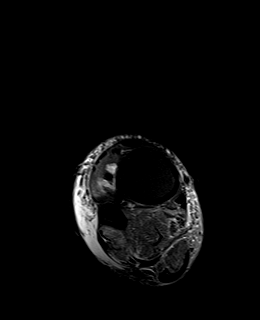

[Series 10: PD fat-sat · axial · 3.5mm · 0.56mm/px · z∈[-77,+67]mm · 7 of 35 slices shown]
[im 1/35]
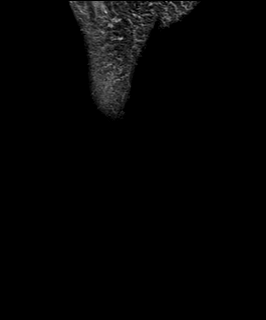
[im 6/35]
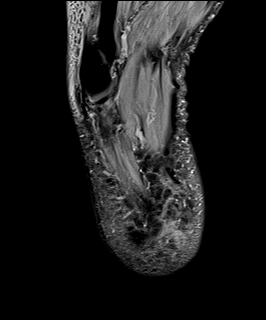
[im 12/35]
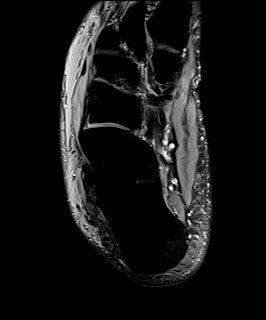
[im 18/35]
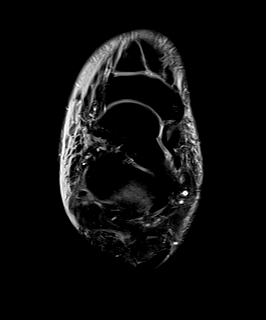
[im 23/35]
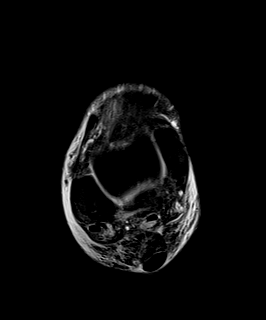
[im 29/35]
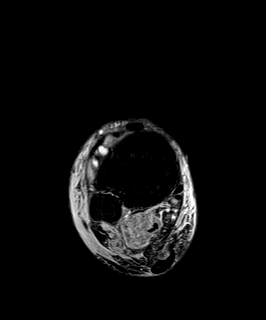
[im 35/35]
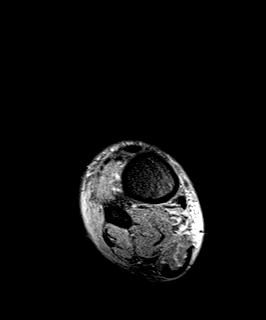

[Series 11: T2 fat-sat · coronal · 3.0mm · 0.70mm/px · 8 of 41 slices shown (2 of 2)]
[im 1/41]
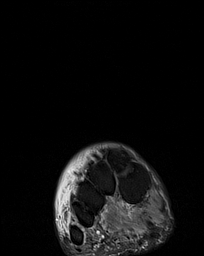
[im 6/41]
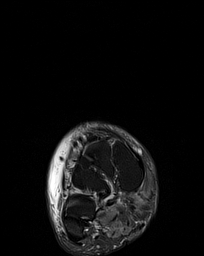
[im 12/41]
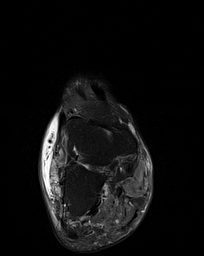
[im 18/41]
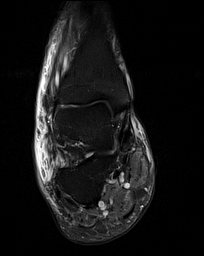
[im 23/41]
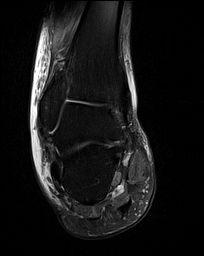
[im 29/41]
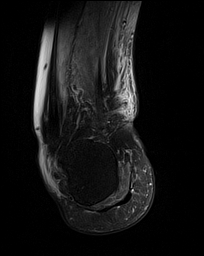
[im 35/41]
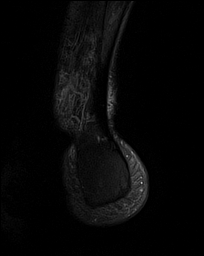
[im 41/41]
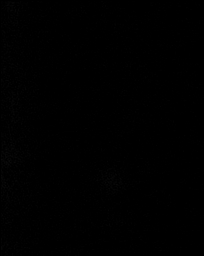

[40 of 40 positions shown; findings below may reference images not displayed]

FINDINGS: TENDONS

Peroneal: Normal.

Posteromedial: Normal.

Anterior: Normal.

Achilles: Normal.

Plantar Fascia: Normal.

LIGAMENTS

Lateral: Normal.

Medial: Normal.

CARTILAGE

Ankle Joint: No joint effusion or chondral defect.

Subtalar Joints/Sinus Tarsi: No joint effusion or chondral defect.

Bones: Slight dorsal spurring at the talonavicular joint. Otherwise
normal.

Other: Nonspecific subcutaneous edema circumferentially around the
ankle extending onto the dorsal lateral aspect of the foot.
IMPRESSION: 1. Nonspecific subcutaneous edema circumferentially around the ankle
extending onto the dorsal lateral aspect of the foot.
2. Otherwise, normal exam.

## 2019-09-19 IMAGING — MR MR HIP*R* W/O CM
5 of 6 series · 30 of 40 positions shown · non-contrast
Comparison: None.

CLINICAL DATA: Right hip pain since a fall on [DATE].

EXAM:
MR OF THE RIGHT HIP WITHOUT CONTRAST
TECHNIQUE: Multiplanar, multisequence MR imaging was performed. No intravenous
contrast was administered.

[Series 4: T1 · coronal · 4.0mm · 0.74mm/px · 3 of 18 slices shown]
[im 1/18]
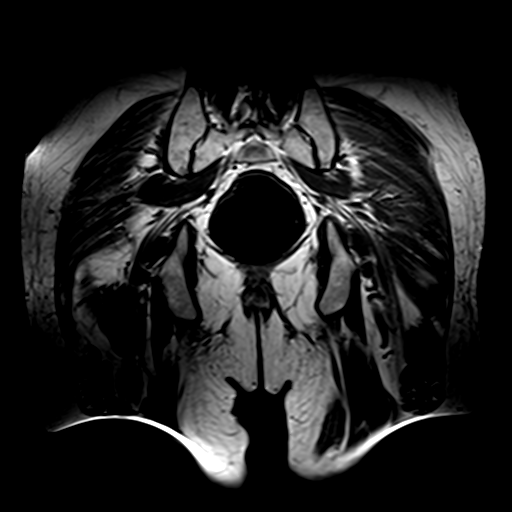
[im 4/18]
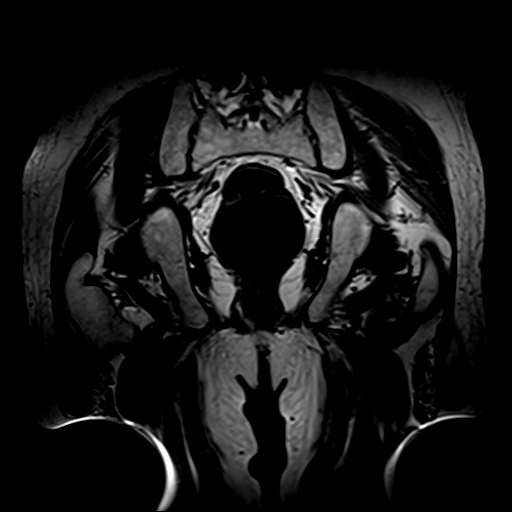
[im 7/18]
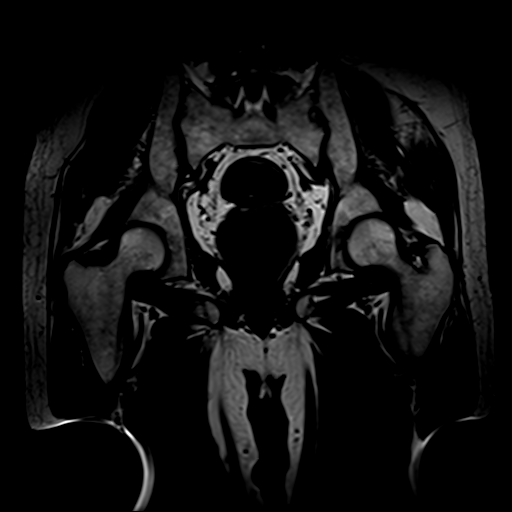

[Series 5: T2 fat-sat · coronal · 4.0mm · 0.74mm/px · 7 of 24 slices shown (1 of 2)]
[im 1/24]
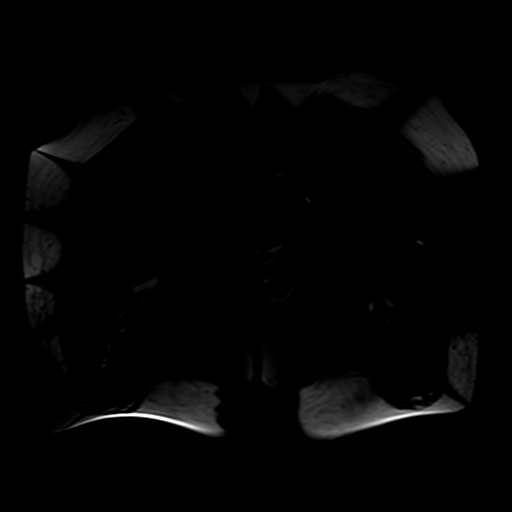
[im 4/24]
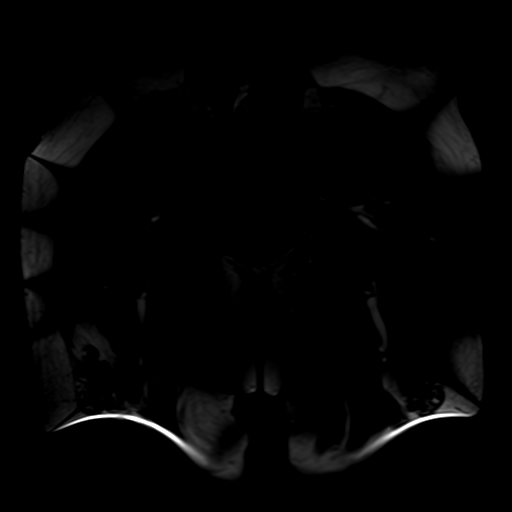
[im 8/24]
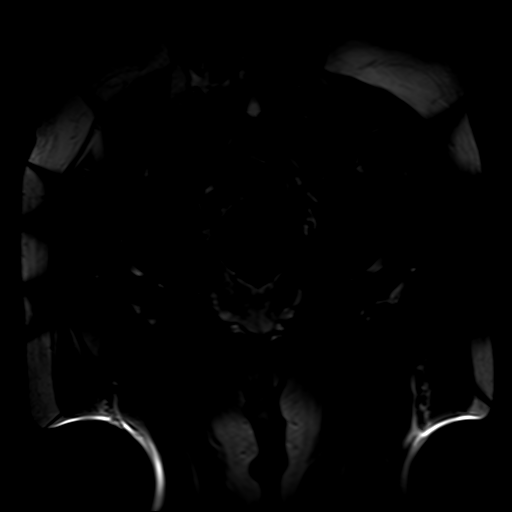
[im 12/24]
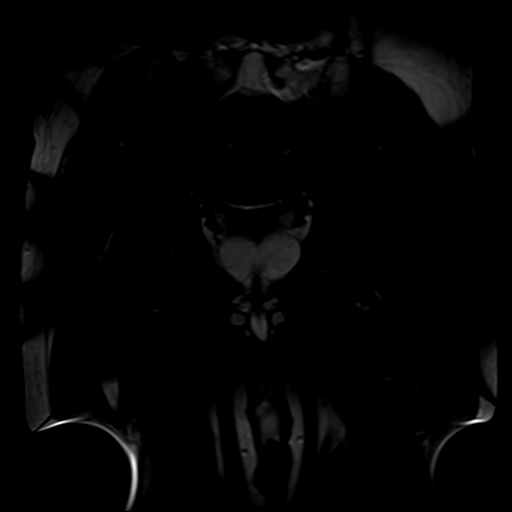
[im 16/24]
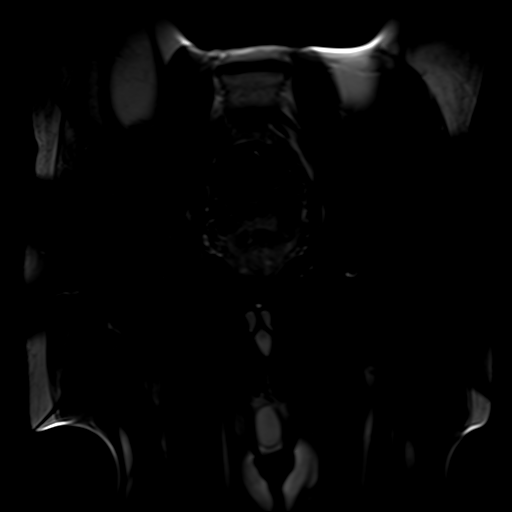
[im 20/24]
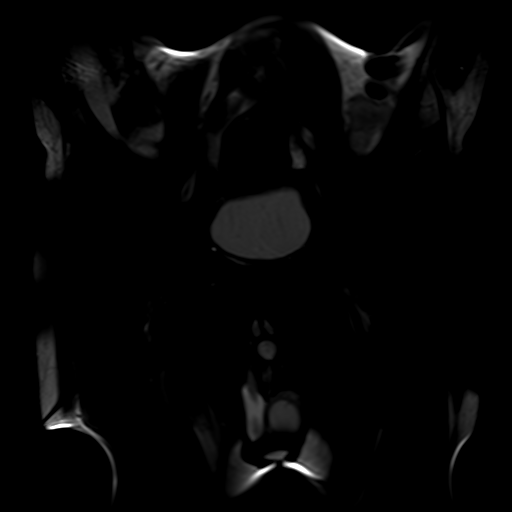
[im 24/24]
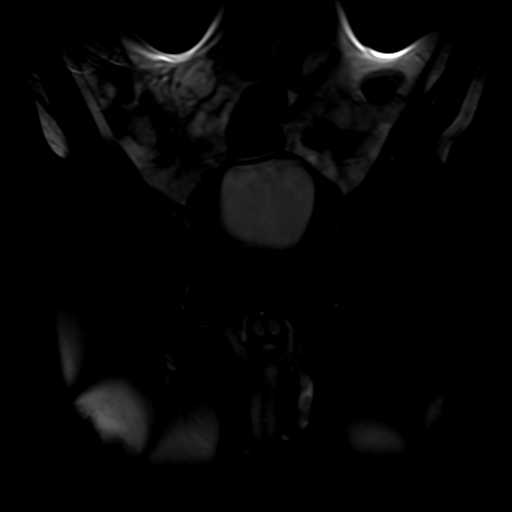

[Series 6: PD fat-sat · sagittal · 4.0mm · 0.70mm/px · 7 of 22 slices shown (1 of 2)]
[im 1/22]
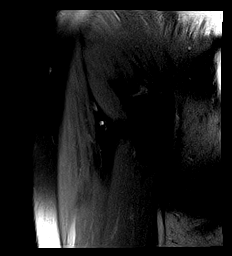
[im 4/22]
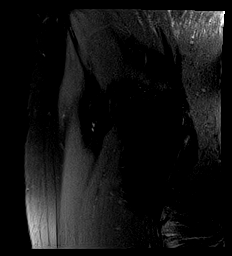
[im 8/22]
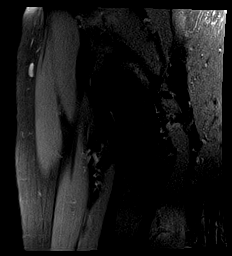
[im 11/22]
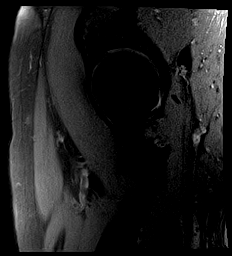
[im 15/22]
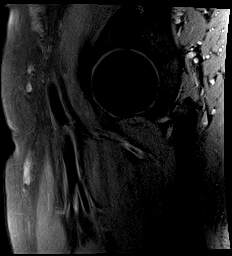
[im 18/22]
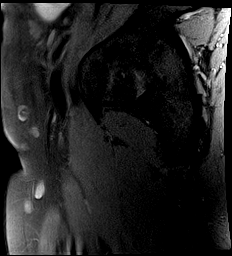
[im 22/22]
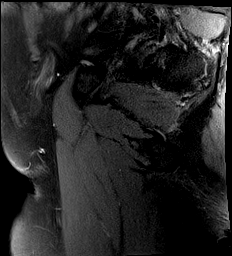

[Series 7: PD fat-sat · coronal · 4.0mm · 0.70mm/px · 6 of 19 slices shown (2 of 2)]
[im 1/19]
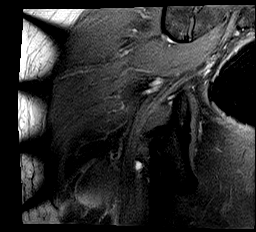
[im 4/19]
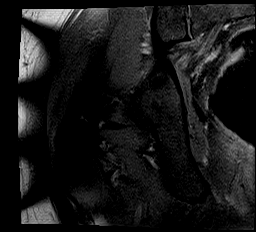
[im 8/19]
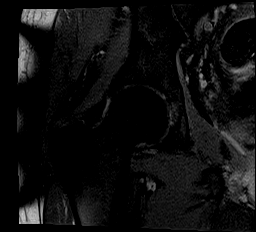
[im 11/19]
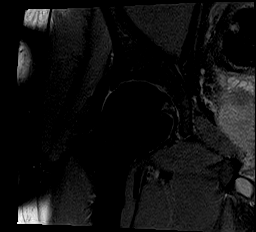
[im 15/19]
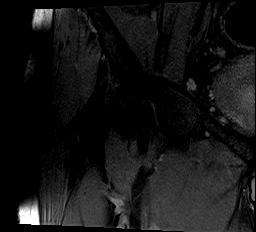
[im 19/19]
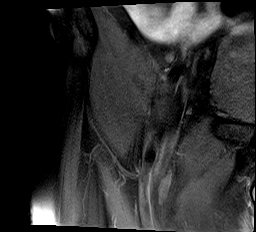

[Series 8: T2 fat-sat · axial · 4.0mm · 0.35mm/px · z∈[-87,+28]mm · 7 of 24 slices shown (2 of 2)]
[im 1/24]
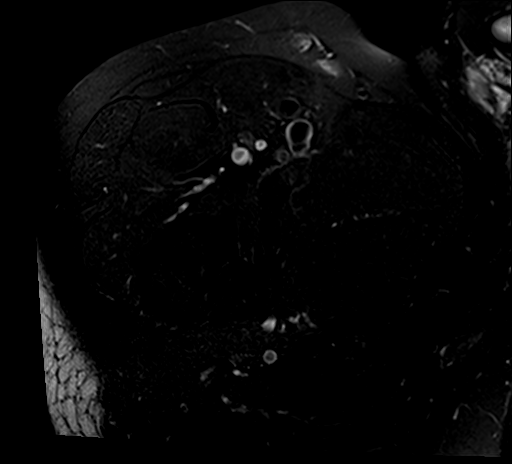
[im 4/24]
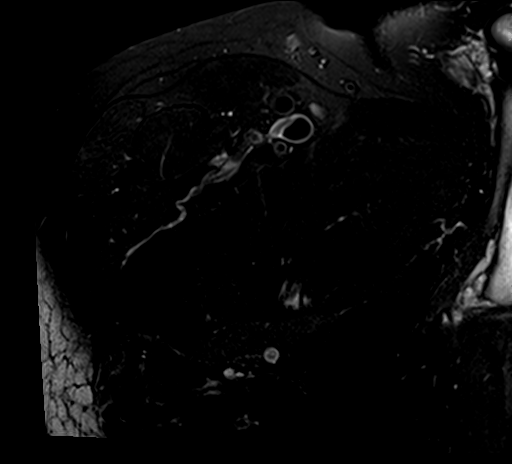
[im 8/24]
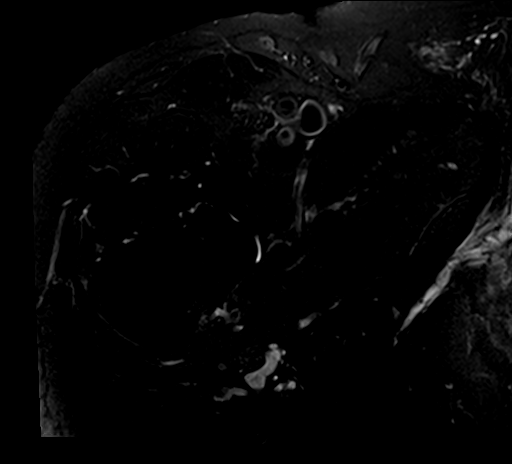
[im 12/24]
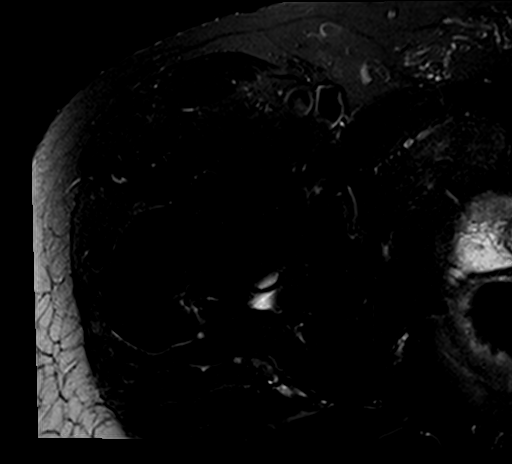
[im 16/24]
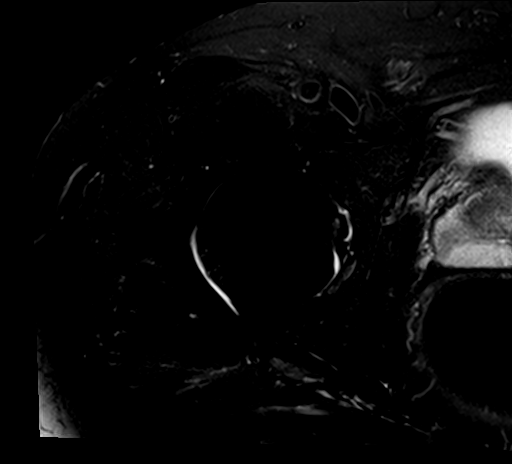
[im 20/24]
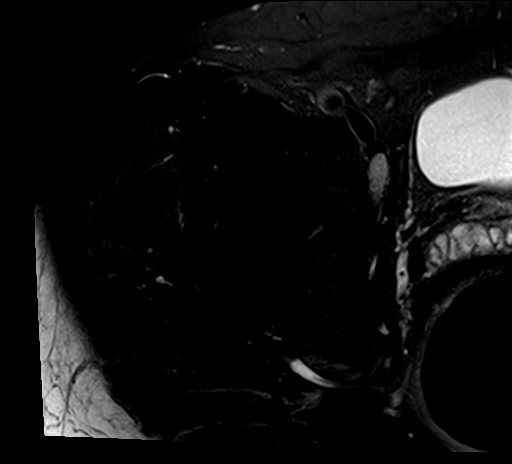
[im 24/24]
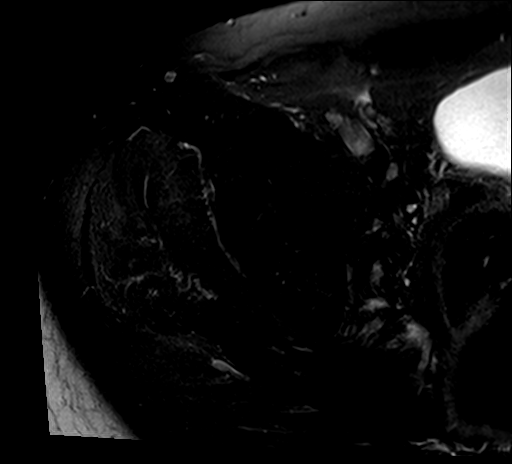

[30 of 40 positions shown; findings below may reference images not displayed]

FINDINGS: Bones: Normal. The bones of both hips appear normal. The sacroiliac
joints appear normal. The other pelvic bones appear normal.

Articular cartilage and labrum

Articular cartilage:  Normal.

Labrum:  Normal.

Joint or bursal effusion

Joint effusion:  No effusion.

Bursae: Bursitis.

Muscles and tendons

Muscles and tendons:  Normal.

Other findings

Miscellaneous: No adenopathy, mass lesions, or other significant
abnormality.
IMPRESSION: Normal MRI of the right hip.

## 2019-09-19 MED ORDER — GADOBENATE DIMEGLUMINE 529 MG/ML IV SOLN: 17.0000 mL | Freq: Once | INTRAVENOUS | Status: DC | PRN

## 2020-04-13 ENCOUNTER — Emergency Department (HOSPITAL_COMMUNITY)
Admission: EM | Admit: 2020-04-13 | Discharge: 2020-04-13 | Disposition: A | Discharge status: Home / Self Care | Payer: No Typology Code available for payment source | Attending: Emergency Medicine | Admitting: Emergency Medicine

## 2020-04-13 ENCOUNTER — Encounter (HOSPITAL_COMMUNITY): Payer: Self-pay

## 2020-04-13 DIAGNOSIS — Z5321 Procedure and treatment not carried out due to patient leaving prior to being seen by health care provider: Secondary | ICD-10-CM | POA: Insufficient documentation

## 2020-04-13 DIAGNOSIS — I10 Essential (primary) hypertension: Secondary | ICD-10-CM | POA: Diagnosis present

## 2020-04-13 NOTE — ED Notes (Signed)
Pt called 3x for room placement. Eloped from waiting area.  

## 2020-04-13 NOTE — ED Triage Notes (Signed)
Pt presents with c/o hypertension, hx of same, pt has no symptoms, was told to come here by [place].

## 2020-04-14 ENCOUNTER — Other Ambulatory Visit: Payer: Self-pay

## 2020-04-14 ENCOUNTER — Emergency Department (HOSPITAL_COMMUNITY)
Admission: EM | Admit: 2020-04-14 | Discharge: 2020-04-14 | Disposition: A | Discharge status: Home / Self Care | Payer: No Typology Code available for payment source | Attending: Emergency Medicine | Admitting: Emergency Medicine

## 2020-04-14 ENCOUNTER — Encounter (HOSPITAL_COMMUNITY): Payer: Self-pay

## 2020-04-14 DIAGNOSIS — Z79899 Other long term (current) drug therapy: Secondary | ICD-10-CM | POA: Diagnosis not present

## 2020-04-14 DIAGNOSIS — I10 Essential (primary) hypertension: Secondary | ICD-10-CM | POA: Insufficient documentation

## 2020-04-14 DIAGNOSIS — E119 Type 2 diabetes mellitus without complications: Secondary | ICD-10-CM | POA: Diagnosis not present

## 2020-04-14 DIAGNOSIS — Z7984 Long term (current) use of oral hypoglycemic drugs: Secondary | ICD-10-CM | POA: Diagnosis not present

## 2020-04-14 NOTE — Discharge Instructions (Addendum)
Please continue taking your home blood pressure medicines as previously prescribed.  You need close follow-up with your primary doctor to monitor your blood pressure and manage your blood pressure medications.

## 2020-04-14 NOTE — ED Triage Notes (Signed)
Pt arrived via walk in, c/o HTN, x1 week, hx of same, no sx at this time. Called PCP at [facility], was told to go to ER.   Did not take BP meds this morning.

## 2020-04-14 NOTE — ED Provider Notes (Signed)
[facility] DEPT Provider Note   CSN: [id number] Arrival date & time: 04/14/20  3704     History Chief Complaint  Patient presents with  . Hypertension    Jerry Moran is a 65 y.o. male.  Presents to ER with concern for hypertension.  Patient reports that he has a long history of high blood pressure and takes amlodipine and lisinopril.  He denies any recent medication changes.  Has not taken his medications today.  States that his numbers have been running high for the last week or so.  Up to 888B systolic at home.  He called his primary care office and he was told to come to ER for further eval.  Did not speak with and was not evaluated by provider.  Extensive review of systems, patient denies any chest pain difficulty breathing, decreased urination, numbness, weakness, headaches, speech changes or vision changes.  HPI     Past Medical History:  Diagnosis Date  . Diabetes mellitus without complication ([place])   . Hypertension   . Stroke [facility])     Patient Active Problem List   Diagnosis Date Noted  . CVA (cerebral vascular accident) ([place]) 01/22/2019  . Type 2 diabetes mellitus with hyperlipidemia ([place]) 01/22/2019  . Essential hypertension 01/22/2019  . Elevated serum creatinine 01/22/2019  . Stroke-like symptoms   . Slurred speech 01/14/2019  . TIA (transient ischemic attack) 01/14/2019    History reviewed. No pertinent surgical history.     History reviewed. No pertinent family history.  Social History   Tobacco Use  . Smoking status: Never Smoker  . Smokeless tobacco: Never Used  Substance Use Topics  . Alcohol use: Never  . Drug use: Never    Home Medications Prior to Admission medications   Medication Sig Start Date End Date Taking? Authorizing Provider  acetaminophen (TYLENOL) 500 MG tablet Take 500 mg by mouth daily.    [provider]  amLODipine (NORVASC) 10 MG tablet Take 1 tablet (10 mg total) by mouth  daily. 01/16/19   Kayleen Memos, DO  atorvastatin (LIPITOR) 80 MG tablet Take 1 tablet (80 mg total) by mouth daily at 6 PM. Patient taking differently: Take 40 mg by mouth daily.  01/16/19 05/09/19  Kayleen Memos, DO  blood glucose meter kit and supplies KIT Dispense based on patient and insurance preference. Use up to four times daily as directed. (FOR ICD-9 250.00, 250.01). 01/16/19   Kayleen Memos, DO  glipiZIDE (GLUCOTROL) 5 MG tablet Take 5 mg by mouth 2 (two) times daily before a meal.    [provider]  lisinopril (ZESTRIL) 10 MG tablet Take 1 tablet (10 mg total) by mouth daily. Patient taking differently: Take 40 mg by mouth daily.  01/16/19   Kayleen Memos, DO  metFORMIN (GLUCOPHAGE) 500 MG tablet Take 2 tablets (1,000 mg total) by mouth 2 (two) times daily with a meal. Patient taking differently: Take 2,000 mg by mouth 2 (two) times daily with a meal.  01/16/19 05/09/19  Kayleen Memos, DO  pioglitazone (ACTOS) 30 MG tablet Take 15 mg by mouth daily.    [provider]  tamsulosin (FLOMAX) 0.4 MG CAPS capsule Take 0.4 mg by mouth daily.    [provider]    Allergies    Patient has no known allergies.  Review of Systems   Review of Systems  Constitutional: Negative for chills and fever.  HENT: Negative for ear pain and sore throat.  Eyes: Negative for pain and visual disturbance.  Respiratory: Negative for cough and shortness of breath.   Cardiovascular: Negative for chest pain and palpitations.  Gastrointestinal: Negative for abdominal pain and vomiting.  Genitourinary: Negative for dysuria and hematuria.  Musculoskeletal: Negative for arthralgias and back pain.  Skin: Negative for color change and rash.  Neurological: Negative for seizures and syncope.  All other systems reviewed and are negative.   Physical Exam Updated Vital Signs BP (!) 194/94   Pulse 94   Temp 98.6 F (37 C)   Resp 16   SpO2 95%   Physical Exam Vitals and  nursing note reviewed.  Constitutional:      Appearance: He is well-developed and well-nourished.  HENT:     Head: Normocephalic and atraumatic.  Eyes:     Conjunctiva/sclera: Conjunctivae normal.  Cardiovascular:     Rate and Rhythm: Normal rate and regular rhythm.     Heart sounds: No murmur heard.   Pulmonary:     Effort: Pulmonary effort is normal. No respiratory distress.     Breath sounds: Normal breath sounds.  Abdominal:     Palpations: Abdomen is soft.     Tenderness: There is no abdominal tenderness.  Musculoskeletal:        General: No deformity, signs of injury or edema.     Cervical back: Neck supple.  Skin:    General: Skin is warm and dry.  Neurological:     General: No focal deficit present.     Mental Status: He is alert and oriented to person, place, and time.     Sensory: No sensory deficit.     Motor: No weakness.     Coordination: Coordination normal.     Gait: Gait normal.  Psychiatric:        Mood and Affect: Mood and affect and mood normal.        Behavior: Behavior normal.     ED Results / Procedures / Treatments   Labs (all labs ordered are listed, but only abnormal results are displayed) Labs Reviewed - No data to display  EKG None  Radiology No results found.  Procedures Procedures (including critical care time)  Medications Ordered in ED Medications - No data to display  ED Course  I have reviewed the triage vital signs and the nursing notes.  Pertinent labs & imaging results that were available during my care of the patient were reviewed by me and considered in my medical decision making (see chart for details).    MDM Rules/Calculators/A&P                          65 year old male presents to ER with concern for elevated blood pressure.  Long history of hypertension managed on multiple antihypertensive agents.  On extensive review of systems, patient denies any associated symptoms.  He is well-appearing in no distress.  No  signs or symptoms to suggest endorgan damage or acute hypertensive crisis.  In accordance with [facility] clinical policy regarding asymptomatic hypertension, do not see indication for emergent treatment or work up today.  Believe his chronic essential hypertension is best managed in the outpatient setting by his primary care doctor.  Believe patient would benefit from routine screening blood work at some point but do not believe this has to be done on an emergent basis.  Offered to give home BP meds and complete basic labs in ER but patient states he is comfortable following up  with his primary doctor.  Reviewed return precautions.  Discharged home.  After the discussed management above, the patient was determined to be safe for discharge.  The patient was in agreement with this plan and all questions regarding their care were answered.  ED return precautions were discussed and the patient will return to the ED with any significant worsening of condition.  Final Clinical Impression(s) / ED Diagnoses Final diagnoses:  Hypertension, unspecified type    Rx / DC Orders ED Discharge Orders    None       Lucrezia Starch, MD 04/14/20 1122

## 2020-05-03 ENCOUNTER — Encounter: Payer: Self-pay | Admitting: Radiation Oncology

## 2020-05-03 NOTE — Progress Notes (Signed)
GU Location of Tumor / Histology: prostatic adenocarcinoma  If Prostate Cancer, Gleason Score is (3 + 4) and PSA is (9.79). Prostate volume: 56.8 g.  Jerry Moran was referred to [facility] by the [facility] after an elevated PSA was discovered.   02/25/15 psa 1.33 05/07/2013 psa 1.19  Biopsies of prostate (if applicable) revealed:    Past/Anticipated interventions by urology, if any: prostate biopsy, referral to Dr. Tammi Klippel to discuss radiation options  Past/Anticipated interventions by medical oncology, if any: no  Weight changes, if any: no  Bowel/Bladder complaints, if any: IPSS 19. SHIM 1. Denies dysuria or hematuria. Reports urinary leakage. Denies any bowel complaints.   Nausea/Vomiting, if any: no  Pain issues, if any:  Denies new pain. Reports discomfort in his feet from swollen ankles related to effects of hypertension.  SAFETY ISSUES:  Prior radiation? denies  Pacemaker/ICD? denies  Possible current pregnancy? no, male patient  Is the patient on methotrexate? denies  Current Complaints / other details:  65 year old male. Resides in [place]. Hx of CVA in 2020 creating right sided weakness. Sister with history of breast ca.

## 2020-05-04 ENCOUNTER — Encounter: Payer: Self-pay | Admitting: Radiation Oncology

## 2020-05-04 ENCOUNTER — Ambulatory Visit
Admission: RE | Admit: 2020-05-04 | Discharge: 2020-05-04 | Disposition: A | Discharge status: Home / Self Care | Payer: Medicare PPO | Source: Ambulatory Visit | Attending: Radiation Oncology | Admitting: Radiation Oncology

## 2020-05-04 ENCOUNTER — Other Ambulatory Visit: Payer: Self-pay

## 2020-05-04 DIAGNOSIS — C61 Malignant neoplasm of prostate: Secondary | ICD-10-CM | POA: Insufficient documentation

## 2020-05-04 HISTORY — DX: Malignant neoplasm of prostate: C61

## 2020-05-04 NOTE — Progress Notes (Signed)
Radiation Oncology         [phone] ________________________________  Initial Outpatient Consultation - Conducted via Telephone due to current COVID-19 concerns for limiting patient exposure  Name: Jerry Moran MRN: [id number]  Date: 05/04/2020  DOB: 05/25/1955  WU:[id number], Jerry Moran  Jerry Moran*   REFERRING PHYSICIAN: Shelly Moran*  DIAGNOSIS: 65 y.o. gentleman with Stage T1c adenocarcinoma of the prostate with Gleason score of 3+4, and PSA of 9.79.    ICD-10-CM   1. Malignant neoplasm of prostate (HCC)  C61     HISTORY OF PRESENT ILLNESS: Jerry Moran is a 65 y.o. male with a diagnosis of prostate cancer. He has been followed by the [facility] and [facility] for a history of BPH with LUTS since at least 2016. More recently, he was noted to have an elevated PSA of 9.79 by his primary care physician, Dr. Vikki Moran with the [facility]. His PSA has been rising slowly over the years, but the most recent PSA was a significant increase from his prior of 3.94 in 06/2018.  Accordingly, he was referred for evaluation in urology by Dr. Liliane Moran on 12/11/2019,  digital rectal examination was performed at that time revealing no nodules.  The patient proceeded to transrectal ultrasound with 12 biopsies of the prostate on 03/15/2020.  The prostate volume measured 56.8 cc.  Out of 12 core biopsies, 2 were positive.  The maximum Gleason score was 3+4, and this was seen in the left base and left apex.  The patient reviewed the biopsy results with his urologist and he has kindly been referred today for discussion of potential radiation treatment options. He has also recently met with Dr. Marlou Moran on 04/05/2020 to discuss robotic prostatectomy.  His wife Jerry Moran is also available on this telephone consult today and participates in the visit with him.   PREVIOUS RADIATION THERAPY: No  PAST MEDICAL HISTORY:  Past Medical History:  Diagnosis Date  . Diabetes mellitus  without complication (HCC)   . Hypertension   . Prostate cancer (HCC)   . Stroke [facility])       PAST SURGICAL HISTORY: Past Surgical History:  Procedure Laterality Date  . PROSTATE BIOPSY      FAMILY HISTORY:  Family History  Problem Relation Age of Onset  . Breast cancer Mother   . Colon cancer Neg Hx   . Pancreatic cancer Neg Hx   . Prostate cancer Neg Hx     SOCIAL HISTORY: He is married and lives at home, and Jerry Moran, with his wife Jerry Moran and their 58-year-old daughter that has special needs and currently with a tracheostomy. Social History   Socioeconomic History  . Marital status: Married    Spouse name: Jerry Moran  . Number of children: Not on file  . Years of education: Not on file  . Highest education level: Not on file  Occupational History  . Not on file  Tobacco Use  . Smoking status: Never Smoker  . Smokeless tobacco: Never Used  Vaping Use  . Vaping Use: Never used  Substance and Sexual Activity  . Alcohol use: Never  . Drug use: Never  . Sexual activity: Not Currently  Other Topics Concern  . Not on file  Social History Narrative  . Not on file   Social Determinants of Health   Financial Resource Strain: Not on file  Food Insecurity: Not on file  Transportation Needs: Not on file  Physical Activity: Not on file  Stress: Not on file  Social Connections:  Not on file  Intimate Partner Violence: Not on file    ALLERGIES: Patient has no known allergies.  MEDICATIONS:  Current Outpatient Medications  Medication Sig Dispense Refill  . amLODipine (NORVASC) 10 MG tablet Take 1 tablet (10 mg total) by mouth daily. 30 tablet 0  . aspirin EC 81 MG tablet Take 81 mg by mouth daily. Swallow whole.    Jerry Moran Kitchen atorvastatin (LIPITOR) 80 MG tablet Take 1 tablet (80 mg total) by mouth daily at 6 PM. (Patient taking differently: Take 40 mg by mouth daily.) 30 tablet 0  . blood glucose meter kit and supplies KIT Dispense based on patient and insurance preference. Use  up to four times daily as directed. (FOR ICD-9 250.00, 250.01). 1 each 0  . glipiZIDE (GLUCOTROL) 5 MG tablet Take 5 mg by mouth 2 (two) times daily before a meal.    . lisinopril (ZESTRIL) 10 MG tablet Take 1 tablet (10 mg total) by mouth daily. (Patient taking differently: Take 40 mg by mouth daily.) 30 tablet 0  . metFORMIN (GLUCOPHAGE) 500 MG tablet Take 2 tablets (1,000 mg total) by mouth 2 (two) times daily with a meal. (Patient taking differently: Take 2,000 mg by mouth 2 (two) times daily with a meal.) 120 tablet 0  . tamsulosin (FLOMAX) 0.4 MG CAPS capsule Take 0.4 mg by mouth daily.     No current facility-administered medications for this encounter.    REVIEW OF SYSTEMS:  On review of systems, the patient reports that he is doing well overall. He denies any chest pain, shortness of breath, cough, fevers, chills, night sweats, unintended weight changes. He denies any bowel disturbances, and denies abdominal pain, nausea or vomiting. He denies any new musculoskeletal or joint aches or pains. His IPSS was 19, indicating moderate urinary symptoms despite taking tamsulosin daily. He reports urinary frequency, urgency, and associated leakage, as well as nocturia. He denies having to strain to void or having a weak urine stream. His SHIM was 1, indicating he has severe erectile dysfunction. A complete review of systems is obtained and is otherwise negative.    PHYSICAL EXAM:  Wt Readings from Last 3 Encounters:  05/04/20 150 lb (68 kg)  01/13/19 180 lb (81.6 kg)   Temp Readings from Last 3 Encounters:  04/14/20 98.6 F (37 C)  04/13/20 97.8 F (36.6 C) (Oral)  05/09/19 98.2 F (36.8 C) (Oral)   BP Readings from Last 3 Encounters:  04/14/20 (!) 194/94  04/13/20 (!) 183/95  05/09/19 (!) 148/80   Pulse Readings from Last 3 Encounters:  04/14/20 94  04/13/20 87  05/09/19 72   Pain Assessment Pain Score: 0-No pain/10  Physical exam not performed in light of telephone consult  visit format.   KPS = 90  100 - Normal; no complaints; no evidence of disease. 90   - Able to carry on normal activity; minor signs or symptoms of disease. 80   - Normal activity with effort; some signs or symptoms of disease. 57   - Cares for self; unable to carry on normal activity or to do active work. 60   - Requires occasional assistance, but is able to care for most of his personal needs. 50   - Requires considerable assistance and frequent medical care. 40   - Disabled; requires special care and assistance. 30   - Severely disabled; hospital admission is indicated although death not imminent. 20   - Very sick; hospital admission necessary; active supportive treatment necessary. 10   -  Moribund; fatal processes progressing rapidly. 0     - Dead  Karnofsky DA, Abelmann WH, Craver LS and Burchenal [facility] [phone]) The use of the nitrogen mustards in the palliative treatment of carcinoma: with particular reference to bronchogenic carcinoma Cancer 1 634-56  LABORATORY DATA:  Lab Results  Component Value Date   WBC 7.4 05/09/2019   HGB 13.1 05/09/2019   HCT 38.6 (L) 05/09/2019   MCV 91.9 05/09/2019   PLT 200 05/09/2019   Lab Results  Component Value Date   NA 139 05/09/2019   K 3.9 05/09/2019   CL 104 05/09/2019   CO2 26 05/09/2019   Lab Results  Component Value Date   ALT 21 05/09/2019   AST 20 05/09/2019   ALKPHOS 62 05/09/2019   BILITOT 1.1 05/09/2019     RADIOGRAPHY: No results found.    IMPRESSION/PLAN: This visit was conducted via Telephone to spare the patient unnecessary potential exposure in the healthcare setting during the current COVID-19 pandemic. 1. 65 y.o. gentleman with Stage T1c adenocarcinoma of the prostate with Gleason Score of 3+4, and PSA of 9.79. We discussed the patient's workup and outlined the nature of prostate cancer in this setting. The patient's T stage, Gleason's score, and PSA put him into the favorable intermediate risk group. Accordingly, he  is eligible for a variety of potential treatment options including brachytherapy, 5.5 weeks of external radiation, or prostatectomy. We discussed that he may not be an ideal candidate for brachytherapy given his current urinary symptoms despite the medical therapy with tamsulosin. We discussed the available radiation techniques, and focused on the details and logistics and delivery. We discussed and outlined the risks, benefits, short and long-term effects associated with radiotherapy and compared and contrasted these with prostatectomy. We discussed the role of SpaceOAR in reducing the rectal toxicity associated with radiotherapy.  He and his wife were encouraged to ask questions that were answered to their stated satisfaction.  At the end of the conversation, the patient is interested in proceeding with 5.5 weeks of external beam therapy. We will share our discussion with Dr. Liliane Moran and make arrangements for fiducial markers and SpaceOAR gel placement, prior to simulation, to reduce rectal toxicity from radiotherapy. The patient appears to have a good understanding of his disease and our treatment recommendations which are of curative intent and is in agreement with the stated plan. Therefore, we will move forward with treatment planning accordingly, in anticipation of beginning IMRT in the near future.   Given current concerns for patient exposure during the COVID-19 pandemic, this encounter was conducted via telephone. The patient was notified in advance and was offered a MyChart meeting to allow for face to face communication but unfortunately reported that he did not have the appropriate resources/technology to support such a visit and instead preferred to proceed with telephone consult. The patient has given verbal consent for this type of encounter. The time spent during this encounter was 45 minutes. The attendants for this meeting include Jerry Dys MD, Jerry Bruning [name], Katie Daubenspeck-  scribe, and patient, Jerry Moran and his wife. During the encounter, Jerry Dys MD, Jerry Bruning [name], and scribe, Jerry Moran were located at [facility] Radiation Oncology Department.  Patient, Jerry Moran and his wife were located at home.    Marguarite Arbour, [name]    Jerry Dys, MD  [facility]  Radiation Oncology Direct Dial: [phone]  Fax: [phone] [email or web address]  Skype  [email or web address]  This document serves as a record  of services personally performed by Jerry Dys, MD and Jerry Fennel, [name]. It was created on their behalf by Jerry Moran, a trained medical scribe. The creation of this record is based on the scribe's personal observations and the provider's statements to them. This document has been checked and approved by the attending provider.

## 2020-05-17 ENCOUNTER — Telehealth: Payer: Self-pay | Admitting: *Deleted

## 2020-05-17 NOTE — Telephone Encounter (Signed)
RETURNED PATIENT'S WIFE PHONE CALL, SPOKE WITH PATIENT'S WIFE 

## 2020-05-19 ENCOUNTER — Telehealth: Payer: Self-pay | Admitting: *Deleted

## 2020-05-19 NOTE — Telephone Encounter (Signed)
CALLED PATIENT TO INFORM OF FID. MARKER AND SPACE OAR PLACEMENT ON 06-08-20 @ 8:30 AM @ [facility] AND HIS SIM WILL BE ON 06-11-20 @ DR.MANNING'S OFFICE, LVM FOR A RETURN CALL

## 2020-05-20 ENCOUNTER — Telehealth: Payer: Self-pay | Admitting: Radiation Oncology

## 2020-05-20 NOTE — Telephone Encounter (Signed)
Received voicemail message from patient's spouse, Almyra Free, requesting a return call. Phoned her back to inquire. She questions if her husband will receive radiation therapy when he comes in on 06/11/2020. Explained this appointment is a CT simulation or planning scan and that no radiation will be received this day. Explained her husband will receive a treatment calendar following his simulation that will illustrate his start date. Almyra Free verbalized understanding and expressed appreciation for the call back.

## 2020-06-10 ENCOUNTER — Telehealth: Payer: Self-pay | Admitting: *Deleted

## 2020-06-10 NOTE — Telephone Encounter (Signed)
Called patient to remind of sim appt. for 06-11-20- arrival time- 1:15 pm @ [place],  lvm for a return call

## 2020-06-11 ENCOUNTER — Other Ambulatory Visit: Payer: Self-pay

## 2020-06-11 ENCOUNTER — Ambulatory Visit
Admission: RE | Admit: 2020-06-11 | Discharge: 2020-06-11 | Disposition: A | Discharge status: Home / Self Care | Payer: No Typology Code available for payment source | Source: Ambulatory Visit | Attending: Radiation Oncology | Admitting: Radiation Oncology

## 2020-06-11 DIAGNOSIS — C61 Malignant neoplasm of prostate: Secondary | ICD-10-CM | POA: Diagnosis present

## 2020-06-11 DIAGNOSIS — Z510 Encounter for antineoplastic radiation therapy: Secondary | ICD-10-CM | POA: Insufficient documentation

## 2020-06-11 NOTE — Progress Notes (Signed)
  Radiation Oncology         [phone] ________________________________  Name: Jerry Moran MRN: [id number]  Date: 06/11/2020  DOB: 01-03-1956  SIMULATION AND TREATMENT PLANNING NOTE    ICD-10-CM   1. Malignant neoplasm of prostate ([place])  C61     DIAGNOSIS:  65 y.o. gentleman with Stage T1c adenocarcinoma of the prostate with Gleason score of 3+4, and PSA of 9.79  NARRATIVE:  The patient was brought to the [place].  Identity was confirmed.  All relevant records and images related to the planned course of therapy were reviewed.  The patient freely provided informed written consent to proceed with treatment after reviewing the details related to the planned course of therapy. The consent form was witnessed and verified by the simulation staff.  Then, the patient was set-up in a stable reproducible supine position for radiation therapy.  A vacuum lock pillow device was custom fabricated to position his legs in a reproducible immobilized position.  Then, I performed a urethrogram under sterile conditions to identify the prostatic apex.  CT images were obtained.  Surface markings were placed.  The CT images were loaded into the planning software.  Then the prostate target and avoidance structures including the rectum, bladder, bowel and hips were contoured.  Treatment planning then occurred.  The radiation prescription was entered and confirmed.  A total of one complex treatment devices was fabricated. I have requested : Intensity Modulated Radiotherapy (IMRT) is medically necessary for this case for the following reason:  Rectal sparing.Marland Kitchen  PLAN:  The patient will receive 70 Gy in 28 fractions of 2.5 Gy.  ________________________________  Sheral Apley. Tammi Klippel, M.D.

## 2020-06-18 DIAGNOSIS — Z510 Encounter for antineoplastic radiation therapy: Secondary | ICD-10-CM | POA: Diagnosis not present

## 2020-06-22 ENCOUNTER — Ambulatory Visit
Admission: RE | Admit: 2020-06-22 | Discharge: 2020-06-22 | Disposition: A | Discharge status: Home / Self Care | Payer: No Typology Code available for payment source | Source: Ambulatory Visit | Attending: Radiation Oncology | Admitting: Radiation Oncology

## 2020-06-22 ENCOUNTER — Other Ambulatory Visit: Payer: Self-pay

## 2020-06-22 DIAGNOSIS — Z510 Encounter for antineoplastic radiation therapy: Secondary | ICD-10-CM | POA: Diagnosis not present

## 2020-06-22 DIAGNOSIS — C61 Malignant neoplasm of prostate: Secondary | ICD-10-CM

## 2020-06-23 ENCOUNTER — Ambulatory Visit: Payer: No Typology Code available for payment source

## 2020-06-24 ENCOUNTER — Other Ambulatory Visit: Payer: Self-pay

## 2020-06-24 ENCOUNTER — Ambulatory Visit
Admission: RE | Admit: 2020-06-24 | Discharge: 2020-06-24 | Disposition: A | Discharge status: Home / Self Care | Payer: No Typology Code available for payment source | Source: Ambulatory Visit | Attending: Radiation Oncology | Admitting: Radiation Oncology

## 2020-06-24 DIAGNOSIS — Z510 Encounter for antineoplastic radiation therapy: Secondary | ICD-10-CM | POA: Diagnosis not present

## 2020-06-25 ENCOUNTER — Ambulatory Visit
Admission: RE | Admit: 2020-06-25 | Discharge: 2020-06-25 | Disposition: A | Discharge status: Home / Self Care | Payer: No Typology Code available for payment source | Source: Ambulatory Visit | Attending: Radiation Oncology | Admitting: Radiation Oncology

## 2020-06-25 DIAGNOSIS — Z510 Encounter for antineoplastic radiation therapy: Secondary | ICD-10-CM | POA: Diagnosis not present

## 2020-06-28 ENCOUNTER — Ambulatory Visit
Admission: RE | Admit: 2020-06-28 | Discharge: 2020-06-28 | Disposition: A | Discharge status: Home / Self Care | Payer: No Typology Code available for payment source | Source: Ambulatory Visit | Attending: Radiation Oncology | Admitting: Radiation Oncology

## 2020-06-28 ENCOUNTER — Other Ambulatory Visit: Payer: Self-pay

## 2020-06-28 DIAGNOSIS — Z510 Encounter for antineoplastic radiation therapy: Secondary | ICD-10-CM | POA: Diagnosis not present

## 2020-06-29 ENCOUNTER — Ambulatory Visit
Admission: RE | Admit: 2020-06-29 | Discharge: 2020-06-29 | Disposition: A | Discharge status: Home / Self Care | Payer: No Typology Code available for payment source | Source: Ambulatory Visit | Attending: Radiation Oncology | Admitting: Radiation Oncology

## 2020-06-29 DIAGNOSIS — Z510 Encounter for antineoplastic radiation therapy: Secondary | ICD-10-CM | POA: Diagnosis not present

## 2020-06-30 ENCOUNTER — Ambulatory Visit
Admission: RE | Admit: 2020-06-30 | Discharge: 2020-06-30 | Disposition: A | Discharge status: Home / Self Care | Payer: No Typology Code available for payment source | Source: Ambulatory Visit | Attending: Radiation Oncology | Admitting: Radiation Oncology

## 2020-06-30 ENCOUNTER — Other Ambulatory Visit: Payer: Self-pay

## 2020-06-30 DIAGNOSIS — Z510 Encounter for antineoplastic radiation therapy: Secondary | ICD-10-CM | POA: Diagnosis not present

## 2020-07-01 ENCOUNTER — Ambulatory Visit
Admission: RE | Admit: 2020-07-01 | Discharge: 2020-07-01 | Disposition: A | Discharge status: Home / Self Care | Payer: No Typology Code available for payment source | Source: Ambulatory Visit | Attending: Radiation Oncology | Admitting: Radiation Oncology

## 2020-07-01 ENCOUNTER — Other Ambulatory Visit: Payer: Self-pay

## 2020-07-01 DIAGNOSIS — Z510 Encounter for antineoplastic radiation therapy: Secondary | ICD-10-CM | POA: Diagnosis not present

## 2020-07-02 ENCOUNTER — Ambulatory Visit: Payer: No Typology Code available for payment source

## 2020-07-05 ENCOUNTER — Ambulatory Visit
Admission: RE | Admit: 2020-07-05 | Discharge: 2020-07-05 | Disposition: A | Discharge status: Home / Self Care | Payer: No Typology Code available for payment source | Source: Ambulatory Visit | Attending: Radiation Oncology | Admitting: Radiation Oncology

## 2020-07-05 ENCOUNTER — Other Ambulatory Visit: Payer: Self-pay

## 2020-07-05 DIAGNOSIS — Z510 Encounter for antineoplastic radiation therapy: Secondary | ICD-10-CM | POA: Insufficient documentation

## 2020-07-05 DIAGNOSIS — C61 Malignant neoplasm of prostate: Secondary | ICD-10-CM | POA: Diagnosis present

## 2020-07-06 ENCOUNTER — Ambulatory Visit
Admission: RE | Admit: 2020-07-06 | Discharge: 2020-07-06 | Disposition: A | Discharge status: Home / Self Care | Payer: No Typology Code available for payment source | Source: Ambulatory Visit | Attending: Radiation Oncology | Admitting: Radiation Oncology

## 2020-07-06 DIAGNOSIS — Z510 Encounter for antineoplastic radiation therapy: Secondary | ICD-10-CM | POA: Diagnosis not present

## 2020-07-07 ENCOUNTER — Ambulatory Visit
Admission: RE | Admit: 2020-07-07 | Discharge: 2020-07-07 | Disposition: A | Discharge status: Home / Self Care | Payer: No Typology Code available for payment source | Source: Ambulatory Visit | Attending: Radiation Oncology | Admitting: Radiation Oncology

## 2020-07-07 DIAGNOSIS — Z510 Encounter for antineoplastic radiation therapy: Secondary | ICD-10-CM | POA: Diagnosis not present

## 2020-07-08 ENCOUNTER — Ambulatory Visit
Admission: RE | Admit: 2020-07-08 | Discharge: 2020-07-08 | Disposition: A | Discharge status: Home / Self Care | Payer: No Typology Code available for payment source | Source: Ambulatory Visit | Attending: Radiation Oncology | Admitting: Radiation Oncology

## 2020-07-08 ENCOUNTER — Other Ambulatory Visit: Payer: Self-pay

## 2020-07-08 DIAGNOSIS — Z510 Encounter for antineoplastic radiation therapy: Secondary | ICD-10-CM | POA: Diagnosis not present

## 2020-07-09 ENCOUNTER — Ambulatory Visit: Payer: No Typology Code available for payment source

## 2020-07-09 ENCOUNTER — Ambulatory Visit
Admission: RE | Admit: 2020-07-09 | Discharge: 2020-07-09 | Disposition: A | Discharge status: Home / Self Care | Payer: No Typology Code available for payment source | Source: Ambulatory Visit | Attending: Radiation Oncology | Admitting: Radiation Oncology

## 2020-07-09 DIAGNOSIS — Z510 Encounter for antineoplastic radiation therapy: Secondary | ICD-10-CM | POA: Diagnosis not present

## 2020-07-12 ENCOUNTER — Other Ambulatory Visit: Payer: Self-pay

## 2020-07-12 ENCOUNTER — Ambulatory Visit
Admission: RE | Admit: 2020-07-12 | Discharge: 2020-07-12 | Disposition: A | Discharge status: Home / Self Care | Payer: No Typology Code available for payment source | Source: Ambulatory Visit | Attending: Radiation Oncology | Admitting: Radiation Oncology

## 2020-07-12 DIAGNOSIS — Z510 Encounter for antineoplastic radiation therapy: Secondary | ICD-10-CM | POA: Diagnosis not present

## 2020-07-13 ENCOUNTER — Ambulatory Visit
Admission: RE | Admit: 2020-07-13 | Discharge: 2020-07-13 | Disposition: A | Discharge status: Home / Self Care | Payer: No Typology Code available for payment source | Source: Ambulatory Visit | Attending: Radiation Oncology | Admitting: Radiation Oncology

## 2020-07-13 ENCOUNTER — Other Ambulatory Visit: Payer: Self-pay

## 2020-07-13 DIAGNOSIS — Z510 Encounter for antineoplastic radiation therapy: Secondary | ICD-10-CM | POA: Diagnosis not present

## 2020-07-14 ENCOUNTER — Other Ambulatory Visit: Payer: Self-pay

## 2020-07-14 ENCOUNTER — Ambulatory Visit
Admission: RE | Admit: 2020-07-14 | Discharge: 2020-07-14 | Disposition: A | Discharge status: Home / Self Care | Payer: No Typology Code available for payment source | Source: Ambulatory Visit | Attending: Radiation Oncology | Admitting: Radiation Oncology

## 2020-07-14 DIAGNOSIS — Z510 Encounter for antineoplastic radiation therapy: Secondary | ICD-10-CM | POA: Diagnosis not present

## 2020-07-15 ENCOUNTER — Ambulatory Visit
Admission: RE | Admit: 2020-07-15 | Discharge: 2020-07-15 | Disposition: A | Discharge status: Home / Self Care | Payer: No Typology Code available for payment source | Source: Ambulatory Visit | Attending: Radiation Oncology | Admitting: Radiation Oncology

## 2020-07-15 DIAGNOSIS — Z510 Encounter for antineoplastic radiation therapy: Secondary | ICD-10-CM | POA: Diagnosis not present

## 2020-07-16 ENCOUNTER — Other Ambulatory Visit: Payer: Self-pay

## 2020-07-16 ENCOUNTER — Ambulatory Visit
Admission: RE | Admit: 2020-07-16 | Discharge: 2020-07-16 | Disposition: A | Discharge status: Home / Self Care | Payer: No Typology Code available for payment source | Source: Ambulatory Visit | Attending: Radiation Oncology | Admitting: Radiation Oncology

## 2020-07-16 DIAGNOSIS — Z510 Encounter for antineoplastic radiation therapy: Secondary | ICD-10-CM | POA: Diagnosis not present

## 2020-07-19 ENCOUNTER — Ambulatory Visit
Admission: RE | Admit: 2020-07-19 | Discharge: 2020-07-19 | Disposition: A | Discharge status: Home / Self Care | Payer: No Typology Code available for payment source | Source: Ambulatory Visit | Attending: Radiation Oncology | Admitting: Radiation Oncology

## 2020-07-19 ENCOUNTER — Other Ambulatory Visit: Payer: Self-pay

## 2020-07-19 DIAGNOSIS — Z510 Encounter for antineoplastic radiation therapy: Secondary | ICD-10-CM | POA: Diagnosis not present

## 2020-07-20 ENCOUNTER — Ambulatory Visit
Admission: RE | Admit: 2020-07-20 | Discharge: 2020-07-20 | Disposition: A | Discharge status: Home / Self Care | Payer: No Typology Code available for payment source | Source: Ambulatory Visit | Attending: Radiation Oncology | Admitting: Radiation Oncology

## 2020-07-20 DIAGNOSIS — Z510 Encounter for antineoplastic radiation therapy: Secondary | ICD-10-CM | POA: Diagnosis not present

## 2020-07-21 ENCOUNTER — Ambulatory Visit
Admission: RE | Admit: 2020-07-21 | Discharge: 2020-07-21 | Disposition: A | Discharge status: Home / Self Care | Payer: No Typology Code available for payment source | Source: Ambulatory Visit | Attending: Radiation Oncology | Admitting: Radiation Oncology

## 2020-07-21 ENCOUNTER — Other Ambulatory Visit: Payer: Self-pay

## 2020-07-21 DIAGNOSIS — Z510 Encounter for antineoplastic radiation therapy: Secondary | ICD-10-CM | POA: Diagnosis not present

## 2020-07-22 ENCOUNTER — Ambulatory Visit
Admission: RE | Admit: 2020-07-22 | Discharge: 2020-07-22 | Disposition: A | Discharge status: Home / Self Care | Payer: No Typology Code available for payment source | Source: Ambulatory Visit | Attending: Radiation Oncology | Admitting: Radiation Oncology

## 2020-07-22 DIAGNOSIS — Z510 Encounter for antineoplastic radiation therapy: Secondary | ICD-10-CM | POA: Diagnosis not present

## 2020-07-23 ENCOUNTER — Other Ambulatory Visit: Payer: Self-pay

## 2020-07-23 ENCOUNTER — Ambulatory Visit
Admission: RE | Admit: 2020-07-23 | Discharge: 2020-07-23 | Disposition: A | Discharge status: Home / Self Care | Payer: No Typology Code available for payment source | Source: Ambulatory Visit | Attending: Radiation Oncology | Admitting: Radiation Oncology

## 2020-07-23 DIAGNOSIS — Z510 Encounter for antineoplastic radiation therapy: Secondary | ICD-10-CM | POA: Diagnosis not present

## 2020-07-26 ENCOUNTER — Ambulatory Visit
Admission: RE | Admit: 2020-07-26 | Discharge: 2020-07-26 | Disposition: A | Discharge status: Home / Self Care | Payer: No Typology Code available for payment source | Source: Ambulatory Visit | Attending: Radiation Oncology | Admitting: Radiation Oncology

## 2020-07-26 DIAGNOSIS — Z510 Encounter for antineoplastic radiation therapy: Secondary | ICD-10-CM | POA: Diagnosis not present

## 2020-07-27 ENCOUNTER — Ambulatory Visit
Admission: RE | Admit: 2020-07-27 | Discharge: 2020-07-27 | Disposition: A | Discharge status: Home / Self Care | Payer: No Typology Code available for payment source | Source: Ambulatory Visit | Attending: Radiation Oncology | Admitting: Radiation Oncology

## 2020-07-27 DIAGNOSIS — Z510 Encounter for antineoplastic radiation therapy: Secondary | ICD-10-CM | POA: Diagnosis not present

## 2020-07-28 ENCOUNTER — Ambulatory Visit
Admission: RE | Admit: 2020-07-28 | Discharge: 2020-07-28 | Disposition: A | Discharge status: Home / Self Care | Payer: No Typology Code available for payment source | Source: Ambulatory Visit | Attending: Radiation Oncology | Admitting: Radiation Oncology

## 2020-07-28 ENCOUNTER — Other Ambulatory Visit: Payer: Self-pay

## 2020-07-28 DIAGNOSIS — Z510 Encounter for antineoplastic radiation therapy: Secondary | ICD-10-CM | POA: Diagnosis not present

## 2020-07-29 ENCOUNTER — Ambulatory Visit
Admission: RE | Admit: 2020-07-29 | Discharge: 2020-07-29 | Disposition: A | Discharge status: Home / Self Care | Payer: No Typology Code available for payment source | Source: Ambulatory Visit | Attending: Radiation Oncology | Admitting: Radiation Oncology

## 2020-07-29 ENCOUNTER — Ambulatory Visit: Payer: No Typology Code available for payment source

## 2020-07-29 DIAGNOSIS — Z510 Encounter for antineoplastic radiation therapy: Secondary | ICD-10-CM | POA: Diagnosis not present

## 2020-07-30 ENCOUNTER — Ambulatory Visit
Admission: RE | Admit: 2020-07-30 | Discharge: 2020-07-30 | Disposition: A | Discharge status: Home / Self Care | Payer: No Typology Code available for payment source | Source: Ambulatory Visit | Attending: Radiation Oncology | Admitting: Radiation Oncology

## 2020-07-30 ENCOUNTER — Ambulatory Visit: Payer: No Typology Code available for payment source

## 2020-07-30 ENCOUNTER — Other Ambulatory Visit: Payer: Self-pay

## 2020-07-30 DIAGNOSIS — Z510 Encounter for antineoplastic radiation therapy: Secondary | ICD-10-CM | POA: Diagnosis not present

## 2020-08-02 ENCOUNTER — Other Ambulatory Visit: Payer: Self-pay

## 2020-08-02 ENCOUNTER — Ambulatory Visit
Admission: RE | Admit: 2020-08-02 | Discharge: 2020-08-02 | Disposition: A | Discharge status: Home / Self Care | Payer: No Typology Code available for payment source | Source: Ambulatory Visit | Attending: Radiation Oncology | Admitting: Radiation Oncology

## 2020-08-02 ENCOUNTER — Encounter: Payer: Self-pay | Admitting: Urology

## 2020-08-02 DIAGNOSIS — C61 Malignant neoplasm of prostate: Secondary | ICD-10-CM | POA: Insufficient documentation

## 2020-08-02 DIAGNOSIS — Z510 Encounter for antineoplastic radiation therapy: Secondary | ICD-10-CM | POA: Insufficient documentation

## 2020-09-01 NOTE — Progress Notes (Signed)
Radiation Oncology         [phone] ________________________________  Name: Jerry Moran MRN: [id number]  Date: 09/02/2020  DOB: 1956/02/22  Post Treatment Note  CC: [facility]  Winter, Christopher Aar*  Diagnosis:   65 y.o. gentleman with Stage T1c adenocarcinoma of the prostate with Gleason score of 3+4, and PSA of 9.79  Interval Since Last Radiation:  4.5 weeks  06/22/20 - 08/02/20:  The prostate was treated to 70 Gy in 28 fractions of 2.5 Gy  Narrative:  I spoke with the patient to conduct his routine scheduled 67 month follow up visit via telephone to spare the patient unnecessary potential exposure in the healthcare setting during the current COVID-19 pandemic.  The patient was notified in advance and gave permission to proceed with this visit format.  He tolerated radiation treatment relatively well with only minor urinary irritation and modest fatigue.  He did report nocturia x4, intermittency, weak stream, hesitancy, increased frequency, urgency and dysuria at the start of his stream.  He denied any abdominal pain o bowel issues.                                On review of systems, the patient states that he is doing well in general. He continues with increased frequency, urgency, nocturia and incontinence, slightly increased from baseline but feels this is gradually improving. He denies any abdominal pain, N/V/D or constipation. He reports his appetite is stable and he is maintaining his weight. His energy level is gradually improving and overall, he is quite pleased with his progress to date.  ALLERGIES:  has No Known Allergies.  Meds: Current Outpatient Medications  Medication Sig Dispense Refill  . amLODipine (NORVASC) 10 MG tablet Take 1 tablet (10 mg total) by mouth daily. 30 tablet 0  . aspirin EC 81 MG tablet Take 81 mg by mouth daily. Swallow whole.    Marland Kitchen atorvastatin (LIPITOR) 80 MG tablet Take 1 tablet (80 mg total) by mouth daily at 6 PM. (Patient  taking differently: Take 40 mg by mouth daily.) 30 tablet 0  . blood glucose meter kit and supplies KIT Dispense based on patient and insurance preference. Use up to four times daily as directed. (FOR ICD-9 250.00, 250.01). 1 each 0  . glipiZIDE (GLUCOTROL) 5 MG tablet Take 5 mg by mouth 2 (two) times daily before a meal.    . lisinopril (ZESTRIL) 10 MG tablet Take 1 tablet (10 mg total) by mouth daily. (Patient taking differently: Take 40 mg by mouth daily.) 30 tablet 0  . metFORMIN (GLUCOPHAGE) 500 MG tablet Take 2 tablets (1,000 mg total) by mouth 2 (two) times daily with a meal. (Patient taking differently: Take 2,000 mg by mouth 2 (two) times daily with a meal.) 120 tablet 0  . tamsulosin (FLOMAX) 0.4 MG CAPS capsule Take 0.4 mg by mouth daily.     No current facility-administered medications for this visit.    Physical Findings:  vitals were not taken for this visit.   /Unable to assess due to telephone follow-up visit format.  Lab Findings: Lab Results  Component Value Date   WBC 7.4 05/09/2019   HGB 13.1 05/09/2019   HCT 38.6 (L) 05/09/2019   MCV 91.9 05/09/2019   PLT 200 05/09/2019     Radiographic Findings: No results found.  Impression/Plan: 1. 65 y.o. gentleman with Stage T1c adenocarcinoma of the prostate with Gleason score of  3+4, and PSA of 9.79 He will continue to follow up with urology for ongoing PSA determinations but does not an appointment scheduled with Dr. Lovena Neighbours to his knowledge. He understands what to expect with regards to PSA monitoring going forward. I will look forward to following his response to treatment via correspondence with urology, and would be happy to continue to participate in his care if clinically indicated. I talked to the patient about what to expect in the future, including his risk for erectile dysfunction and rectal bleeding. I encouraged him to call or return to the office if he has any questions regarding his previous radiation or  possible radiation side effects. He was comfortable with this plan and will follow up as needed.    Nicholos Johns, [name]

## 2020-09-01 NOTE — Progress Notes (Signed)
  Radiation Oncology         [phone]) [phone] ________________________________  Name: Jerry Moran MRN: [id number]  Date: 08/02/2020  DOB: [birth year]  End of Treatment Note  Diagnosis:   65 y.o. gentleman with Stage T1c adenocarcinoma of the prostate with Gleason score of 3+4, and PSA of 9.79     Indication for treatment:  Curative, Definitive Radiotherapy       Radiation treatment dates:   06/22/20 - 08/02/20  Site/dose:   The prostate was treated to 70 Gy in 28 fractions of 2.5 Gy  Beams/energy:   The patient was treated with IMRT using volumetric arc therapy delivering 6 MV X-rays to clockwise and counterclockwise circumferential arcs with a 90 degree collimator offset to avoid dose scalloping.  Image guidance was performed with daily cone beam CT prior to each fraction to align to gold markers in the prostate and assure proper bladder and rectal fill volumes.  Immobilization was achieved with BodyFix custom mold.  Narrative: The patient tolerated radiation treatment relatively well with only minor urinary irritation and modest fatigue.  He did report nocturia x4, intermittency, weak stream, hesitancy, increased frequency, urgency and dysuria at the start of his stream.  He denied any abdominal pain o bowel issues.   Plan: The patient has completed radiation treatment. He will return to radiation oncology clinic for routine followup in one month. I advised him to call or return sooner if he has any questions or concerns related to his recovery or treatment. ________________________________  Sheral Apley. Tammi Klippel, M.D.

## 2020-09-02 ENCOUNTER — Encounter: Payer: Self-pay | Admitting: Urology

## 2020-09-02 ENCOUNTER — Ambulatory Visit
Admission: RE | Admit: 2020-09-02 | Discharge: 2020-09-02 | Disposition: A | Discharge status: Home / Self Care | Payer: No Typology Code available for payment source | Source: Ambulatory Visit | Attending: Urology | Admitting: Urology

## 2020-09-02 ENCOUNTER — Other Ambulatory Visit: Payer: Self-pay

## 2020-09-02 VITALS — BP 174/91 | HR 71 | Temp 96.9°F | Resp 20 | Ht 68.0 in | Wt 173.2 lb

## 2020-09-02 DIAGNOSIS — C61 Malignant neoplasm of prostate: Secondary | ICD-10-CM

## 2020-09-02 NOTE — Progress Notes (Signed)
[place] 25 Goes to the [place] unaware if he has an appointment with [facility]. No other issues or complaints of patient has had a stroke so his memory is not clear.

## 2021-02-28 ENCOUNTER — Telehealth: Payer: Self-pay | Admitting: Radiation Oncology

## 2021-03-01 ENCOUNTER — Telehealth: Payer: Self-pay | Admitting: *Deleted

## 2021-03-01 NOTE — Telephone Encounter (Signed)
RETURNED PATIENT'S WIFE'S PHONE CALL, LVM FOR A RETURN CALL 

## 2021-03-31 ENCOUNTER — Telehealth: Payer: Self-pay

## 2021-03-31 NOTE — Telephone Encounter (Signed)
Called and left a friendly reminder of patient's 8:30am-04/01/21 in-person appointment w/ Ashlyn Bruning. Advised patient to arrive 15 min early for check-in. I left my extension [phone] for patient to call w/ any questions.

## 2021-04-01 ENCOUNTER — Other Ambulatory Visit: Payer: Self-pay

## 2021-04-01 ENCOUNTER — Encounter: Payer: Self-pay | Admitting: Urology

## 2021-04-01 ENCOUNTER — Ambulatory Visit
Admission: RE | Admit: 2021-04-01 | Discharge: 2021-04-01 | Disposition: A | Discharge status: Home / Self Care | Payer: No Typology Code available for payment source | Source: Ambulatory Visit | Attending: Radiation Oncology | Admitting: Radiation Oncology

## 2021-04-01 ENCOUNTER — Ambulatory Visit
Admission: RE | Admit: 2021-04-01 | Discharge: 2021-04-01 | Disposition: A | Discharge status: Home / Self Care | Payer: No Typology Code available for payment source | Source: Ambulatory Visit | Attending: Urology | Admitting: Urology

## 2021-04-01 VITALS — BP 150/93 | HR 73 | Temp 98.4°F | Resp 18 | Ht 68.0 in | Wt 185.0 lb

## 2021-04-01 DIAGNOSIS — Z7984 Long term (current) use of oral hypoglycemic drugs: Secondary | ICD-10-CM | POA: Diagnosis not present

## 2021-04-01 DIAGNOSIS — R339 Retention of urine, unspecified: Secondary | ICD-10-CM | POA: Diagnosis not present

## 2021-04-01 DIAGNOSIS — Z79899 Other long term (current) drug therapy: Secondary | ICD-10-CM | POA: Diagnosis not present

## 2021-04-01 DIAGNOSIS — R351 Nocturia: Secondary | ICD-10-CM | POA: Insufficient documentation

## 2021-04-01 DIAGNOSIS — Z7982 Long term (current) use of aspirin: Secondary | ICD-10-CM | POA: Diagnosis not present

## 2021-04-01 DIAGNOSIS — C61 Malignant neoplasm of prostate: Secondary | ICD-10-CM | POA: Diagnosis present

## 2021-04-01 NOTE — Progress Notes (Signed)
Radiation Oncology         [phone]) [phone] ________________________________  Name: Jerry Moran MRN: [id number]  Date: 04/01/2021  DOB: 25-Dec-1955  Follow Up Note  CC: [facility], [facility], NP  Diagnosis:   65 y.o. gentleman with Stage T1c adenocarcinoma of the prostate with Gleason score of 3+4, and PSA of 9.79  Interval Since Last Radiation:  8 months  06/22/20 - 08/02/20:  The prostate was treated to 70 Gy in 28 fractions of 2.5 Gy  Narrative: Jerry Moran returns today for follow up. He was referred back to [place] by the [facility] for follow up.  He has been doing very well and is currently without complaints. He saw Dr. Lovena Neighbours on 03/03/21 and PSA  from 02/10/21 was down to 3.43, from 9.79 pretreatment. Per Dr. Jackson Latino note, he is planning to repeat PSA in 3 months and the patient has a follow up appointment scheduled 06/02/20.                                 His IPSS score today was 6, indicating mild urinary symptoms. On review of systems, the patient reports occasional feelings of incomplete bladder emptying and nocturia x3 which is actually improved since completing radiation.  His pretreatment IPSS was 19.  He specifically denies dysuria, gross hematuria, straining to void, suprapubic discomfort or incontinence.  He denies abdominal pain, nausea, vomiting, diarrhea or constipation.  His energy level has returned to normal and overall, he is quite pleased with his progress to date.  ALLERGIES:  has No Known Allergies.  Meds: Current Outpatient Medications  Medication Sig Dispense Refill   amLODipine (NORVASC) 10 MG tablet Take 1 tablet (10 mg total) by mouth daily. 30 tablet 0   aspirin EC 81 MG tablet Take 81 mg by mouth daily. Swallow whole.     atorvastatin (LIPITOR) 80 MG tablet Take 1 tablet (80 mg total) by mouth daily at 6 PM. (Patient taking differently: Take 40 mg by mouth daily.) 30 tablet 0   blood glucose meter kit and supplies KIT Dispense based on  patient and insurance preference. Use up to four times daily as directed. (FOR ICD-9 250.00, 250.01). 1 each 0   glipiZIDE (GLUCOTROL) 5 MG tablet Take 5 mg by mouth 2 (two) times daily before a meal.     lisinopril (ZESTRIL) 40 MG tablet Take 1 tablet by mouth every morning.     metFORMIN (GLUCOPHAGE) 500 MG tablet Take 2 tablets (1,000 mg total) by mouth 2 (two) times daily with a meal. (Patient taking differently: Take 2,000 mg by mouth 2 (two) times daily with a meal.) 120 tablet 0   tamsulosin (FLOMAX) 0.4 MG CAPS capsule Take 0.4 mg by mouth daily.     No current facility-administered medications for this encounter.    Physical Findings:  height is _0  (1.727 m) and weight is 185 lb (83.9 kg). His oral temperature is 98.4 F (36.9 C). His blood pressure is 150/93 (abnormal) and his pulse is 73. His respiration is 18 and oxygen saturation is 100%.  Pain Assessment Pain Score: 0-No pain  In general this is a well appearing African-American male in no acute distress.  He's alert and oriented x4 and appropriate throughout the examination. Cardiopulmonary assessment is negative for acute distress and he exhibits normal effort.    Lab Findings: Lab Results  Component Value Date   WBC 7.4 05/09/2019  HGB 13.1 05/09/2019   HCT 38.6 (L) 05/09/2019   MCV 91.9 05/09/2019   PLT 200 05/09/2019     Radiographic Findings: No results found.  Impression/Plan: 1. 65 y.o. gentleman with Stage T1c adenocarcinoma of the prostate with Gleason score of 3+4, and PSA of 9.79 He will continue to follow up with urology for ongoing PSA determinations and is scheduled for a follow-up visit with Dr. Lovena Neighbours on 06/02/2020.  We again reviewed what to expect with regards to PSA monitoring going forward and he appears to have a good understanding. I will look forward to following his response to treatment via correspondence with urology, and would be happy to continue to participate in his care if clinically  indicated.  I encouraged him to call or return to the office if he has any questions regarding his previous radiation or possible radiation side effects. He was comfortable with this plan and will follow up as needed.    Jerry Johns, [name]   This document serves as a record of services personally performed by [employer], [name]. It was created on her behalf by Wilburn Mylar, a trained medical scribe. The creation of this record is based on the scribe's personal observations and the provider's statements to them. This document has been checked and approved by the attending provider.

## 2021-04-01 NOTE — Progress Notes (Signed)
Patient reports sensation of bladder not empty post voiding, polyuria and nocturia X3.No other symptoms reported at this time.  Meaningful use complete.   I-PSS score of 6 (mild).  Currently on Flomax 0.4mg  daily. Urology follow-up w/ [facility] scheduled for January 2023 -per patient.  BP (!) 150/93 (BP Location: Right Arm, Patient Position: Sitting, Cuff Size: Large)    Pulse 73    Temp 98.4 F (36.9 C) (Oral)    Resp 18    Ht 5\' 8"  (1.727 m)    Wt 185 lb (83.9 kg)    SpO2 100%    BMI 28.13 kg/m

## 2021-04-08 NOTE — Progress Notes (Signed)
RN successfully faxed patient's recent progress note from 04/01/2021 to [facility].    Voicemail left X 2 for [facility] @ the [place] in [place].  [phone].  Awaiting call back to update to ensure continuity of care.

## 2021-04-28 ENCOUNTER — Other Ambulatory Visit: Payer: Self-pay

## 2021-04-28 ENCOUNTER — Inpatient Hospital Stay (HOSPITAL_COMMUNITY)
Admission: EM | Admit: 2021-04-28 | Discharge: 2021-04-30 | DRG: 247 | Disposition: A | Discharge status: Home / Self Care | Payer: No Typology Code available for payment source | Attending: Cardiology | Admitting: Cardiology

## 2021-04-28 ENCOUNTER — Emergency Department (HOSPITAL_COMMUNITY): Payer: No Typology Code available for payment source

## 2021-04-28 DIAGNOSIS — E119 Type 2 diabetes mellitus without complications: Secondary | ICD-10-CM | POA: Diagnosis not present

## 2021-04-28 DIAGNOSIS — I129 Hypertensive chronic kidney disease with stage 1 through stage 4 chronic kidney disease, or unspecified chronic kidney disease: Secondary | ICD-10-CM | POA: Diagnosis not present

## 2021-04-28 DIAGNOSIS — Z20822 Contact with and (suspected) exposure to covid-19: Secondary | ICD-10-CM | POA: Diagnosis not present

## 2021-04-28 DIAGNOSIS — C61 Malignant neoplasm of prostate: Secondary | ICD-10-CM | POA: Diagnosis present

## 2021-04-28 DIAGNOSIS — I214 Non-ST elevation (NSTEMI) myocardial infarction: Secondary | ICD-10-CM | POA: Diagnosis not present

## 2021-04-28 DIAGNOSIS — Z8673 Personal history of transient ischemic attack (TIA), and cerebral infarction without residual deficits: Secondary | ICD-10-CM

## 2021-04-28 DIAGNOSIS — I10 Essential (primary) hypertension: Secondary | ICD-10-CM

## 2021-04-28 DIAGNOSIS — Z7982 Long term (current) use of aspirin: Secondary | ICD-10-CM | POA: Diagnosis not present

## 2021-04-28 DIAGNOSIS — Z79899 Other long term (current) drug therapy: Secondary | ICD-10-CM

## 2021-04-28 DIAGNOSIS — Z955 Presence of coronary angioplasty implant and graft: Secondary | ICD-10-CM

## 2021-04-28 DIAGNOSIS — Z7984 Long term (current) use of oral hypoglycemic drugs: Secondary | ICD-10-CM

## 2021-04-28 DIAGNOSIS — I25110 Atherosclerotic heart disease of native coronary artery with unstable angina pectoris: Secondary | ICD-10-CM | POA: Diagnosis not present

## 2021-04-28 DIAGNOSIS — N1831 Chronic kidney disease, stage 3a: Secondary | ICD-10-CM | POA: Diagnosis not present

## 2021-04-28 DIAGNOSIS — I2510 Atherosclerotic heart disease of native coronary artery without angina pectoris: Secondary | ICD-10-CM

## 2021-04-28 DIAGNOSIS — E1122 Type 2 diabetes mellitus with diabetic chronic kidney disease: Secondary | ICD-10-CM | POA: Diagnosis present

## 2021-04-28 DIAGNOSIS — N1832 Chronic kidney disease, stage 3b: Secondary | ICD-10-CM | POA: Diagnosis not present

## 2021-04-28 DIAGNOSIS — E785 Hyperlipidemia, unspecified: Secondary | ICD-10-CM | POA: Diagnosis not present

## 2021-04-28 DIAGNOSIS — E7800 Pure hypercholesterolemia, unspecified: Secondary | ICD-10-CM | POA: Diagnosis not present

## 2021-04-28 DIAGNOSIS — I219 Acute myocardial infarction, unspecified: Secondary | ICD-10-CM | POA: Diagnosis not present

## 2021-04-28 DIAGNOSIS — R079 Chest pain, unspecified: Secondary | ICD-10-CM

## 2021-04-28 LAB — RESP PANEL BY RT-PCR (FLU A&B, COVID) ARPGX2
Influenza A by PCR: NEGATIVE
Influenza B by PCR: NEGATIVE
SARS Coronavirus 2 by RT PCR: NEGATIVE

## 2021-04-28 LAB — BASIC METABOLIC PANEL
Anion gap: 10 (ref 5–15)
BUN: 16 mg/dL (ref 8–23)
CO2: 25 mmol/L (ref 22–32)
Calcium: 8.8 mg/dL — ABNORMAL LOW (ref 8.9–10.3)
Chloride: 104 mmol/L (ref 98–111)
Creatinine, Ser: 1.82 mg/dL — ABNORMAL HIGH (ref 0.61–1.24)
GFR, Estimated: 40 mL/min — ABNORMAL LOW (ref >60)
Glucose, Bld: 258 mg/dL — ABNORMAL HIGH (ref 70–99)
Potassium: 3.5 mmol/L (ref 3.5–5.1)
Sodium: 139 mmol/L (ref 135–145)

## 2021-04-28 LAB — CBC
HCT: 34.3 % — ABNORMAL LOW (ref 39.0–52.0)
Hemoglobin: 11.4 g/dL — ABNORMAL LOW (ref 13.0–17.0)
MCH: 30.6 pg (ref 26.0–34.0)
MCHC: 33.2 g/dL (ref 30.0–36.0)
MCV: 92 fL (ref 80.0–100.0)
Platelets: 217 10*3/uL (ref 150–400)
RBC: 3.73 MIL/uL — ABNORMAL LOW (ref 4.22–5.81)
RDW: 12.8 % (ref 11.5–15.5)
WBC: 5 10*3/uL (ref 4.0–10.5)
nRBC: 0 % (ref 0.0–0.2)

## 2021-04-28 LAB — TROPONIN I (HIGH SENSITIVITY)
Troponin I (High Sensitivity): 321 ng/L (ref <18)
Troponin I (High Sensitivity): 311 ng/L (ref <18)

## 2021-04-28 IMAGING — DX DG CHEST 1V
1 series · 1 of 1 positions shown · non-contrast
Comparison: None.

CLINICAL DATA: Chest pain

EXAM:
CHEST  1 VIEW

[w chest pa]
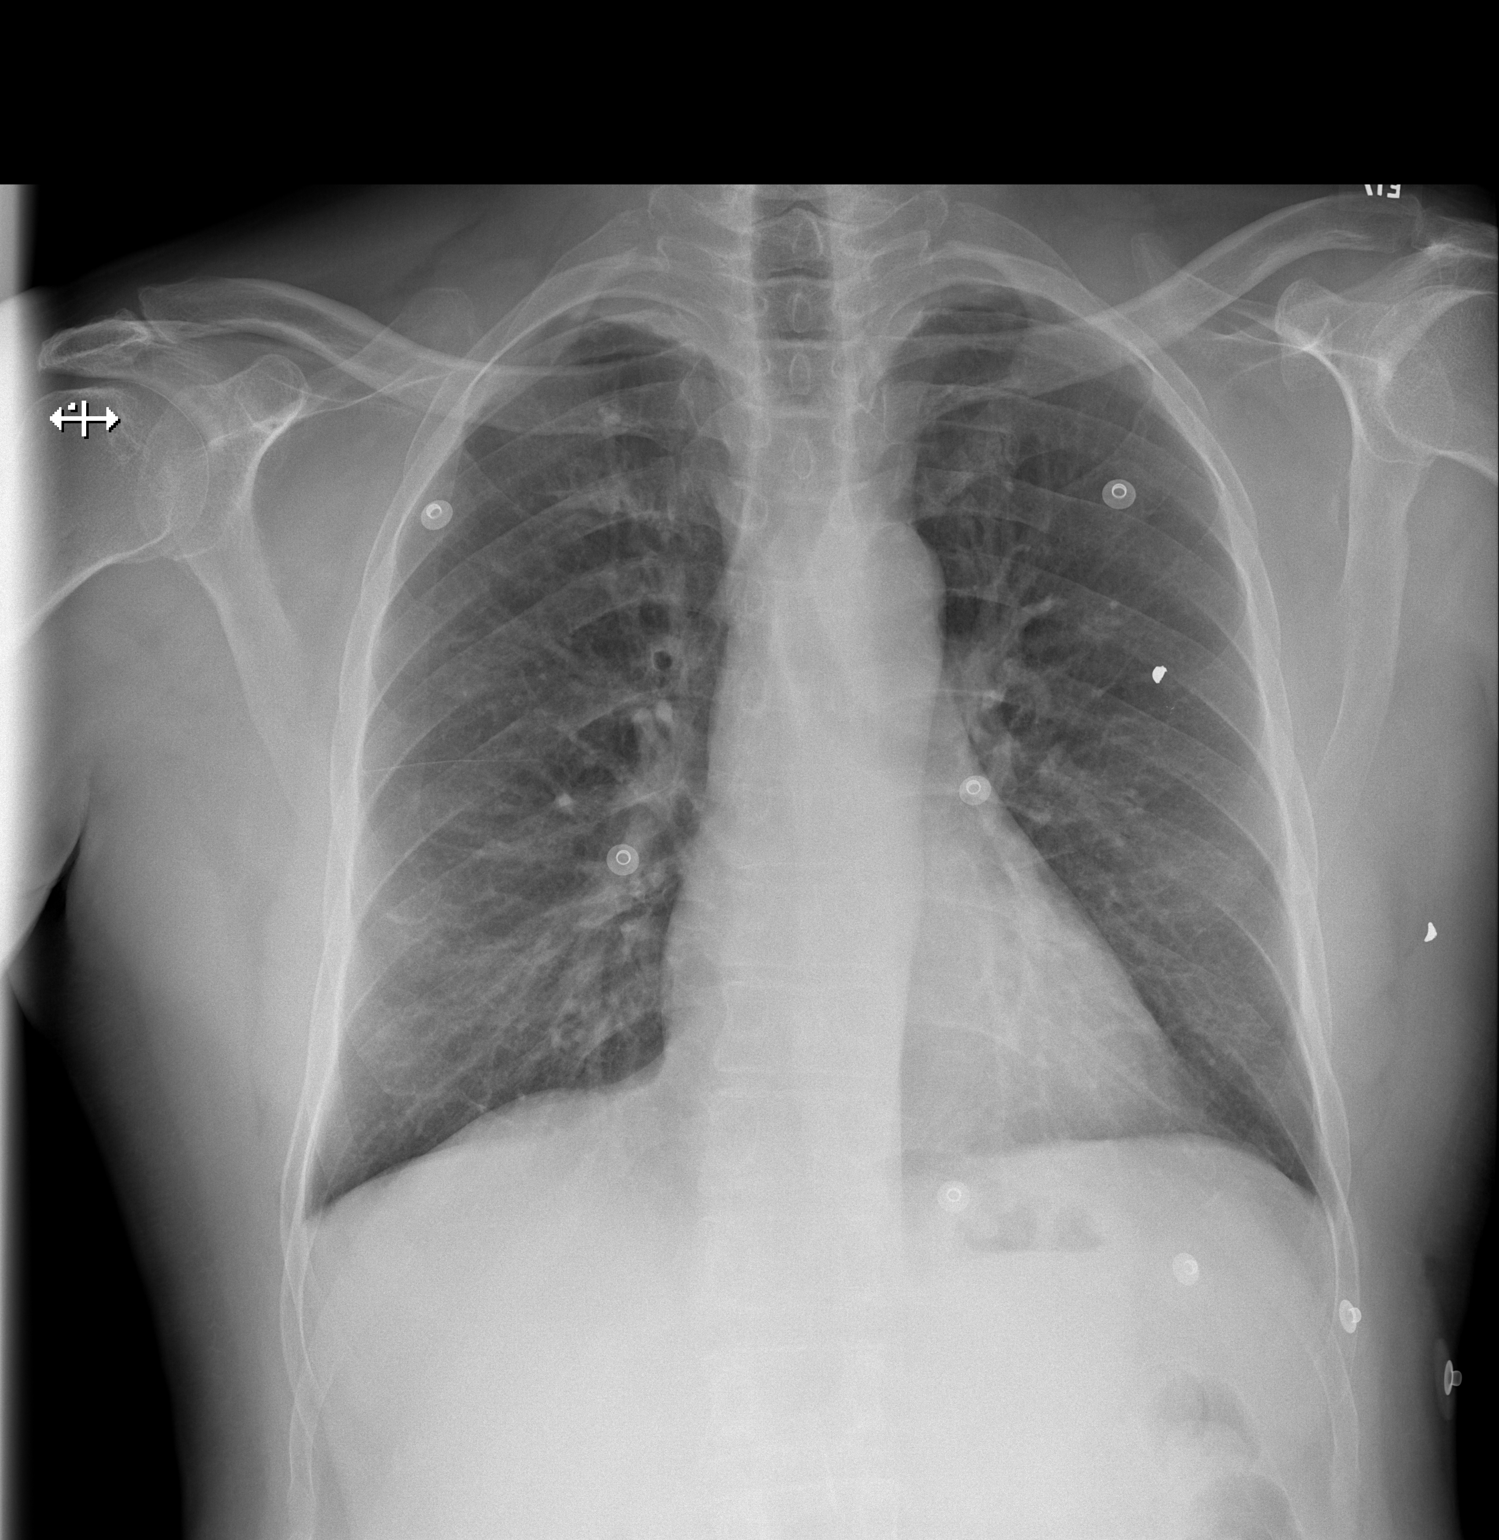

[1 of 1 positions shown; findings below may reference images not displayed]

FINDINGS: The heart size and mediastinal contours are within normal limits.
Both lungs are clear. The visualized skeletal structures are
unremarkable. Small metallic fragments in the left chest wall.
IMPRESSION: No active disease.

## 2021-04-28 MED ORDER — NITROGLYCERIN 0.4 MG SL SUBL
0.4000 mg | SUBLINGUAL_TABLET | SUBLINGUAL | Status: DC | PRN
  Filled 2021-04-28: qty 1

## 2021-04-28 MED ORDER — ONDANSETRON HCL 4 MG/2ML IJ SOLN: 4.0000 mg | Freq: Four times a day (QID) | INTRAMUSCULAR | Status: DC | PRN

## 2021-04-28 MED ORDER — HEPARIN BOLUS VIA INFUSION
4000.0000 [IU] | Freq: Once | INTRAVENOUS | Status: AC
  Administered 2021-04-28: 4000 [IU] via INTRAVENOUS
  Filled 2021-04-28: qty 4000

## 2021-04-28 MED ORDER — METOPROLOL TARTRATE 25 MG PO TABS
12.5000 mg | ORAL_TABLET | Freq: Two times a day (BID) | ORAL | Status: DC
  Administered 2021-04-28: 12.5 mg via ORAL
  Filled 2021-04-28: qty 1

## 2021-04-28 MED ORDER — AMLODIPINE BESYLATE 10 MG PO TABS
10.0000 mg | ORAL_TABLET | Freq: Every day | ORAL | Status: DC
  Administered 2021-04-29 – 2021-04-30 (×2): 10 mg via ORAL
  Filled 2021-04-28: qty 1
  Filled 2021-04-28: qty 2

## 2021-04-28 MED ORDER — SODIUM CHLORIDE 0.9 % IV BOLUS
1000.0000 mL | Freq: Once | INTRAVENOUS | Status: AC
  Administered 2021-04-28: 1000 mL via INTRAVENOUS

## 2021-04-28 MED ORDER — HEPARIN (PORCINE) 25000 UT/250ML-% IV SOLN
950.0000 [IU]/h | INTRAVENOUS | Status: DC
  Administered 2021-04-28: 950 [IU]/h via INTRAVENOUS
  Filled 2021-04-28: qty 250

## 2021-04-28 MED ORDER — TAMSULOSIN HCL 0.4 MG PO CAPS
0.4000 mg | ORAL_CAPSULE | Freq: Every day | ORAL | Status: DC
  Administered 2021-04-29 – 2021-04-30 (×2): 0.4 mg via ORAL
  Filled 2021-04-28 (×2): qty 1

## 2021-04-28 MED ORDER — ASPIRIN EC 81 MG PO TBEC
81.0000 mg | DELAYED_RELEASE_TABLET | Freq: Every day | ORAL | Status: DC
  Administered 2021-04-29 – 2021-04-30 (×2): 81 mg via ORAL
  Filled 2021-04-28 (×2): qty 1

## 2021-04-28 MED ORDER — ACETAMINOPHEN 325 MG PO TABS: 650.0000 mg | ORAL_TABLET | ORAL | Status: DC | PRN

## 2021-04-28 MED ORDER — ATORVASTATIN CALCIUM 40 MG PO TABS
40.0000 mg | ORAL_TABLET | Freq: Every day | ORAL | Status: DC
  Administered 2021-04-28: 23:00:00 40 mg via ORAL
  Filled 2021-04-28: qty 1

## 2021-04-28 MED ORDER — NITROGLYCERIN 0.4 MG SL SUBL: 0.4000 mg | SUBLINGUAL_TABLET | SUBLINGUAL | Status: DC | PRN

## 2021-04-28 NOTE — ED Provider Triage Note (Signed)
Emergency Medicine Provider Triage Evaluation Note  Jerry Moran , a 66 y.o. male  was evaluated in triage.  Pt complains of left-sided chest pain onset prior to arrival.  He notes that he woke up from a nap and began to have the chest pain.  The area is tender to the area.  It is nonradiating.  However his pain has improved to more of a discomfort sensation.  Has associated shortness of breath.  His chest pain has improved at this time.  He took 324 mg aspirin at home prior to arrival to the ED with no relief in symptoms.  Denies fever, chills, nausea, vomiting, abdominal pain.  Denies past medical history of MI, CAD, diabetes, hypertension, cardiac catheterization, stents.  Review of Systems  Positive: As per HPI above. Negative: Fever, chills  Physical Exam  BP (!) 155/83 (BP Location: Left Arm)    Pulse 75    Temp 98 F (36.7 C) (Oral)    Resp 18    SpO2 99%  Gen:   Awake, no distress   Resp:  Normal effort  MSK:   Moves extremities without difficulty  Other:  No chest wall tenderness to palpation  Medical Decision Making  Medically screening exam initiated at 5:53 PM.  Appropriate orders placed.  Nedim Lakey was informed that the remainder of the evaluation will be completed by another provider, this initial triage assessment does not replace that evaluation, and the importance of remaining in the ED until their evaluation is complete.    Mike Hamre A, [name] 04/28/21 1800

## 2021-04-28 NOTE — ED Notes (Signed)
Pt provided with crackers and orange juice

## 2021-04-28 NOTE — H&P (Signed)
Cardiology Admission History and Physical:   Patient ID: Jerry Moran MRN: [id number]; DOB: 10-20-1955   Admission date: 04/28/2021  PCP:  Clinic, [facility]   [facility] Providers Cardiologist:  None        Chief Complaint:  chest pain   Patient Profile:   Jerry Moran is a 66 y.o. male with type 2 diabetes mellitus, hypertension, prostate cancer, recent CVA who is being seen 04/28/2021 for the evaluation of new onset chest pain.  History of Present Illness:   Jerry Moran is a 66 year old gentleman with type 2 diabetes mellitus, hypertension, prostate cancer, and recent CVA who had acute onset left-sided chest pain that started on the evening of 04/28/2021.  Noticed the pain was substernal and nonreproducible.  Was not radiating and did not have any associated symptoms.  He denied nausea, vomiting, abdominal pain, left-sided arm or jaw pain, or any other new symptoms.  He noted some increased fatigability, but habitus is difficult access given his stroke related weakness in his legs.  The pain was constant dull and aching, so he alerted EMS.  On their arrival ECG was unremarkable and he was given 324 mg of aspirin.  He was brought to the emergency department for further evaluation.  The time he had arrived to the emergency department his chest pain had subsided.  He was hypertensive with blood pressures ranging 485I to 627O systolic.  Initial laboratory evaluation in the emergency department revealed a creatinine of 1.8, and a high-sensitivity troponin I 321 with repeat of 311 (delta 10).  At that time he was started on heparin and cardiology was called for consultation and admission.  When I saw him, he was chest pain-free and resting comfortably in bed.   Past Medical History:  Diagnosis Date   Diabetes mellitus without complication ([place])    Hypertension    Prostate cancer ([place])    Stroke [facility])     Past Surgical History:  Procedure Laterality Date    PROSTATE BIOPSY       Medications Prior to Admission: Prior to Admission medications   Medication Sig Start Date End Date Taking? Authorizing Provider  amLODipine (NORVASC) 10 MG tablet Take 1 tablet (10 mg total) by mouth daily. 01/16/19  Yes Hall, Carole N, DO  atorvastatin (LIPITOR) 40 MG tablet Take 40 mg by mouth at bedtime.   Yes [provider]  BAYER LOW DOSE 81 MG EC tablet Take 81 mg by mouth daily. Swallow whole.   Yes [provider]  furosemide (LASIX) 20 MG tablet Take 20 mg by mouth in the morning.   Yes [provider]  glipiZIDE (GLUCOTROL) 5 MG tablet Take 5 mg by mouth 2 (two) times daily before a meal.   Yes [provider]  lisinopril (ZESTRIL) 40 MG tablet Take 40 mg by mouth every morning. 08/10/20  Yes [provider]  metFORMIN (GLUCOPHAGE) 1000 MG tablet Take 1,000 mg by mouth 2 (two) times daily with a meal.   Yes [provider]  tamsulosin (FLOMAX) 0.4 MG CAPS capsule Take 0.4 mg by mouth daily.   Yes [provider]  atorvastatin (LIPITOR) 80 MG tablet Take 1 tablet (80 mg total) by mouth daily at 6 PM. Patient not taking: Reported on 04/28/2021 01/16/19 05/09/19  Kayleen Memos, DO  blood glucose meter kit and supplies KIT Dispense based on patient and insurance preference. Use up to four times daily as directed. (FOR ICD-9 250.00, 250.01). 01/16/19   Kayleen Memos,  DO  metFORMIN (GLUCOPHAGE) 500 MG tablet Take 2 tablets (1,000 mg total) by mouth 2 (two) times daily with a meal. Patient not taking: Reported on 04/28/2021 01/16/19 04/28/21  Kayleen Memos, DO     Allergies:   No Known Allergies  Social History:   Social History   Socioeconomic History   Marital status: Married    Spouse name: Almyra Free   Number of children: Not on file   Years of education: Not on file   Highest education level: Not on file  Occupational History   Not on file  Tobacco Use   Smoking status: Never   Smokeless  tobacco: Never  Vaping Use   Vaping Use: Never used  Substance and Sexual Activity   Alcohol use: Never   Drug use: Never   Sexual activity: Not Currently  Other Topics Concern   Not on file  Social History Narrative   Not on file   Social Determinants of Health   Financial Resource Strain: Not on file  Food Insecurity: Not on file  Transportation Needs: Not on file  Physical Activity: Not on file  Stress: Not on file  Social Connections: Not on file  Intimate Partner Violence: Not on file    Family History: Noncontributory The patient's family history includes Breast cancer in his mother. There is no history of Colon cancer, Pancreatic cancer, or Prostate cancer.    ROS:  Please see the history of present illness.  All other ROS reviewed and negative.     Physical Exam/Data:   Vitals:   04/28/21 2030 04/28/21 2100 04/28/21 2237 04/28/21 2241  BP: (!) 146/96 (!) 173/88 (!) 166/85   Pulse: 76 71 87   Resp: 15 17    Temp:      TempSrc:      SpO2: 99% 98%  98%  Weight:      Height:        Intake/Output Summary (Last 24 hours) at 04/28/2021 2305 Last data filed at 04/28/2021 2143 Gross per 24 hour  Intake 1000 ml  Output --  Net 1000 ml   Last 3 Weights 04/28/2021 04/01/2021 09/02/2020  Weight (lbs) 175 lb 185 lb 173 lb 4 oz  Weight (kg) 79.379 kg 83.915 kg 78.586 kg     Body mass index is 26.61 kg/m.  General:  Well nourished, well developed, in no acute distress. HEENT: normal Neck: no JVD Vascular: No carotid bruits; Distal pulses 2+ bilaterally   Cardiac:  normal S1, S2; RRR; no murmur  Lungs:  clear to auscultation bilaterally, no wheezing, rhonchi or rales  Abd: soft, nontender, no hepatomegaly  Ext: Trace to 1+ bilateral pitting edema edema Musculoskeletal:  No deformities, BUE and BLE strength normal and equal Skin: warm and dry  Neuro:  CNs 2-12 intact, left sided dropfoot  psych:  Normal affect    EKG:  The ECG that was done unremarkable for  ischemic changes.  Did have a right bundle branch block.  This was personally reviewed   Relevant CV Studies: Echo in 2020 1. Left ventricular ejection fraction, by visual estimation, is 60 to  65%. The left ventricle has normal function. Normal left ventricular size.  There is mildly increased left ventricular hypertrophy.   2. Global right ventricle has normal systolic function.The right  ventricular size is normal. No increase in right ventricular wall  thickness.   3. Left atrial size was normal.   4. Right atrial size was normal.   5. Trivial pericardial  effusion is present.   6. The mitral valve is normal in structure. No evidence of mitral valve  regurgitation.   7. The tricuspid valve is normal in structure. Tricuspid valve  regurgitation is trivial.   8. The aortic valve is tricuspid Aortic valve regurgitation was not  visualized by color flow Doppler.   9. The pulmonic valve was grossly normal. Pulmonic valve regurgitation is  trivial by color flow Doppler.  10. The inferior vena cava is normal in size with greater than 50%  respiratory variability, suggesting right atrial pressure of 3 mmHg.  Laboratory Data:  High Sensitivity Troponin:   Recent Labs  Lab 04/28/21 1759 04/28/21 1945  TROPONINIHS 321* 311*      Chemistry Recent Labs  Lab 04/28/21 1759  NA 139  K 3.5  CL 104  CO2 25  GLUCOSE 258*  BUN 16  CREATININE 1.82*  CALCIUM 8.8*  GFRNONAA 40*  ANIONGAP 10    No results for input(s): PROT, ALBUMIN, AST, ALT, ALKPHOS, BILITOT in the last 168 hours. Lipids No results for input(s): CHOL, TRIG, HDL, LABVLDL, LDLCALC, CHOLHDL in the last 168 hours. Hematology Recent Labs  Lab 04/28/21 1759  WBC 5.0  RBC 3.73*  HGB 11.4*  HCT 34.3*  MCV 92.0  MCH 30.6  MCHC 33.2  RDW 12.8  PLT 217   Thyroid No results for input(s): TSH, FREET4 in the last 168 hours. BNPNo results for input(s): BNP, PROBNP in the last 168 hours.  DDimer No results for  input(s): DDIMER in the last 168 hours.   Radiology/Studies:  DG Chest 1 View  Result Date: 04/28/2021 CLINICAL DATA:  Chest pain EXAM: CHEST  1 VIEW COMPARISON:  None. FINDINGS: The heart size and mediastinal contours are within normal limits. Both lungs are clear. The visualized skeletal structures are unremarkable. Small metallic fragments in the left chest wall. IMPRESSION: No active disease. Electronically Signed   By: Donavan Foil M.D.   On: 04/28/2021 18:31     Assessment and Plan:   NSTEMI.  His story is moderately concerning for ischemia and he has risk factors.  He has not had A1c checked in quite a while and his blood pressure was elevated on arrival along with elevated creatinine.  Given this and his elevated biomarkers on arrival, I agreed with and continued heparin infusion at this time.  I do think that he warrants coronary evaluation and he can get a cardiac catheterization tomorrow.  We will make him n.p.o. at midnight. Meds: 81 mg daily, atorvastatin 40 mg nightly, start metoprolol 12.5 mg twice daily, continue amlodipine 10, hold lisinopril prior to catheterization, nitro SL as needed Evaluation: Lipid panel, hemoglobin A1c, echo ordered N.p.o. after midnight for possible cardiac catheterization Type 2 diabetes mellitus.  Holding home oral antihyperglycemic's.  We will monitor for now with sliding scale insulin and Accu-Cheks. Hypertension.  Starting metoprolol and continue amlodipine.  Holding lisinopril prior to cath but will restart shortly after.  He may need an additional agent but will let metoprolol kick in first. Likely chronic kidney disease.  Suspect this is related to hypertension and diabetes.  His last creatinine check was in 2021 and it was 1.14.  We will continue to monitor renal adjust any medications needed.   Risk Assessment/Risk Scores:    TIMI Risk Score for Unstable Angina or Non-ST Elevation MI:   The patient's TIMI risk score is 4, which indicates a  20% risk of all cause mortality, new or recurrent myocardial infarction  or need for urgent revascularization in the next 14 days.       Severity of Illness: The appropriate patient status for this patient is INPATIENT. Inpatient status is judged to be reasonable and necessary in order to provide the required intensity of service to ensure the patient's safety. The patient's presenting symptoms, physical exam findings, and initial radiographic and laboratory data in the context of their chronic comorbidities is felt to place them at high risk for further clinical deterioration. Furthermore, it is not anticipated that the patient will be medically stable for discharge from the hospital within 2 midnights of admission.   * I certify that at the point of admission it is my clinical judgment that the patient will require inpatient hospital care spanning beyond 2 midnights from the point of admission due to high intensity of service, high risk for further deterioration and high frequency of surveillance required.*   For questions or updates, please contact [place] Please consult [email or web address] for contact info under     Signed, Doyne Keel, MD  04/28/2021 11:05 PM

## 2021-04-28 NOTE — Progress Notes (Signed)
ANTICOAGULATION CONSULT NOTE - Initial Consult  Pharmacy Consult for IV heparin  Indication: chest pain/ACS  No Known Allergies  Patient Measurements: Height: 5\' 8"  (172.7 cm) Weight: 79.4 kg (175 lb) IBW/kg (Calculated) : 68.4 Heparin Dosing Weight: 79.4 kg  Vital Signs: Temp: 98 F (36.7 C) (01/26 1742) Temp Source: Oral (01/26 1742) BP: 156/99 (01/26 1930) Pulse Rate: 78 (01/26 1930)  Labs: Recent Labs    04/28/21 1759  HGB 11.4*  HCT 34.3*  PLT 217  CREATININE 1.82*  TROPONINIHS 321*    Estimated Creatinine Clearance: 38.1 mL/min (A) (by C-G formula based on SCr of 1.82 mg/dL (H)).   Medical History: Past Medical History:  Diagnosis Date   Diabetes mellitus without complication ([place])    Hypertension    Prostate cancer ([place])    Stroke [facility])    Assessment: 20 YOM presenting with chest pain, tender to palpitation with shortness of breath. No history of anticoagulation prior to admission. Pharmacy to dise IV heparin.   Hgb slightly decreased at 11.4, platelets are within normal limits. Renal function slightly elevated, however baseline is not known.   Goal of Therapy:  Heparin level 0.3-0.7 units/ml Monitor platelets by anticoagulation protocol: Yes   Plan:  IV heparin 4000 unit bolus x 1 Start IV heparin gtt @ 950 units/h 6h heparin level check Daily heparin level, CBC Monitor for signs and symptoms of bleeding  Thank you for involving pharmacy in this patient's care.  Elita Quick, PharmD PGY1 Ambulatory Care Pharmacy Resident 04/28/2021 8:16 PM  **Pharmacist phone directory can be found on [email or web address] listed under [place]**

## 2021-04-28 NOTE — ED Triage Notes (Signed)
Pt here w/ [facility] from home, pt woke up from a nap a few hours ago w/ chest pain on the L send, tender to palpation, non-radiating, has slightly improved to a discomfort. Pt endorses shob, no N/V/D. Hx strokes. EKG showed frequent PACs. Pt took 324mg  ASA at home. 174/88, 80HR, 98% RA

## 2021-04-29 ENCOUNTER — Inpatient Hospital Stay (HOSPITAL_COMMUNITY): Payer: No Typology Code available for payment source

## 2021-04-29 ENCOUNTER — Encounter (HOSPITAL_COMMUNITY): Payer: Self-pay | Admitting: Internal Medicine

## 2021-04-29 ENCOUNTER — Encounter (HOSPITAL_COMMUNITY)
Admission: EM | Disposition: A | Discharge status: Home / Self Care | Payer: Self-pay | Source: Home / Self Care | Attending: Cardiology

## 2021-04-29 ENCOUNTER — Other Ambulatory Visit (HOSPITAL_COMMUNITY): Payer: No Typology Code available for payment source

## 2021-04-29 ENCOUNTER — Other Ambulatory Visit (HOSPITAL_COMMUNITY): Payer: Self-pay

## 2021-04-29 DIAGNOSIS — N1832 Chronic kidney disease, stage 3b: Secondary | ICD-10-CM | POA: Diagnosis not present

## 2021-04-29 DIAGNOSIS — I219 Acute myocardial infarction, unspecified: Secondary | ICD-10-CM | POA: Diagnosis not present

## 2021-04-29 DIAGNOSIS — I2510 Atherosclerotic heart disease of native coronary artery without angina pectoris: Secondary | ICD-10-CM

## 2021-04-29 DIAGNOSIS — I214 Non-ST elevation (NSTEMI) myocardial infarction: Secondary | ICD-10-CM | POA: Diagnosis not present

## 2021-04-29 DIAGNOSIS — E785 Hyperlipidemia, unspecified: Secondary | ICD-10-CM | POA: Diagnosis not present

## 2021-04-29 DIAGNOSIS — I10 Essential (primary) hypertension: Secondary | ICD-10-CM | POA: Diagnosis not present

## 2021-04-29 HISTORY — PX: INTRAVASCULAR PRESSURE WIRE/FFR STUDY: CATH118243

## 2021-04-29 HISTORY — PX: CORONARY STENT INTERVENTION: CATH118234

## 2021-04-29 HISTORY — PX: LEFT HEART CATH AND CORONARY ANGIOGRAPHY: CATH118249

## 2021-04-29 LAB — ECHOCARDIOGRAM COMPLETE
Area-P 1/2: 4.8 cm2
Height: 68 in
MV M vel: 5.09 m/s
MV Peak grad: 103.6 mmHg
S' Lateral: 3.4 cm
Weight: 2800 oz

## 2021-04-29 LAB — LIPID PANEL
Cholesterol: 148 mg/dL (ref 0–200)
HDL: 40 mg/dL — ABNORMAL LOW (ref >40)
LDL Cholesterol: 91 mg/dL (ref 0–99)
Total CHOL/HDL Ratio: 3.7 RATIO
Triglycerides: 83 mg/dL (ref <150)
VLDL: 17 mg/dL (ref 0–40)

## 2021-04-29 LAB — BASIC METABOLIC PANEL
Anion gap: 8 (ref 5–15)
BUN: 11 mg/dL (ref 8–23)
CO2: 25 mmol/L (ref 22–32)
Calcium: 8.6 mg/dL — ABNORMAL LOW (ref 8.9–10.3)
Chloride: 104 mmol/L (ref 98–111)
Creatinine, Ser: 1.54 mg/dL — ABNORMAL HIGH (ref 0.61–1.24)
GFR, Estimated: 49 mL/min — ABNORMAL LOW (ref >60)
Glucose, Bld: 219 mg/dL — ABNORMAL HIGH (ref 70–99)
Potassium: 3.4 mmol/L — ABNORMAL LOW (ref 3.5–5.1)
Sodium: 137 mmol/L (ref 135–145)

## 2021-04-29 LAB — BRAIN NATRIURETIC PEPTIDE: B Natriuretic Peptide: 102.1 pg/mL — ABNORMAL HIGH (ref 0.0–100.0)

## 2021-04-29 LAB — HEMOGLOBIN A1C
Hgb A1c MFr Bld: 6 % — ABNORMAL HIGH (ref 4.8–5.6)
Mean Plasma Glucose: 125.5 mg/dL

## 2021-04-29 LAB — HEPARIN LEVEL (UNFRACTIONATED): Heparin Unfractionated: 0.43 IU/mL (ref 0.30–0.70)

## 2021-04-29 LAB — CBC
HCT: 30.3 % — ABNORMAL LOW (ref 39.0–52.0)
Hemoglobin: 10.4 g/dL — ABNORMAL LOW (ref 13.0–17.0)
MCH: 31.3 pg (ref 26.0–34.0)
MCHC: 34.3 g/dL (ref 30.0–36.0)
MCV: 91.3 fL (ref 80.0–100.0)
Platelets: 194 10*3/uL (ref 150–400)
RBC: 3.32 MIL/uL — ABNORMAL LOW (ref 4.22–5.81)
RDW: 12.6 % (ref 11.5–15.5)
WBC: 5.1 10*3/uL (ref 4.0–10.5)
nRBC: 0 % (ref 0.0–0.2)

## 2021-04-29 LAB — GLUCOSE, CAPILLARY
Glucose-Capillary: 133 mg/dL — ABNORMAL HIGH (ref 70–99)
Glucose-Capillary: 273 mg/dL — ABNORMAL HIGH (ref 70–99)
Glucose-Capillary: 152 mg/dL — ABNORMAL HIGH (ref 70–99)

## 2021-04-29 LAB — PROTIME-INR
INR: 1 (ref 0.8–1.2)
Prothrombin Time: 13.3 seconds (ref 11.4–15.2)

## 2021-04-29 LAB — HIV ANTIBODY (ROUTINE TESTING W REFLEX): HIV Screen 4th Generation wRfx: NONREACTIVE

## 2021-04-29 LAB — POCT ACTIVATED CLOTTING TIME: Activated Clotting Time: 305 seconds

## 2021-04-29 SURGERY — LEFT HEART CATH AND CORONARY ANGIOGRAPHY
Anesthesia: LOCAL

## 2021-04-29 MED ORDER — VERAPAMIL HCL 2.5 MG/ML IV SOLN
INTRAVENOUS | Status: AC
  Filled 2021-04-29: qty 2

## 2021-04-29 MED ORDER — ASPIRIN 81 MG PO CHEW: 81.0000 mg | CHEWABLE_TABLET | ORAL | Status: DC

## 2021-04-29 MED ORDER — ASPIRIN 81 MG PO CHEW: 81.0000 mg | CHEWABLE_TABLET | Freq: Every day | ORAL | Status: DC

## 2021-04-29 MED ORDER — SODIUM CHLORIDE 0.9 % IV SOLN: 250.0000 mL | INTRAVENOUS | Status: DC | PRN

## 2021-04-29 MED ORDER — FENTANYL CITRATE (PF) 100 MCG/2ML IJ SOLN
INTRAMUSCULAR | Status: AC
  Filled 2021-04-29: qty 2

## 2021-04-29 MED ORDER — FENTANYL CITRATE (PF) 100 MCG/2ML IJ SOLN
INTRAMUSCULAR | Status: DC | PRN
  Administered 2021-04-29: 50 ug via INTRAVENOUS

## 2021-04-29 MED ORDER — LIDOCAINE HCL (PF) 1 % IJ SOLN
INTRAMUSCULAR | Status: AC
  Filled 2021-04-29: qty 30

## 2021-04-29 MED ORDER — SODIUM CHLORIDE 0.9 % WEIGHT BASED INFUSION
3.0000 mL/kg/h | INTRAVENOUS | Status: DC
  Administered 2021-04-29: 3 mL/kg/h via INTRAVENOUS

## 2021-04-29 MED ORDER — TICAGRELOR 90 MG PO TABS
ORAL_TABLET | ORAL | Status: DC | PRN
  Administered 2021-04-29: 180 mg via ORAL

## 2021-04-29 MED ORDER — SODIUM CHLORIDE 0.9% FLUSH
3.0000 mL | Freq: Two times a day (BID) | INTRAVENOUS | Status: DC
  Administered 2021-04-29 (×2): 3 mL via INTRAVENOUS

## 2021-04-29 MED ORDER — TICAGRELOR 90 MG PO TABS
ORAL_TABLET | ORAL | Status: AC
  Filled 2021-04-29: qty 1

## 2021-04-29 MED ORDER — VERAPAMIL HCL 2.5 MG/ML IV SOLN
INTRAVENOUS | Status: DC | PRN
  Administered 2021-04-29: 10 mL via INTRA_ARTERIAL

## 2021-04-29 MED ORDER — HEPARIN (PORCINE) IN NACL 1000-0.9 UT/500ML-% IV SOLN
INTRAVENOUS | Status: AC
  Filled 2021-04-29: qty 1000

## 2021-04-29 MED ORDER — HYDRALAZINE HCL 20 MG/ML IJ SOLN: 10.0000 mg | INTRAMUSCULAR | Status: AC | PRN

## 2021-04-29 MED ORDER — MIDAZOLAM HCL 2 MG/2ML IJ SOLN
INTRAMUSCULAR | Status: DC | PRN
  Administered 2021-04-29: 2 mg via INTRAVENOUS

## 2021-04-29 MED ORDER — SODIUM CHLORIDE 0.9% FLUSH: 3.0000 mL | INTRAVENOUS | Status: DC | PRN

## 2021-04-29 MED ORDER — LABETALOL HCL 5 MG/ML IV SOLN: 10.0000 mg | INTRAVENOUS | Status: AC | PRN

## 2021-04-29 MED ORDER — SODIUM CHLORIDE 0.9% FLUSH
3.0000 mL | Freq: Two times a day (BID) | INTRAVENOUS | Status: DC
  Administered 2021-04-29 – 2021-04-30 (×2): 3 mL via INTRAVENOUS

## 2021-04-29 MED ORDER — HEPARIN (PORCINE) IN NACL 1000-0.9 UT/500ML-% IV SOLN
INTRAVENOUS | Status: DC | PRN
  Administered 2021-04-29 (×2): 500 mL

## 2021-04-29 MED ORDER — SODIUM CHLORIDE 0.9 % IV SOLN: INTRAVENOUS | Status: AC

## 2021-04-29 MED ORDER — METOPROLOL TARTRATE 25 MG PO TABS
25.0000 mg | ORAL_TABLET | Freq: Two times a day (BID) | ORAL | Status: DC
  Administered 2021-04-29 – 2021-04-30 (×3): 25 mg via ORAL
  Filled 2021-04-29 (×3): qty 1

## 2021-04-29 MED ORDER — ATORVASTATIN CALCIUM 80 MG PO TABS
80.0000 mg | ORAL_TABLET | Freq: Every day | ORAL | Status: DC
  Administered 2021-04-29: 80 mg via ORAL
  Filled 2021-04-29: qty 1

## 2021-04-29 MED ORDER — TICAGRELOR 90 MG PO TABS
90.0000 mg | ORAL_TABLET | Freq: Two times a day (BID) | ORAL | Status: DC
  Administered 2021-04-29 – 2021-04-30 (×2): 90 mg via ORAL
  Filled 2021-04-29 (×2): qty 1

## 2021-04-29 MED ORDER — LIDOCAINE HCL (PF) 1 % IJ SOLN
INTRAMUSCULAR | Status: DC | PRN
  Administered 2021-04-29: 1 mL

## 2021-04-29 MED ORDER — HEPARIN SODIUM (PORCINE) 1000 UNIT/ML IJ SOLN
INTRAMUSCULAR | Status: AC
  Filled 2021-04-29: qty 10

## 2021-04-29 MED ORDER — IOHEXOL 350 MG/ML SOLN
INTRAVENOUS | Status: DC | PRN
  Administered 2021-04-29: 105 mL

## 2021-04-29 MED ORDER — POTASSIUM CHLORIDE CRYS ER 20 MEQ PO TBCR
40.0000 meq | EXTENDED_RELEASE_TABLET | Freq: Once | ORAL | Status: AC
  Administered 2021-04-29: 40 meq via ORAL
  Filled 2021-04-29: qty 2

## 2021-04-29 MED ORDER — SODIUM CHLORIDE 0.9 % WEIGHT BASED INFUSION: 80.0000 mL/h | INTRAVENOUS | Status: DC

## 2021-04-29 MED ORDER — HEPARIN SODIUM (PORCINE) 1000 UNIT/ML IJ SOLN
INTRAMUSCULAR | Status: DC | PRN
  Administered 2021-04-29: 4000 [IU] via INTRAVENOUS
  Administered 2021-04-29: 6000 [IU] via INTRAVENOUS

## 2021-04-29 MED ORDER — MIDAZOLAM HCL 2 MG/2ML IJ SOLN
INTRAMUSCULAR | Status: AC
  Filled 2021-04-29: qty 2

## 2021-04-29 SURGICAL SUPPLY — 20 items
BALLN SAPPHIRE 2.0X12 (BALLOONS) ×2
BALLN ~~LOC~~ EUPHORA RX 2.75X8 (BALLOONS) ×2
BALLOON SAPPHIRE 2.0X12 (BALLOONS) IMPLANT
BALLOON ~~LOC~~ EUPHORA RX 2.75X8 (BALLOONS) IMPLANT
CATH 5FR JL3.5 JR4 ANG PIG MP (CATHETERS) ×1 IMPLANT
CATH LAUNCHER 6FR AL.75 (CATHETERS) ×1 IMPLANT
CATH VISTA GUIDE 6FR XBLAD3.5 (CATHETERS) ×1 IMPLANT
DEVICE RAD COMP TR BAND LRG (VASCULAR PRODUCTS) ×1 IMPLANT
GLIDESHEATH SLEND SS 6F .021 (SHEATH) ×1 IMPLANT
GUIDEWIRE INQWIRE 1.5J.035X260 (WIRE) IMPLANT
GUIDEWIRE PRESSURE X 175 (WIRE) ×1 IMPLANT
INQWIRE 1.5J .035X260CM (WIRE) ×2
KIT ENCORE 26 ADVANTAGE (KITS) ×1 IMPLANT
KIT ESSENTIALS PG (KITS) ×1 IMPLANT
KIT HEART LEFT (KITS) ×2 IMPLANT
PACK CARDIAC CATHETERIZATION (CUSTOM PROCEDURE TRAY) ×2 IMPLANT
STENT ONYX FRONTIER 2.5X12 (Permanent Stent) ×1 IMPLANT
TRANSDUCER W/STOPCOCK (MISCELLANEOUS) ×2 IMPLANT
TUBING CIL FLEX 10 FLL-RA (TUBING) ×2 IMPLANT
WIRE COUGAR XT STRL 190CM (WIRE) ×1 IMPLANT

## 2021-04-29 NOTE — Progress Notes (Signed)
°  Echocardiogram 2D Echocardiogram has been performed.  Jerry Moran 04/29/2021, 5:15 PM

## 2021-04-29 NOTE — Care Management (Signed)
04-29-21 Case Manager called the [facility] notification number. [place] Notification ID: [phone]. Patient is a member of the [employer] is Dr. Ishmael Holter. Patient uses the [facility] for medications and he uses [facility] locally. Case Manager will continue to follow for additional transition of care needs.

## 2021-04-29 NOTE — ED Provider Notes (Signed)
[facility] EMERGENCY DEPARTMENT Provider Note   CSN: [id number] Arrival date & time: 04/28/21  1717     History  Chief Complaint  Patient presents with   Chest Pain    Cortlan Dolin is a 66 y.o. male This is a 66 year old man with a past medical history significant for hypertension, hypercholesterolemia, previous stroke, diabetes who presents with chest pain for last 2 to 3 hours. patient reports that woke from a nap with left-sided chest pain that was not radiating, no nausea, vomiting, diaphoresis.  Not significantly worse with exertion, resolved after a few minutes.  Patient denies history of same, no history of ACS in the past.  No first-degree family history of ACS.  No tobacco use, no drug use.  Patient denies active chest pain at this time.  Patient has not had stress test in the past.  He did have an echo around 3 years ago after stroke which was unremarkable.   Chest Pain     Home Medications Prior to Admission medications   Medication Sig Start Date End Date Taking? Authorizing Provider  amLODipine (NORVASC) 10 MG tablet Take 1 tablet (10 mg total) by mouth daily. 01/16/19  Yes Hall, Carole N, DO  atorvastatin (LIPITOR) 40 MG tablet Take 40 mg by mouth at bedtime.   Yes [provider]  BAYER LOW DOSE 81 MG EC tablet Take 81 mg by mouth daily. Swallow whole.   Yes [provider]  furosemide (LASIX) 20 MG tablet Take 20 mg by mouth in the morning.   Yes [provider]  glipiZIDE (GLUCOTROL) 5 MG tablet Take 5 mg by mouth 2 (two) times daily before a meal.   Yes [provider]  lisinopril (ZESTRIL) 40 MG tablet Take 40 mg by mouth every morning. 08/10/20  Yes [provider]  metFORMIN (GLUCOPHAGE) 1000 MG tablet Take 1,000 mg by mouth 2 (two) times daily with a meal.   Yes [provider]  tamsulosin (FLOMAX) 0.4 MG CAPS capsule Take 0.4 mg by mouth daily.   Yes [provider]   atorvastatin (LIPITOR) 80 MG tablet Take 1 tablet (80 mg total) by mouth daily at 6 PM. Patient not taking: Reported on 04/28/2021 01/16/19 05/09/19  Kayleen Memos, DO  blood glucose meter kit and supplies KIT Dispense based on patient and insurance preference. Use up to four times daily as directed. (FOR ICD-9 250.00, 250.01). 01/16/19   Kayleen Memos, DO  metFORMIN (GLUCOPHAGE) 500 MG tablet Take 2 tablets (1,000 mg total) by mouth 2 (two) times daily with a meal. Patient not taking: Reported on 04/28/2021 01/16/19 04/28/21  Kayleen Memos, DO      Allergies    Patient has no known allergies.    Review of Systems   Review of Systems  Cardiovascular:  Positive for chest pain.  All other systems reviewed and are negative.  Physical Exam Updated Vital Signs BP (!) 156/90    Pulse 77    Temp 98 F (36.7 C) (Oral)    Resp 16    Ht 5' 8"  (1.727 m)    Wt 79.4 kg    SpO2 96%    BMI 26.61 kg/m  Physical Exam Vitals and nursing note reviewed.  Constitutional:      General: He is not in acute distress.    Appearance: Normal appearance.  HENT:     Head: Normocephalic and atraumatic.  Eyes:     General:  Right eye: No discharge.        Left eye: No discharge.  Cardiovascular:     Rate and Rhythm: Normal rate and regular rhythm.     Heart sounds: No murmur heard.   No friction rub. No gallop.     Comments: Normal heart rate and rhythm. Pulmonary:     Effort: Pulmonary effort is normal.     Breath sounds: Normal breath sounds.  Chest:     Comments: No tenderness palpation of the chest wall. Abdominal:     General: Bowel sounds are normal.     Palpations: Abdomen is soft.     Comments: No significant tenderness to palpation of the abdomen.  Skin:    General: Skin is warm and dry.     Capillary Refill: Capillary refill takes less than 2 seconds.  Neurological:     Mental Status: He is alert and oriented to person, place, and time.  Psychiatric:        Mood and Affect: Mood  normal.        Behavior: Behavior normal.    ED Results / Procedures / Treatments   Labs (all labs ordered are listed, but only abnormal results are displayed) Labs Reviewed  BASIC METABOLIC PANEL - Abnormal; Notable for the following components:      Result Value   Glucose, Bld 258 (*)    Creatinine, Ser 1.82 (*)    Calcium 8.8 (*)    GFR, Estimated 40 (*)    All other components within normal limits  CBC - Abnormal; Notable for the following components:   RBC 3.73 (*)    Hemoglobin 11.4 (*)    HCT 34.3 (*)    All other components within normal limits  TROPONIN I (HIGH SENSITIVITY) - Abnormal; Notable for the following components:   Troponin I (High Sensitivity) 321 (*)    All other components within normal limits  TROPONIN I (HIGH SENSITIVITY) - Abnormal; Notable for the following components:   Troponin I (High Sensitivity) 311 (*)    All other components within normal limits  RESP PANEL BY RT-PCR (FLU A&B, COVID) ARPGX2  HEPARIN LEVEL (UNFRACTIONATED)  CBC  HIV ANTIBODY (ROUTINE TESTING W REFLEX)  HEMOGLOBIN A1C  BRAIN NATRIURETIC PEPTIDE  BASIC METABOLIC PANEL  LIPID PANEL  PROTIME-INR    EKG EKG Interpretation  Date/Time:  Thursday April 28 2021 17:45:48 EST Ventricular Rate:  81 PR Interval:  156 QRS Duration: 128 QT Interval:  414 QTC Calculation: 480 R Axis:   97 Text Interpretation: Sinus rhythm with Premature supraventricular complexes Right bundle branch block Abnormal ECG When compared with ECG of 22-Jan-2019 11:03, PREVIOUS ECG IS PRESENT No sig change from Oct 21 20220 ecg, no STEMI Confirmed by Octaviano Glow [phone]) on 04/28/2021 7:18:34 PM  Radiology DG Chest 1 View  Result Date: 04/28/2021 CLINICAL DATA:  Chest pain EXAM: CHEST  1 VIEW COMPARISON:  None. FINDINGS: The heart size and mediastinal contours are within normal limits. Both lungs are clear. The visualized skeletal structures are unremarkable. Small metallic fragments in the left chest  wall. IMPRESSION: No active disease. Electronically Signed   By: Donavan Foil M.D.   On: 04/28/2021 18:31    Procedures Procedures    Medications Ordered in ED Medications  nitroGLYCERIN (NITROSTAT) SL tablet 0.4 mg (has no administration in time range)  heparin ADULT infusion 100 units/mL (25000 units/23m) (950 Units/hr Intravenous New Bag/Given 04/28/21 2015)  aspirin EC tablet 81 mg (has no administration in time  range)  amLODipine (NORVASC) tablet 10 mg (has no administration in time range)  atorvastatin (LIPITOR) tablet 40 mg (40 mg Oral Given 04/28/21 2237)  tamsulosin (FLOMAX) capsule 0.4 mg (has no administration in time range)  nitroGLYCERIN (NITROSTAT) SL tablet 0.4 mg (has no administration in time range)  acetaminophen (TYLENOL) tablet 650 mg (has no administration in time range)  ondansetron (ZOFRAN) injection 4 mg (has no administration in time range)  metoprolol tartrate (LOPRESSOR) tablet 12.5 mg (12.5 mg Oral Given 04/28/21 2237)  sodium chloride 0.9 % bolus 1,000 mL (0 mLs Intravenous Stopped 04/28/21 2143)  heparin bolus via infusion 4,000 Units (4,000 Units Intravenous Bolus from Bag 04/28/21 2015)    ED Course/ Medical Decision Making/ A&P Clinical Course as of 04/29/21 0102  Thu Apr 28, 2021  2116 This is a 62 male present emerged department chest discomfort that resolved earlier today.  His EKG on arrival shows a sinus rhythm without acute ischemic findings.  His labs are notable for elevated troponin in the 300s range, flat on repeat.  He remains asymptomatic in the ER.  He did report he had some general fatigue with his chest pressure earlier today.  Overall the history could be concerning for acute coronary syndrome.  Cardiology was consulted and recommended initiation of IV heparin and admission to the hospital.  Patient is agreeable with this plan [MT]    Clinical Course User Index [MT] Trifan, Carola Rhine, MD                           Medical Decision  Making Risk Decision regarding hospitalization.   This patient presents to the ED for concern of chest pain, this involves an extensive number of treatment options, and is a complaint that carries with it a high risk of complications and morbidity. The emergent differential diagnosis includes, but is not limited to, ACS, dissection, aneurysm, pericarditis, myocarditis, Boerhaave's, pulmonary embolism, pneumonia, versus other.  This is not an exhaustive differential.   Co morbidities that complicate the patient evaluation: History of TIA, diabetes, stroke, hypertension  Additional history obtained from patient's wife. External records from outside source obtained and reviewed including recent radiation oncology visits for malignant neoplasm of the prostate, as well as PCP visits.  Physical Exam: Physical exam performed. The pertinent findings include: No significant physical exam findings, patient with stable vital signs, no significant tenderness to palpation of the abdomen or chest wall, normal heart rate and rhythm during my evaluation.  Lab Tests: I Ordered, and personally interpreted labs.  The pertinent results include: CBC unremarkable with mild anemia hemoglobin 11.4.  BMP with glucose of 258, creatinine elevated to 1.82.  No recent baseline on file.  We will administer fluid bolus for presumed AKI, although patient does report that he has had decreased kidney function over the last several months since our last value.  Initial troponin 321, delta troponin 311   Imaging Studies: I ordered imaging studies including chest x-ray. I independently visualized and interpreted imaging which showed cardiopulmonary abnormality. I agree with the radiologist interpretation.   Cardiac Monitoring:  The patient was maintained on a cardiac monitor.  My attending physician Dr. Langston Masker viewed and interpreted the cardiac monitored which showed an underlying rhythm of: Sinus rhythm with right bundle branch  block   Medications: I ordered medication including fluid bolus for AKI, and heparin after consultation with cardiology.  Consultations Obtained: I requested consultation with the cardiology team, Billey Chang,  and discussed lab and imaging findings as well as pertinent plan - they recommend: To the cardiology service for presumed NSTEMI with plan to go to the Cath Lab in the morning.   Patient informed of plan and work-up, he is not having active chest pain at time of my evaluation, but we have as needed nitroglycerin available to him.  He is stable for admission at this time.  Final Clinical Impression(s) / ED Diagnoses Final diagnoses:  Chest pain    Rx / DC Orders ED Discharge Orders     None         Dorien Chihuahua 04/29/21 0112    Wyvonnia Dusky, MD 04/29/21 1011

## 2021-04-29 NOTE — Interval H&P Note (Signed)
History and Physical Interval Note:  04/29/2021 11:40 AM  [name]  has presented today for surgery, with the diagnosis of nstemi.  The various methods of treatment have been discussed with the patient and family. After consideration of risks, benefits and other options for treatment, the patient has consented to  Procedure(s): LEFT HEART CATH AND CORONARY ANGIOGRAPHY (N/A) as a surgical intervention.  The patient's history has been reviewed, patient examined, no change in status, stable for surgery.  I have reviewed the patient's chart and labs.  Questions were answered to the patient's satisfaction.    Cath Lab Visit (complete for each Cath Lab visit)  Clinical Evaluation Leading to the Procedure:   ACS: Yes.    Non-ACS:    Anginal Classification: CCS III  Anti-ischemic medical therapy: Minimal Therapy (1 class of medications)  Non-Invasive Test Results: No non-invasive testing performed  Prior CABG: No previous CABG        Lauree Chandler

## 2021-04-29 NOTE — ED Notes (Signed)
Pt asked RN about when he would be discharged today. Pt educated that he was scheduled for a procedure called a cardiac cath to check and see if there was any blockage in his heart. Pt expressed concern about this procedure and states he sis seeing his [facility] doctor next week. RN educated the pt that this procedure is something that they want to do to ensure that his condition gets better, Pt requesting to speak to a doctor about his concerns. MD on chart messaged.

## 2021-04-29 NOTE — TOC Benefit Eligibility Note (Signed)
Patient Teacher, English as a foreign language completed.    The patient is currently admitted and upon discharge could be taking Brilinta 90 mg.  The current 30 day co-pay is, $297.00 due to a $250.00 deductible.  Will be $47.00 once deductible is met.   The patient is insured through [employer], [place] Patient Advocate Specialist [place] Patient Advocate Team Direct Number: [phone]  Fax: [phone]

## 2021-04-29 NOTE — Progress Notes (Signed)
[place] for heparin  Indication: chest pain/ACS Brief A/P: Heparin level within goal range Continue Heparin at current rate   No Known Allergies  Patient Measurements: Height: 5\' 8"  (172.7 cm) Weight: 79.4 kg (175 lb) IBW/kg (Calculated) : 68.4 Heparin Dosing Weight: 79.4 kg  Vital Signs: Temp: 98 F (36.7 C) (01/26 1742) Temp Source: Oral (01/26 1742) BP: 141/96 (01/27 0200) Pulse Rate: 71 (01/27 0200)  Labs: Recent Labs    04/28/21 1759 04/28/21 1945 04/29/21 0213  HGB 11.4*  --  10.4*  HCT 34.3*  --  30.3*  PLT 217  --  194  LABPROT  --   --  13.3  INR  --   --  1.0  HEPARINUNFRC  --   --  0.43  CREATININE 1.82*  --  1.54*  TROPONINIHS 321* 311*  --      Estimated Creatinine Clearance: 45 mL/min (A) (by C-G formula based on SCr of 1.54 mg/dL (H)).  Assessment: 66 y.o. male with chest pain for heparin   Goal of Therapy:  Heparin level 0.3-0.7 units/ml Monitor platelets by anticoagulation protocol: Yes   Plan:  Continue Heparin at current rate  Follow up after cath today   Phillis Knack, PharmD, BCPS

## 2021-04-29 NOTE — H&P (View-Only) (Signed)
The patient has been seen in conjunction with Harlan Stains, NP. All aspects of care have been considered and discussed. The patient has been personally interviewed, examined, and all clinical data has been reviewed.  Non-ST elevation MI in the setting of prior CVA, hypertension, hyperlipidemia, CKD 3, and prediabetes/diabetes. Bilateral radial pulses 2+. EKG reveals right bundle with initial inferior T wave inversion, resolved this morning. Troponins are mildly elevated. Agree with proceeding with coronary angiography to define anatomy and help guide therapy in this high risk patient. He would benefit from the addition of SGLT2 therapy given renal insufficiency. The patient was counseled to undergo left heart catheterization, coronary angiography, and possible percutaneous coronary intervention with stent implantation. The procedural risks and benefits were discussed in detail. The risks discussed included death, stroke, myocardial infarction, life-threatening bleeding, limb ischemia, kidney injury, allergy, and possible emergency cardiac surgery. The risk of these significant complications were estimated to occur less than 1% of the time. After discussion, the patient has agreed to proceed.    Progress Note  Patient Name: Jerry Moran Date of Encounter: 04/29/2021  [facility] Cardiologist: None   Subjective   No chest pain this morning. Long discussion regarding plans for cardiac cath.   Inpatient Medications    Scheduled Meds:  amLODipine  10 mg Oral Daily   aspirin EC  81 mg Oral Daily   atorvastatin  40 mg Oral QHS   metoprolol tartrate  12.5 mg Oral BID   sodium chloride flush  3 mL Intravenous Q12H   tamsulosin  0.4 mg Oral Daily   Continuous Infusions:  heparin 950 Units/hr (04/29/21 0514)   PRN Meds: acetaminophen, nitroGLYCERIN, nitroGLYCERIN, ondansetron (ZOFRAN) IV   Vital Signs    Vitals:   04/29/21 0445 04/29/21 0515 04/29/21 0545 04/29/21 0615   BP: (!) 152/88 (!) 149/87 (!) 158/98 (!) 150/88  Pulse: 65 65 87 70  Resp: 16 16 16 16   Temp:      TempSrc:      SpO2: 97% 96% 96% 95%  Weight:      Height:        Intake/Output Summary (Last 24 hours) at 04/29/2021 0818 Last data filed at 04/28/2021 2143 Gross per 24 hour  Intake 1000 ml  Output --  Net 1000 ml   Last 3 Weights 04/28/2021 04/01/2021 09/02/2020  Weight (lbs) 175 lb 185 lb 173 lb 4 oz  Weight (kg) 79.379 kg 83.915 kg 78.586 kg      Telemetry    SR - Personally Reviewed  ECG    SR, 73 RBBB, LPFB - Personally Reviewed  Physical Exam   GEN: No acute distress.   Neck: No JVD Cardiac: RRR, no murmurs, rubs, or gallops.  Respiratory: Clear to auscultation bilaterally. GI: Soft, nontender, non-distended  MS: Mild bilateral LE edema; No deformity. Neuro:  Nonfocal  Psych: Normal affect   Labs    High Sensitivity Troponin:   Recent Labs  Lab 04/28/21 1759 04/28/21 1945  TROPONINIHS 321* 311*     Chemistry Recent Labs  Lab 04/28/21 1759 04/29/21 0213  NA 139 137  K 3.5 3.4*  CL 104 104  CO2 25 25  GLUCOSE 258* 219*  BUN 16 11  CREATININE 1.82* 1.54*  CALCIUM 8.8* 8.6*  GFRNONAA 40* 49*  ANIONGAP 10 8    Lipids  Recent Labs  Lab 04/29/21 0213  CHOL 148  TRIG 83  HDL 40*  LDLCALC 91  CHOLHDL 3.7    Hematology Recent Labs  Lab 04/28/21 1759 04/29/21 0213  WBC 5.0 5.1  RBC 3.73* 3.32*  HGB 11.4* 10.4*  HCT 34.3* 30.3*  MCV 92.0 91.3  MCH 30.6 31.3  MCHC 33.2 34.3  RDW 12.8 12.6  PLT 217 194   Thyroid No results for input(s): TSH, FREET4 in the last 168 hours.  BNP Recent Labs  Lab 04/29/21 0213  BNP 102.1*    DDimer No results for input(s): DDIMER in the last 168 hours.   Radiology    DG Chest 1 View  Result Date: 04/28/2021 CLINICAL DATA:  Chest pain EXAM: CHEST  1 VIEW COMPARISON:  None. FINDINGS: The heart size and mediastinal contours are within normal limits. Both lungs are clear. The visualized skeletal  structures are unremarkable. Small metallic fragments in the left chest wall. IMPRESSION: No active disease. Electronically Signed   By: Donavan Foil M.D.   On: 04/28/2021 18:31    Cardiac Studies   N/a   Patient Profile     66 y.o. male with DM, HTN, HLD, prostate Ca, CVA who presented to ED with chest pain. Found to have a NSTEMI.   Assessment & Plan    NSTEMI: hsTn peaked 321. Does have concerning RF for CAD. Clinical picture is concerning for ACS. Now on IV heparin, recommendations for cardiac cath. Long discussion but he is agreeable to proceed.  -- continue IV heparin, ASA, statin, BB -- echo pending  Shared Decision Making/Informed Consent{ The risks [stroke (1 in 1000), death (1 in 1000), kidney failure [usually temporary] (1 in 500), bleeding (1 in 200), allergic reaction [possibly serious] (1 in 200)], benefits (diagnostic support and management of coronary artery disease) and alternatives of a cardiac catheterization were discussed in detail with Mr. Hyle and he is willing to proceed.  DM: Hgb A1c 6.0 -- SSI while inpatient  HTN: blood pressures are elevated.  -- amlodipine 10mg  daily, metoprolol 12.5mg  BID-->25mg  BID -- home lisinopril held  HLD: LDL 91 -- atorvastatin 40mg  daily   CVA: on ASA and statin  CKD vs AKI: baseline back in 2020  1.1-1.2. Elevated at 1.8 on admission, but improved to 1.5 this morning -- holding lisinopril -- start pre-cath fluids   For questions or updates, please contact [place] Please consult [email or web address] for contact info under   Signed, Reino Bellis, NP  04/29/2021, 8:18 AM

## 2021-04-29 NOTE — Progress Notes (Addendum)
The patient has been seen in conjunction with Harlan Stains, NP. All aspects of care have been considered and discussed. The patient has been personally interviewed, examined, and all clinical data has been reviewed.  Non-ST elevation MI in the setting of prior CVA, hypertension, hyperlipidemia, CKD 3, and prediabetes/diabetes. Bilateral radial pulses 2+. EKG reveals right bundle with initial inferior T wave inversion, resolved this morning. Troponins are mildly elevated. Agree with proceeding with coronary angiography to define anatomy and help guide therapy in this high risk patient. He would benefit from the addition of SGLT2 therapy given renal insufficiency. The patient was counseled to undergo left heart catheterization, coronary angiography, and possible percutaneous coronary intervention with stent implantation. The procedural risks and benefits were discussed in detail. The risks discussed included death, stroke, myocardial infarction, life-threatening bleeding, limb ischemia, kidney injury, allergy, and possible emergency cardiac surgery. The risk of these significant complications were estimated to occur less than 1% of the time. After discussion, the patient has agreed to proceed.    Progress Note  Patient Name: Jerry Moran Date of Encounter: 04/29/2021  [facility] Cardiologist: None   Subjective   No chest pain this morning. Long discussion regarding plans for cardiac cath.   Inpatient Medications    Scheduled Meds:  amLODipine  10 mg Oral Daily   aspirin EC  81 mg Oral Daily   atorvastatin  40 mg Oral QHS   metoprolol tartrate  12.5 mg Oral BID   sodium chloride flush  3 mL Intravenous Q12H   tamsulosin  0.4 mg Oral Daily   Continuous Infusions:  heparin 950 Units/hr (04/29/21 0514)   PRN Meds: acetaminophen, nitroGLYCERIN, nitroGLYCERIN, ondansetron (ZOFRAN) IV   Vital Signs    Vitals:   04/29/21 0445 04/29/21 0515 04/29/21 0545 04/29/21 0615   BP: (!) 152/88 (!) 149/87 (!) 158/98 (!) 150/88  Pulse: 65 65 87 70  Resp: 16 16 16 16   Temp:      TempSrc:      SpO2: 97% 96% 96% 95%  Weight:      Height:        Intake/Output Summary (Last 24 hours) at 04/29/2021 0818 Last data filed at 04/28/2021 2143 Gross per 24 hour  Intake 1000 ml  Output --  Net 1000 ml   Last 3 Weights 04/28/2021 04/01/2021 09/02/2020  Weight (lbs) 175 lb 185 lb 173 lb 4 oz  Weight (kg) 79.379 kg 83.915 kg 78.586 kg      Telemetry    SR - Personally Reviewed  ECG    SR, 73 RBBB, LPFB - Personally Reviewed  Physical Exam   GEN: No acute distress.   Neck: No JVD Cardiac: RRR, no murmurs, rubs, or gallops.  Respiratory: Clear to auscultation bilaterally. GI: Soft, nontender, non-distended  MS: Mild bilateral LE edema; No deformity. Neuro:  Nonfocal  Psych: Normal affect   Labs    High Sensitivity Troponin:   Recent Labs  Lab 04/28/21 1759 04/28/21 1945  TROPONINIHS 321* 311*     Chemistry Recent Labs  Lab 04/28/21 1759 04/29/21 0213  NA 139 137  K 3.5 3.4*  CL 104 104  CO2 25 25  GLUCOSE 258* 219*  BUN 16 11  CREATININE 1.82* 1.54*  CALCIUM 8.8* 8.6*  GFRNONAA 40* 49*  ANIONGAP 10 8    Lipids  Recent Labs  Lab 04/29/21 0213  CHOL 148  TRIG 83  HDL 40*  LDLCALC 91  CHOLHDL 3.7    Hematology Recent Labs  Lab 04/28/21 1759 04/29/21 0213  WBC 5.0 5.1  RBC 3.73* 3.32*  HGB 11.4* 10.4*  HCT 34.3* 30.3*  MCV 92.0 91.3  MCH 30.6 31.3  MCHC 33.2 34.3  RDW 12.8 12.6  PLT 217 194   Thyroid No results for input(s): TSH, FREET4 in the last 168 hours.  BNP Recent Labs  Lab 04/29/21 0213  BNP 102.1*    DDimer No results for input(s): DDIMER in the last 168 hours.   Radiology    DG Chest 1 View  Result Date: 04/28/2021 CLINICAL DATA:  Chest pain EXAM: CHEST  1 VIEW COMPARISON:  None. FINDINGS: The heart size and mediastinal contours are within normal limits. Both lungs are clear. The visualized skeletal  structures are unremarkable. Small metallic fragments in the left chest wall. IMPRESSION: No active disease. Electronically Signed   By: Donavan Foil M.D.   On: 04/28/2021 18:31    Cardiac Studies   N/a   Patient Profile     66 y.o. male with DM, HTN, HLD, prostate Ca, CVA who presented to ED with chest pain. Found to have a NSTEMI.   Assessment & Plan    NSTEMI: hsTn peaked 321. Does have concerning RF for CAD. Clinical picture is concerning for ACS. Now on IV heparin, recommendations for cardiac cath. Long discussion but he is agreeable to proceed.  -- continue IV heparin, ASA, statin, BB -- echo pending  Shared Decision Making/Informed Consent{ The risks [stroke (1 in 1000), death (1 in 1000), kidney failure [usually temporary] (1 in 500), bleeding (1 in 200), allergic reaction [possibly serious] (1 in 200)], benefits (diagnostic support and management of coronary artery disease) and alternatives of a cardiac catheterization were discussed in detail with Mr. Schumm and he is willing to proceed.  DM: Hgb A1c 6.0 -- SSI while inpatient  HTN: blood pressures are elevated.  -- amlodipine 10mg  daily, metoprolol 12.5mg  BID-->25mg  BID -- home lisinopril held  HLD: LDL 91 -- atorvastatin 40mg  daily   CVA: on ASA and statin  CKD vs AKI: baseline back in 2020  1.1-1.2. Elevated at 1.8 on admission, but improved to 1.5 this morning -- holding lisinopril -- start pre-cath fluids   For questions or updates, please contact [place] Please consult [email or web address] for contact info under   Signed, Reino Bellis, NP  04/29/2021, 8:18 AM

## 2021-04-30 DIAGNOSIS — N1831 Chronic kidney disease, stage 3a: Secondary | ICD-10-CM

## 2021-04-30 DIAGNOSIS — E785 Hyperlipidemia, unspecified: Secondary | ICD-10-CM | POA: Diagnosis not present

## 2021-04-30 DIAGNOSIS — I2510 Atherosclerotic heart disease of native coronary artery without angina pectoris: Secondary | ICD-10-CM

## 2021-04-30 DIAGNOSIS — I214 Non-ST elevation (NSTEMI) myocardial infarction: Secondary | ICD-10-CM | POA: Diagnosis not present

## 2021-04-30 LAB — BASIC METABOLIC PANEL
Anion gap: 7 (ref 5–15)
BUN: 11 mg/dL (ref 8–23)
CO2: 20 mmol/L — ABNORMAL LOW (ref 22–32)
Calcium: 8.5 mg/dL — ABNORMAL LOW (ref 8.9–10.3)
Chloride: 111 mmol/L (ref 98–111)
Creatinine, Ser: 1.47 mg/dL — ABNORMAL HIGH (ref 0.61–1.24)
GFR, Estimated: 52 mL/min — ABNORMAL LOW (ref >60)
Glucose, Bld: 115 mg/dL — ABNORMAL HIGH (ref 70–99)
Potassium: 4 mmol/L (ref 3.5–5.1)
Sodium: 138 mmol/L (ref 135–145)

## 2021-04-30 LAB — CBC
HCT: 30.6 % — ABNORMAL LOW (ref 39.0–52.0)
Hemoglobin: 10.6 g/dL — ABNORMAL LOW (ref 13.0–17.0)
MCH: 30.5 pg (ref 26.0–34.0)
MCHC: 34.6 g/dL (ref 30.0–36.0)
MCV: 88.2 fL (ref 80.0–100.0)
Platelets: 203 10*3/uL (ref 150–400)
RBC: 3.47 MIL/uL — ABNORMAL LOW (ref 4.22–5.81)
RDW: 12.7 % (ref 11.5–15.5)
WBC: 6.5 10*3/uL (ref 4.0–10.5)
nRBC: 0.9 % — ABNORMAL HIGH (ref 0.0–0.2)

## 2021-04-30 LAB — GLUCOSE, CAPILLARY
Glucose-Capillary: 159 mg/dL — ABNORMAL HIGH (ref 70–99)
Glucose-Capillary: 186 mg/dL — ABNORMAL HIGH (ref 70–99)

## 2021-04-30 MED ORDER — TICAGRELOR 90 MG PO TABS: 90.0000 mg | ORAL_TABLET | Freq: Two times a day (BID) | ORAL | 6 refills | Status: DC

## 2021-04-30 MED ORDER — ATORVASTATIN CALCIUM 80 MG PO TABS: 80.0000 mg | ORAL_TABLET | Freq: Every day | ORAL | 3 refills | Status: DC

## 2021-04-30 MED ORDER — CARVEDILOL 25 MG PO TABS: 25.0000 mg | ORAL_TABLET | Freq: Two times a day (BID) | ORAL | Status: DC

## 2021-04-30 MED ORDER — NITROGLYCERIN 0.4 MG SL SUBL: 0.4000 mg | SUBLINGUAL_TABLET | SUBLINGUAL | 3 refills | Status: AC | PRN

## 2021-04-30 MED ORDER — CARVEDILOL 25 MG PO TABS: 25.0000 mg | ORAL_TABLET | Freq: Two times a day (BID) | ORAL | 6 refills | Status: DC

## 2021-04-30 NOTE — Discharge Instructions (Signed)
PLEASE REMEMBER TO BRING ALL OF YOUR MEDICATIONS TO EACH OF YOUR FOLLOW-UP OFFICE VISITS.  PLEASE ATTEND ALL SCHEDULED FOLLOW-UP APPOINTMENTS.   Activity: Increase activity slowly as tolerated. You may shower, but no soaking baths (or swimming) for 1 week. No driving for 24 hours. No lifting over 5 lbs for 1 week. No sexual activity for 1 week.   You May Return to Work: in 1 week (if applicable)  Wound Care: You may wash cath site gently with soap and water. Keep cath site clean and dry. If you notice pain, swelling, bleeding or pus at your cath site, please call [phone].   PLEASE DO NOT MISS ANY DOSES OF YOUR Aspirin and BRILINTA!!!!! Also keep a log of you blood pressures and bring back to your follow up appt. Please call the office with any questions.   Patients taking blood thinners should generally stay away from medicines like ibuprofen, Advil, Motrin, naproxen, and Aleve due to risk of stomach bleeding. You may take Tylenol as directed or talk to your primary doctor about alternatives.  PLEASE ENSURE THAT YOU DO NOT RUN OUT OF YOUR BRILINTA. This medication is very important to remain on for at least one year. IF you have issues obtaining this medication due to cost please CALL the office 3-5 business days prior to running out in order to prevent missing doses of this medication.   New medications: - START brilinta (ticagrelor) 90mg  two times per day to help keep your stent open - START coreg (carvedilol) 25mg  two times per day for improved blood pressure control - INCREASE lipitor (atorvastatin) to 80mg  daily for improved cholesterol control - START sublingual (under the tongue) nitroglylcerin - use as needed for chest pain or shortness of breath that does not go away with rest. Take one tab under the tongue if needed, wait 5 minutes and if symptoms are ongoing you can take an additional tab under the tongue. You can take up to 3 tabs in a 15 minute period, though if taking a 3rd  tab, please call EMS to get evaluated.  - Please do not take your metformin until 05/02/21, at which time you can restart as prescribed.   Please keep a log of your blood pressures to bring to your follow-up visit 05/11/21. Check you blood pressure 1 hour after taking your medications two times per day.

## 2021-04-30 NOTE — Discharge Summary (Addendum)
Discharge Summary    Patient ID: Jerry Moran MRN: [id number]; DOB: October 17, 1955  Admit date: 04/28/2021 Discharge date: 04/30/2021  PCP:  [facility]   [facility] Providers Cardiologist:  Seen by Dr. Tamala Julian this admission though may be reasonable to connect with a different provider given slow down.    Discharge Diagnoses    Principal Problem:   NSTEMI (non-ST elevated myocardial infarction) [facility]) Active Problems:   Type 2 diabetes mellitus with hemoglobin A1c goal of less than 7.0% (HCC)   Essential hypertension   Hyperlipidemia with target LDL less than 70   Stage 3a chronic kidney disease (CKD) (HCC)   Coronary artery disease    Diagnostic Studies/Procedures    Echocardiogram 04/29/2021: 1. Left ventricular ejection fraction, by estimation, is 60 to 65%. The  left ventricle has normal function. The left ventricle has no regional  wall motion abnormalities. Left ventricular diastolic parameters are  indeterminate. Elevated left ventricular  end-diastolic pressure.   2. Right ventricular systolic function is normal. The right ventricular  size is normal. Tricuspid regurgitation signal is inadequate for assessing  PA pressure.   3. The mitral valve is normal in structure. Mild mitral valve  regurgitation. No evidence of mitral stenosis.   4. The aortic valve is normal in structure. Aortic valve regurgitation is  trivial. No aortic stenosis is present.   5. The inferior vena cava is normal in size with greater than 50%  respiratory variability, suggesting right atrial pressure of 3 mmHg.   LHC 04/29/2021:   Dist RCA lesion is 99% stenosed.   1st Mrg-1 lesion is 60% stenosed.   Prox Cx to Mid Cx lesion is 40% stenosed.   1st Mrg-2 lesion is 80% stenosed.   Lat 1st Mrg lesion is 80% stenosed.   Prox LAD to Mid LAD lesion is 50% stenosed.   A drug-eluting stent was successfully placed using a STENT ONYX FRONTIER 2.5X12.   Post intervention, there is a  0% residual stenosis.   The LAD is a large caliber vessel that courses to the apex. The mid LAD has moderate stenosis. RFR 0.92 suggesting this lesion is not flow limiting.  The Circumflex is a large vessel. The first obtuse marginal branch is a large bifurcating vessel. The proximal segment of the obtuse marginal branch has a moderate stenosis. The vessel then bifurcates. There is diffuse severe disease in both distal sub-branches of the obtuse marginal branch.  The RCA is a large dominant vessel with severe stenosis just before the bifurcation. This is felt to be the culprit lesion. Successful PTCA/DES x 1 distal RCA Mild elevation LV filling pressures   Recommendations: DAPT with ASA and Brilinta for one year. High intensity statin. Beta blocker as tolerated. Echo is pending. Recheck renal function tomorrow. Medical management of residual CAD in the Circumflex artery. Case reviewed with IC team.    Diagnostic Dominance: Right Intervention  Implants     _____________   History of Present Illness     Jerry Moran is a 66 y.o. male with type 2 diabetes mellitus, hypertension, prostate cancer, recent CVA who is being seen 04/28/2021 for the evaluation of new onset chest pain.  Jerry Moran is a 65 year old gentleman with type 2 diabetes mellitus, hypertension, prostate cancer, and recent CVA who had acute onset left-sided chest pain that started on the evening of 04/28/2021.  Noticed the pain was substernal and nonreproducible.  Was not radiating and did not have any associated symptoms.  He denied nausea, vomiting,  abdominal pain, left-sided arm or jaw pain, or any other new symptoms.  He noted some increased fatigability, but habitus is difficult access given his stroke related weakness in his legs.   The pain was constant dull and aching, so he alerted EMS.  On their arrival ECG was unremarkable and he was given 324 mg of aspirin.  He was brought to the emergency department for further  evaluation.   The time he had arrived to the emergency department his chest pain had subsided.  He was hypertensive with blood pressures ranging 630Z to 601U systolic.  Initial laboratory evaluation in the emergency department revealed a creatinine of 1.8, and a high-sensitivity troponin I 321 with repeat of 311 (delta 10).  At that time he was started on heparin and cardiology was called for consultation and admission.   When I saw him, he was chest pain-free and resting comfortably in bed.    Hospital Course     Consultants: None   1.  NSTEMI: Patient presented with persistent substernal chest pain prompting EMS activation.  He was given 324 mg of aspirin with improvement in chest pain.  Initial EKG did not show any ischemic changes.  He was hypertensive with SBP in the 140s-170s on arrival.  High-sensitivity troponins peaked at 321 and trended down.  He was started on a heparin drip.  He underwent LHC 04/29/2021 which revealed multivessel CAD with moderate LAD, LCx, and pOM1 with evidence of severe stenosis in distal OM1/bifurcation which were medically managed.  He was found to have 99% distal RCA stenosis which was managed with PCI/DES.  He was started on aspirin and Brilinta for anticipated 1 year of uninterrupted therapy.  He underwent an echocardiogram 04/29/2021 which showed EF 60-65%, no R WMA, indeterminate LV diastolic function, elevated LVEDP, and mild MR - Continue aspirin and Brilinta - Continue statin - Continue beta-blocker - Rx/administration instructions given for SL nitro  2. HTN: BP poorly controlled this admission. He was started on Bblocker in addition to home amlodipine 61m daily with plans to discharge on carvedilol 258mBID. Home lasix and lisinopril restarted at discharge - Continue carvedilol, amlodipine, lisinopril, and lasix.   3. HLD: LDL 91 this admission. He was encouraged to restart atorvastatin at 8036maily - Continue atorvastatin 55m60mily - Will need  FLP/LFTs in 6-8 weeks - to be coordinated outpatient.   4. DM type 2: A1C 6.0.  - Continue glipizide and metformin (restart 05/02/21)  5. Likely CKD stage 3a: Cr elevated to 1.8 on admission, down to 1.47 on the day of discharge. Suspect this may be his baseline given poorly controlled HTN. - Continue to monitor closely outpatient.     Did the patient have an acute coronary syndrome (MI, NSTEMI, STEMI, etc) this admission?:  Yes                               AHA/ACC Clinical Performance & Quality Measures: Aspirin prescribed? - Yes ADP Receptor Inhibitor (Plavix/Clopidogrel, Brilinta/Ticagrelor or Effient/Prasugrel) prescribed (includes medically managed patients)? - Yes Beta Blocker prescribed? - Yes High Intensity Statin (Lipitor 40-55mg55mCrestor 20-40mg)110mscribed? - Yes EF assessed during THIS hospitalization? - Yes For EF <40%, was ACEI/ARB prescribed? - Not Applicable (EF >/= 40%) F93%EF <40%, Aldosterone Antagonist (Spironolactone or Eplerenone) prescribed? - Not Applicable (EF >/= 40%) C23%iac Rehab Phase II ordered (including medically managed patients)? - Yes       The  patient will be scheduled for a TOC follow up appointment with Ermalinda Barrios, [name] 05/11/21.  A message has been sent to the [facility] and Scheduling Pool at the office where the patient should be seen for follow up.  _____________  Discharge Vitals Blood pressure (!) 175/89, pulse 79, temperature 98 F (36.7 C), temperature source Oral, resp. rate 16, height 5' 8"  (1.727 m), weight 79.4 kg, SpO2 100 %.  Filed Weights   04/28/21 1946  Weight: 79.4 kg    Labs & Radiologic Studies    CBC Recent Labs    04/29/21 0213 04/30/21 0203  WBC 5.1 6.5  HGB 10.4* 10.6*  HCT 30.3* 30.6*  MCV 91.3 88.2  PLT 194 865   Basic Metabolic Panel Recent Labs    04/29/21 0213 04/30/21 0203  NA 137 138  K 3.4* 4.0  CL 104 111  CO2 25 20*  GLUCOSE 219* 115*  BUN 11 11  CREATININE 1.54* 1.47*  CALCIUM 8.6*  8.5*   Liver Function Tests No results for input(s): AST, ALT, ALKPHOS, BILITOT, PROT, ALBUMIN in the last 72 hours. No results for input(s): LIPASE, AMYLASE in the last 72 hours. High Sensitivity Troponin:   Recent Labs  Lab 04/28/21 1759 04/28/21 1945  TROPONINIHS 321* 311*    BNP Invalid input(s): POCBNP D-Dimer No results for input(s): DDIMER in the last 72 hours. Hemoglobin A1C Recent Labs    04/29/21 0213  HGBA1C 6.0*   Fasting Lipid Panel Recent Labs    04/29/21 0213  CHOL 148  HDL 40*  LDLCALC 91  TRIG 83  CHOLHDL 3.7   Thyroid Function Tests No results for input(s): TSH, T4TOTAL, T3FREE, THYROIDAB in the last 72 hours.  Invalid input(s): FREET3 _____________  DG Chest 1 View  Result Date: 04/28/2021 CLINICAL DATA:  Chest pain EXAM: CHEST  1 VIEW COMPARISON:  None. FINDINGS: The heart size and mediastinal contours are within normal limits. Both lungs are clear. The visualized skeletal structures are unremarkable. Small metallic fragments in the left chest wall. IMPRESSION: No active disease. Electronically Signed   By: Donavan Foil M.D.   On: 04/28/2021 18:31   CARDIAC CATHETERIZATION  Result Date: 04/29/2021   Dist RCA lesion is 99% stenosed.   1st Mrg-1 lesion is 60% stenosed.   Prox Cx to Mid Cx lesion is 40% stenosed.   1st Mrg-2 lesion is 80% stenosed.   Lat 1st Mrg lesion is 80% stenosed.   Prox LAD to Mid LAD lesion is 50% stenosed.   A drug-eluting stent was successfully placed using a STENT ONYX FRONTIER 2.5X12.   Post intervention, there is a 0% residual stenosis. The LAD is a large caliber vessel that courses to the apex. The mid LAD has moderate stenosis. RFR 0.92 suggesting this lesion is not flow limiting. The Circumflex is a large vessel. The first obtuse marginal branch is a large bifurcating vessel. The proximal segment of the obtuse marginal branch has a moderate stenosis. The vessel then bifurcates. There is diffuse severe disease in both distal  sub-branches of the obtuse marginal branch. The RCA is a large dominant vessel with severe stenosis just before the bifurcation. This is felt to be the culprit lesion. Successful PTCA/DES x 1 distal RCA Mild elevation LV filling pressures Recommendations: DAPT with ASA and Brilinta for one year. High intensity statin. Beta blocker as tolerated. Echo is pending. Recheck renal function tomorrow. Medical management of residual CAD in the Circumflex artery. Case reviewed with IC team.  ECHOCARDIOGRAM COMPLETE  Result Date: 04/29/2021    ECHOCARDIOGRAM REPORT   Patient Name:   Jerry Moran Date of Exam: 04/29/2021 Medical Rec #:  [id number]         Height:       68.0 in Accession #:    [id number]        Weight:       175.0 lb Date of Birth:  09-07-1955         BSA:          1.931 m Patient Age:    69 years          BP:           176/88 mmHg Patient Gender: M                 HR:           74 bpm. Exam Location:  Inpatient Procedure: 2D Echo and Strain Analysis Indications:    acute myocardial infarction  History:        Patient has prior history of Echocardiogram examinations, most                 recent 01/14/2019. Risk Factors:Dyslipidemia.  Sonographer:    Johny Chess RDCS Referring Phys: [id number] [place]  1. Left ventricular ejection fraction, by estimation, is 60 to 65%. The left ventricle has normal function. The left ventricle has no regional wall motion abnormalities. Left ventricular diastolic parameters are indeterminate. Elevated left ventricular end-diastolic pressure.  2. Right ventricular systolic function is normal. The right ventricular size is normal. Tricuspid regurgitation signal is inadequate for assessing PA pressure.  3. The mitral valve is normal in structure. Mild mitral valve regurgitation. No evidence of mitral stenosis.  4. The aortic valve is normal in structure. Aortic valve regurgitation is trivial. No aortic stenosis is present.  5. The inferior vena cava  is normal in size with greater than 50% respiratory variability, suggesting right atrial pressure of 3 mmHg. FINDINGS  Left Ventricle: Left ventricular ejection fraction, by estimation, is 60 to 65%. The left ventricle has normal function. The left ventricle has no regional wall motion abnormalities. Global longitudinal strain performed but not reported based on interpreter judgement due to suboptimal tracking. The left ventricular internal cavity size was normal in size. There is no left ventricular hypertrophy. Left ventricular diastolic parameters are indeterminate. Elevated left ventricular end-diastolic pressure. Right Ventricle: The right ventricular size is normal. No increase in right ventricular wall thickness. Right ventricular systolic function is normal. Tricuspid regurgitation signal is inadequate for assessing PA pressure. Left Atrium: Left atrial size was normal in size. Right Atrium: Right atrial size was normal in size. Pericardium: There is no evidence of pericardial effusion. Mitral Valve: The mitral valve is normal in structure. Mild mitral valve regurgitation. No evidence of mitral valve stenosis. Tricuspid Valve: The tricuspid valve is normal in structure. Tricuspid valve regurgitation is trivial. No evidence of tricuspid stenosis. Aortic Valve: The aortic valve is normal in structure. Aortic valve regurgitation is trivial. No aortic stenosis is present. Pulmonic Valve: The pulmonic valve was normal in structure. Pulmonic valve regurgitation is trivial. No evidence of pulmonic stenosis. Aorta: The aortic root is normal in size and structure. Venous: The inferior vena cava is normal in size with greater than 50% respiratory variability, suggesting right atrial pressure of 3 mmHg. IAS/Shunts: No atrial level shunt detected by color flow Doppler.  LEFT VENTRICLE PLAX 2D LVIDd:  5.00 cm   Diastology LVIDs:         3.40 cm   LV e' medial:    5.77 cm/s LV PW:         1.30 cm   LV E/e' medial:   17.5 LV IVS:        1.00 cm   LV e' lateral:   5.66 cm/s LVOT diam:     2.10 cm   LV E/e' lateral: 17.8 LV SV:         69 LV SV Index:   36 LVOT Area:     3.46 cm  RIGHT VENTRICLE             IVC RV S prime:     11.50 cm/s  IVC diam: 1.80 cm TAPSE (M-mode): 2.5 cm LEFT ATRIUM             Index        RIGHT ATRIUM           Index LA diam:        3.70 cm 1.92 cm/m   RA Area:     13.70 cm LA Vol (A2C):   51.5 ml 26.67 ml/m  RA Volume:   33.00 ml  17.09 ml/m LA Vol (A4C):   54.1 ml 28.02 ml/m LA Biplane Vol: 54.4 ml 28.17 ml/m  AORTIC VALVE LVOT Vmax:   95.40 cm/s LVOT Vmean:  60.900 cm/s LVOT VTI:    0.200 m  AORTA Ao Root diam: 3.00 cm Ao Asc diam:  3.40 cm MITRAL VALVE MV Area (PHT): 4.80 cm     SHUNTS MV Decel Time: 158 msec     Systemic VTI:  0.20 m MR Peak grad: 103.6 mmHg    Systemic Diam: 2.10 cm MR Mean grad: 70.0 mmHg MR Vmax:      509.00 cm/s MR Vmean:     400.0 cm/s MV E velocity: 101.00 cm/s MV A velocity: 82.50 cm/s MV E/A ratio:  1.22 Fransico Him MD Electronically signed by Fransico Him MD Signature Date/Time: 04/29/2021/4:46:45 PM    Final    Disposition   Pt is being discharged home today in good condition.  Follow-up Plans & Appointments     Follow-up Information     Imogene Burn, [name] Follow up on 05/11/2021.   Specialty: Cardiology Why: Please arrive 15 minutes early for your 12:15 pm post-hospital cardiology follow-up visit. Contact information: [address] [place] [address] [phone]                Discharge Instructions     Amb Referral to Cardiac Rehabilitation   Complete by: As directed    Diagnosis:  Coronary Stents NSTEMI     After initial evaluation and assessments completed: Virtual Based Care may be provided alone or in conjunction with Phase 2 Cardiac Rehab based on patient barriers.: Yes       Discharge Medications   Allergies as of 04/30/2021   No Known Allergies      Medication List     TAKE these  medications    amLODipine 10 MG tablet Commonly known as: NORVASC Take 1 tablet (10 mg total) by mouth daily.   atorvastatin 80 MG tablet Commonly known as: LIPITOR Take 1 tablet (80 mg total) by mouth at bedtime. What changed:  medication strength how much to take Another medication with the same name was removed. Continue taking this medication, and follow the directions you see here.  Bayer Low Dose 81 MG EC tablet Generic drug: aspirin Take 81 mg by mouth daily. Swallow whole.   blood glucose meter kit and supplies Kit Dispense based on patient and insurance preference. Use up to four times daily as directed. (FOR ICD-9 250.00, 250.01).   carvedilol 25 MG tablet Commonly known as: COREG Take 1 tablet (25 mg total) by mouth 2 (two) times daily with a meal.   furosemide 20 MG tablet Commonly known as: LASIX Take 20 mg by mouth in the morning.   glipiZIDE 5 MG tablet Commonly known as: GLUCOTROL Take 5 mg by mouth 2 (two) times daily before a meal.   lisinopril 40 MG tablet Commonly known as: ZESTRIL Take 40 mg by mouth every morning.   metFORMIN 1000 MG tablet Commonly known as: GLUCOPHAGE Take 1,000 mg by mouth 2 (two) times daily with a meal. What changed: Another medication with the same name was removed. Continue taking this medication, and follow the directions you see here. Notes to patient: Restart 05/02/21   nitroGLYCERIN 0.4 MG SL tablet Commonly known as: NITROSTAT Place 1 tablet (0.4 mg total) under the tongue every 5 (five) minutes as needed for chest pain.   tamsulosin 0.4 MG Caps capsule Commonly known as: FLOMAX Take 0.4 mg by mouth daily.   ticagrelor 90 MG Tabs tablet Commonly known as: BRILINTA Take 1 tablet (90 mg total) by mouth 2 (two) times daily.           Outstanding Labs/Studies   Check BMET at follow-up Will need repeat FLP/LFTs in 6-8 weeks  Duration of Discharge Encounter   Greater than 30 minutes including physician  time.  Signed, Abigail Butts, [name] 04/30/2021, 12:20 PM   Attending Note:   The patient was seen and examined.  Agree with assessment and plan as noted above.  Changes made to the above note as needed.  Patient seen and independently examined with Roby Lofts, PA .   We discussed all aspects of the encounter. I agree with the assessment and plan as stated above.     CAD :  pain free.  S/p stenting of distal RCA . Has mod - severe disease in his distal OM branches and has mod disease in the LAD Brilinta and ASA for a year  Needs aggressive lipid lowering      2. Hyperlipidemia : He has been started on atorvastatin 80 mg a day.   Follow-up with Dr. Johney Frame or an APP in the next several weeks.   I have spent a total of 40 minutes with patient reviewing hospital  notes , telemetry, EKGs, labs and examining patient as well as establishing an assessment and plan that was discussed with the patient.  > 50% of time was spent in direct patient care.   [facility], Brooke Bonito., MD, [facility] 05/01/2021, 1:06 PM [address],  [place] Pager [phone]

## 2021-04-30 NOTE — Progress Notes (Signed)
Progress Note  Patient Name: Jerry Moran Date of Encounter: 04/30/2021  [facility] Cardiologist:   Tamala Julian   Subjective   66 yo with hx of NSTEMI  Hx of prior CVA, HTN, HLD, CKD  Cath yesterday reveals:  LAD: mod LAD disese LCx: large branch,  diffuse severe disease in the distal aspects of the OM branches RCA :  severe stenosis  - s/p DES to the distal RCA   He has walked 140 feet this am with cardiac rehab. He is a Psychologist, clinical. [facility] need a 30 day voucher for brilinta And other meds sent to a local pharmacy    Inpatient Medications    Scheduled Meds:  amLODipine  10 mg Oral Daily   aspirin EC  81 mg Oral Daily   atorvastatin  80 mg Oral QHS   metoprolol tartrate  25 mg Oral BID   sodium chloride flush  3 mL Intravenous Q12H   sodium chloride flush  3 mL Intravenous Q12H   tamsulosin  0.4 mg Oral Daily   ticagrelor  90 mg Oral BID   Continuous Infusions:  sodium chloride     sodium chloride     PRN Meds: sodium chloride, sodium chloride, acetaminophen, nitroGLYCERIN, ondansetron (ZOFRAN) IV, sodium chloride flush, sodium chloride flush   Vital Signs    Vitals:   04/29/21 2135 04/30/21 0148 04/30/21 0530 04/30/21 0809  BP: (!) 171/94 (!) 154/83 (!) 159/103 (!) 174/90  Pulse: 85 70 97 79  Resp:  17 16 16   Temp:  98.5 F (36.9 C) 98.5 F (36.9 C) 98.5 F (36.9 C)  TempSrc:  Oral Oral Oral  SpO2:  99% 99% 100%  Weight:      Height:        Intake/Output Summary (Last 24 hours) at 04/30/2021 0933 Last data filed at 04/30/2021 0900 Gross per 24 hour  Intake 360 ml  Output 1150 ml  Net -790 ml   Last 3 Weights 04/28/2021 04/01/2021 09/02/2020  Weight (lbs) 175 lb 185 lb 173 lb 4 oz  Weight (kg) 79.379 kg 83.915 kg 78.586 kg      Telemetry     - Personally Reviewed  ECG     - Personally Reviewed  Physical Exam   GEN: No acute distress.   Neck: No JVD Cardiac: RRR, no murmurs, rubs, or gallops.  Respiratory: Clear to  auscultation bilaterally. GI: Soft, nontender, non-distended  MS: No edema; No deformity.  R radial cath site looks good  Neuro:  Nonfocal  Psych: Normal affect   Labs    High Sensitivity Troponin:   Recent Labs  Lab 04/28/21 1759 04/28/21 1945  TROPONINIHS 321* 311*     Chemistry Recent Labs  Lab 04/28/21 1759 04/29/21 0213 04/30/21 0203  NA 139 137 138  K 3.5 3.4* 4.0  CL 104 104 111  CO2 25 25 20*  GLUCOSE 258* 219* 115*  BUN 16 11 11   CREATININE 1.82* 1.54* 1.47*  CALCIUM 8.8* 8.6* 8.5*  GFRNONAA 40* 49* 52*  ANIONGAP 10 8 7     Lipids  Recent Labs  Lab 04/29/21 0213  CHOL 148  TRIG 83  HDL 40*  LDLCALC 91  CHOLHDL 3.7    Hematology Recent Labs  Lab 04/28/21 1759 04/29/21 0213 04/30/21 0203  WBC 5.0 5.1 6.5  RBC 3.73* 3.32* 3.47*  HGB 11.4* 10.4* 10.6*  HCT 34.3* 30.3* 30.6*  MCV 92.0 91.3 88.2  MCH 30.6 31.3 30.5  MCHC 33.2 34.3 34.6  RDW 12.8 12.6 12.7  PLT 217 194 203   Thyroid No results for input(s): TSH, FREET4 in the last 168 hours.  BNP Recent Labs  Lab 04/29/21 0213  BNP 102.1*    DDimer No results for input(s): DDIMER in the last 168 hours.   Radiology    DG Chest 1 View  Result Date: 04/28/2021 CLINICAL DATA:  Chest pain EXAM: CHEST  1 VIEW COMPARISON:  None. FINDINGS: The heart size and mediastinal contours are within normal limits. Both lungs are clear. The visualized skeletal structures are unremarkable. Small metallic fragments in the left chest wall. IMPRESSION: No active disease. Electronically Signed   By: Donavan Foil M.D.   On: 04/28/2021 18:31   CARDIAC CATHETERIZATION  Result Date: 04/29/2021   Dist RCA lesion is 99% stenosed.   1st Mrg-1 lesion is 60% stenosed.   Prox Cx to Mid Cx lesion is 40% stenosed.   1st Mrg-2 lesion is 80% stenosed.   Lat 1st Mrg lesion is 80% stenosed.   Prox LAD to Mid LAD lesion is 50% stenosed.   A drug-eluting stent was successfully placed using a STENT ONYX FRONTIER 2.5X12.   Post  intervention, there is a 0% residual stenosis. The LAD is a large caliber vessel that courses to the apex. The mid LAD has moderate stenosis. RFR 0.92 suggesting this lesion is not flow limiting. The Circumflex is a large vessel. The first obtuse marginal branch is a large bifurcating vessel. The proximal segment of the obtuse marginal branch has a moderate stenosis. The vessel then bifurcates. There is diffuse severe disease in both distal sub-branches of the obtuse marginal branch. The RCA is a large dominant vessel with severe stenosis just before the bifurcation. This is felt to be the culprit lesion. Successful PTCA/DES x 1 distal RCA Mild elevation LV filling pressures Recommendations: DAPT with ASA and Brilinta for one year. High intensity statin. Beta blocker as tolerated. Echo is pending. Recheck renal function tomorrow. Medical management of residual CAD in the Circumflex artery. Case reviewed with IC team.   ECHOCARDIOGRAM COMPLETE  Result Date: 04/29/2021    ECHOCARDIOGRAM REPORT   Patient Name:   Jerry Moran Date of Exam: 04/29/2021 Medical Rec #:  [id number]         Height:       68.0 in Accession #:    [id number]        Weight:       175.0 lb Date of Birth:  08-02-1955         BSA:          1.931 m Patient Age:    15 years          BP:           176/88 mmHg Patient Gender: M                 HR:           74 bpm. Exam Location:  Inpatient Procedure: 2D Echo and Strain Analysis Indications:    acute myocardial infarction  History:        Patient has prior history of Echocardiogram examinations, most                 recent 01/14/2019. Risk Factors:Dyslipidemia.  Sonographer:    Johny Chess RDCS Referring Phys: [id number] [place]  1. Left ventricular ejection fraction, by estimation, is 60 to 65%. The left ventricle has normal function.  The left ventricle has no regional wall motion abnormalities. Left ventricular diastolic parameters are indeterminate. Elevated left  ventricular end-diastolic pressure.  2. Right ventricular systolic function is normal. The right ventricular size is normal. Tricuspid regurgitation signal is inadequate for assessing PA pressure.  3. The mitral valve is normal in structure. Mild mitral valve regurgitation. No evidence of mitral stenosis.  4. The aortic valve is normal in structure. Aortic valve regurgitation is trivial. No aortic stenosis is present.  5. The inferior vena cava is normal in size with greater than 50% respiratory variability, suggesting right atrial pressure of 3 mmHg. FINDINGS  Left Ventricle: Left ventricular ejection fraction, by estimation, is 60 to 65%. The left ventricle has normal function. The left ventricle has no regional wall motion abnormalities. Global longitudinal strain performed but not reported based on interpreter judgement due to suboptimal tracking. The left ventricular internal cavity size was normal in size. There is no left ventricular hypertrophy. Left ventricular diastolic parameters are indeterminate. Elevated left ventricular end-diastolic pressure. Right Ventricle: The right ventricular size is normal. No increase in right ventricular wall thickness. Right ventricular systolic function is normal. Tricuspid regurgitation signal is inadequate for assessing PA pressure. Left Atrium: Left atrial size was normal in size. Right Atrium: Right atrial size was normal in size. Pericardium: There is no evidence of pericardial effusion. Mitral Valve: The mitral valve is normal in structure. Mild mitral valve regurgitation. No evidence of mitral valve stenosis. Tricuspid Valve: The tricuspid valve is normal in structure. Tricuspid valve regurgitation is trivial. No evidence of tricuspid stenosis. Aortic Valve: The aortic valve is normal in structure. Aortic valve regurgitation is trivial. No aortic stenosis is present. Pulmonic Valve: The pulmonic valve was normal in structure. Pulmonic valve regurgitation is trivial.  No evidence of pulmonic stenosis. Aorta: The aortic root is normal in size and structure. Venous: The inferior vena cava is normal in size with greater than 50% respiratory variability, suggesting right atrial pressure of 3 mmHg. IAS/Shunts: No atrial level shunt detected by color flow Doppler.  LEFT VENTRICLE PLAX 2D LVIDd:         5.00 cm   Diastology LVIDs:         3.40 cm   LV e' medial:    5.77 cm/s LV PW:         1.30 cm   LV E/e' medial:  17.5 LV IVS:        1.00 cm   LV e' lateral:   5.66 cm/s LVOT diam:     2.10 cm   LV E/e' lateral: 17.8 LV SV:         69 LV SV Index:   36 LVOT Area:     3.46 cm  RIGHT VENTRICLE             IVC RV S prime:     11.50 cm/s  IVC diam: 1.80 cm TAPSE (M-mode): 2.5 cm LEFT ATRIUM             Index        RIGHT ATRIUM           Index LA diam:        3.70 cm 1.92 cm/m   RA Area:     13.70 cm LA Vol (A2C):   51.5 ml 26.67 ml/m  RA Volume:   33.00 ml  17.09 ml/m LA Vol (A4C):   54.1 ml 28.02 ml/m LA Biplane Vol: 54.4 ml 28.17 ml/m  AORTIC VALVE LVOT Vmax:  95.40 cm/s LVOT Vmean:  60.900 cm/s LVOT VTI:    0.200 m  AORTA Ao Root diam: 3.00 cm Ao Asc diam:  3.40 cm MITRAL VALVE MV Area (PHT): 4.80 cm     SHUNTS MV Decel Time: 158 msec     Systemic VTI:  0.20 m MR Peak grad: 103.6 mmHg    Systemic Diam: 2.10 cm MR Mean grad: 70.0 mmHg MR Vmax:      509.00 cm/s MR Vmean:     400.0 cm/s MV E velocity: 101.00 cm/s MV A velocity: 82.50 cm/s MV E/A ratio:  1.22 Fransico Him MD Electronically signed by Fransico Him MD Signature Date/Time: 04/29/2021/4:46:45 PM    Final     Cardiac Studies      Patient Profile     66 y.o. male    Assessment & Plan      CAD :  pain free.  S/p stenting of distal RCA . Has mod - severe disease in his distal OM branches and has mod disease in the LAD Brilinta and ASA for a year  Needs aggressive lipid lowering    2. Hyperlipidemia : He has been started on atorvastatin 80 mg a day.  Follow-up with Dr. Johney Frame or an APP in the next  several weeks.    For questions or updates, please contact [place] Please consult [email or web address] for contact info under        Signed, Mertie Moores, MD  04/30/2021, 9:33 AM

## 2021-04-30 NOTE — Progress Notes (Signed)
1TR BAND REMOVAL  LOCATION:    right radial  DEFLATED PER PROTOCOL:    Yes.    TIME BAND OFF / DRESSING APPLIED:    2300   SITE UPON ARRIVAL:    Level 0  SITE AFTER BAND REMOVAL:    Level 0  CIRCULATION SENSATION AND MOVEMENT:    Within Normal Limits   Yes.    COMMENTS:   Pt.tolerated procedure well

## 2021-04-30 NOTE — Progress Notes (Signed)
CARDIAC REHAB PHASE I   PRE:  Rate/Rhythm: 85 SR    BP: sitting 172/93    SaO2: 98 RA  MODE:  Ambulation: 140 ft   POST:  Rate/Rhythm: 109 ST    BP: sitting 167/116, retake 10 min later 168/95     SaO2:   Pt without c/o. BP elevated, sts he hasn't had meds. Stood and loss of balance with min assist to recover. Walked with his cane, only slightly using it. Min assist with gait belt. Occasionally LOB with recovery due to weak legs and instability. Pt sts he hasn't fallen in a long time. Discussed the risk of bleeding with a fall and asked RN to discuss with wife as she manages his care.   Discussed MI, Brilinta importance, restrictions, stent, diet, bicycle exercise, NTG and CRPII. Pt receptive. Will refer to [place], ACSM 04/30/2021 9:16 AM

## 2021-05-02 ENCOUNTER — Encounter (HOSPITAL_COMMUNITY): Payer: Self-pay | Admitting: Cardiovascular Disease

## 2021-05-02 ENCOUNTER — Telehealth: Payer: Self-pay

## 2021-05-02 NOTE — Telephone Encounter (Deleted)
**Note De-identified Sully Dyment Obfuscation** -----  **Note De-Identified Radley Barto Obfuscation** Message from Abigail Butts, [name] sent at 04/30/2021 11:25 AM EST ----- Regarding: TOC call Hi there! Please place a TOC call for this patient's upcoming visit with Ermalinda Barrios, [name] 05/11/21. Thank you!!

## 2021-05-02 NOTE — Telephone Encounter (Signed)
**Note De-Identified Jerry Moran Obfuscation** Patient contacted regarding discharge from [facility] on 04/30/2021.  Patient understands to follow up with provider Ermalinda Barrios, [name] on 05/11/2021 at 12:15 at [address] [place], [place] [address]. Patient understands discharge instructions? Yes Patient understands medications and regiment? Yes Patient understands to bring all medications to this visit? Yes  Ask patient:  Are you enrolled in My Chart: No, assistance offered but the pt is not interested in signing up at this time.  The pt reports that he has been well since he returned home from the hospital on Saturday and is without any complaints at this time. He denies CP/discomfort, SOB, dizziness, light headedness, nausea, or diaphoresis.  He thanked me for my call and he verified that he does have [facility]'s phone number to call if he has any questions or concerns.

## 2021-05-02 NOTE — Telephone Encounter (Signed)
**Note De-identified Talibah Colasurdo Obfuscation** -----  **Note De-Identified Namine Beahm Obfuscation** Message from Abigail Butts, [name] sent at 04/30/2021 11:25 AM EST ----- Regarding: TOC call Please place a TOC call for this patient's upcoming visit with Ermalinda Barrios, [name] 05/11/21. Thank you!!

## 2021-05-04 NOTE — Progress Notes (Signed)
Cardiology Office Note    Date:  05/11/2021   ID:  Jerry Moran, DOB 01-20-1956, MRN [id number]   PCP:  [facility]inic, [facility]  Cardiologist:  Sinclair Grooms, MD   Advanced Practice Provider:  No care team member to display Electrophysiologist:  None   [id number]}   Chief Complaint  Patient presents with   Hospitalization Follow-up    History of Present Illness:  Jerry Moran is a 66 y.o. male with type 2 diabetes mellitus, hypertension, prostate cancer, recent CVA.  Patient admitted with NSTEMI 04/28/21 treated with DES RCA with residual multivessel CAD with moderate LAD, LCx, and pOM1 with evidence of severe stenosis in distal OM1/bifurcation.He was started on aspirin and Brilinta for anticipated 1 year of uninterrupted therapy. He underwent an echocardiogram 04/29/2021 which showed EF 60-65%, no R WMA, indeterminate LV diastolic function, elevated LVEDP, and mild MR. BP poorly controlled started on carvedilol.  Patient comes in for f/u. Wife on phone. Has a chest cold, coughing up clear mucus, sore throat about 2-3 days after he got home. Has been laying around a lot. Hasn't tested for covid-didn't know he could get it after boosters. Crt 1.47 at discharge. No chest pain or shortness of breath.     Past Medical History:  Diagnosis Date   Diabetes mellitus without complication ([place])    Hypertension    Prostate cancer ([place])    Stroke [facility])     Past Surgical History:  Procedure Laterality Date   CORONARY STENT INTERVENTION N/A 04/29/2021   Procedure: CORONARY STENT INTERVENTION;  Surgeon: Burnell Blanks, MD;  Location: [place] [facility];  Service: Cardiovascular;  Laterality: N/A;   INTRAVASCULAR PRESSURE WIRE/FFR STUDY N/A 04/29/2021   Procedure: INTRAVASCULAR PRESSURE WIRE/FFR STUDY;  Surgeon: Burnell Blanks, MD;  Location: [place] [facility];  Service: Cardiovascular;  Laterality: N/A;   LEFT HEART CATH  AND CORONARY ANGIOGRAPHY N/A 04/29/2021   Procedure: LEFT HEART CATH AND CORONARY ANGIOGRAPHY;  Surgeon: Burnell Blanks, MD;  Location: [place] [facility];  Service: Cardiovascular;  Laterality: N/A;   PROSTATE BIOPSY      Current Medications: Current Meds  Medication Sig   amLODipine (NORVASC) 10 MG tablet Take 1 tablet (10 mg total) by mouth daily.   atorvastatin (LIPITOR) 80 MG tablet Take 1 tablet (80 mg total) by mouth at bedtime.   BAYER LOW DOSE 81 MG EC tablet Take 81 mg by mouth daily. Swallow whole.   blood glucose meter kit and supplies KIT Dispense based on patient and insurance preference. Use up to four times daily as directed. (FOR ICD-9 250.00, 250.01).   carvedilol (COREG) 25 MG tablet Take 1 tablet (25 mg total) by mouth 2 (two) times daily with a meal.   chlorthalidone (HYGROTON) 25 MG tablet TAKE ONE-HALF TABLET BY MOUTH EVERY MORNING FOR BLOOD PRESSURE   furosemide (LASIX) 20 MG tablet Take 20 mg by mouth in the morning.   glipiZIDE (GLUCOTROL) 5 MG tablet Take 5 mg by mouth 2 (two) times daily before a meal.   lisinopril (ZESTRIL) 40 MG tablet Take 40 mg by mouth every morning.   metFORMIN (GLUCOPHAGE) 1000 MG tablet Take 1,000 mg by mouth 2 (two) times daily with a meal.   nitroGLYCERIN (NITROSTAT) 0.4 MG SL tablet Place 1 tablet (0.4 mg total) under the tongue every 5 (five) minutes as needed for chest pain.   tamsulosin (FLOMAX) 0.4 MG CAPS capsule Take 0.4  mg by mouth daily.   ticagrelor (BRILINTA) 90 MG TABS tablet Take 1 tablet (90 mg total) by mouth 2 (two) times daily.     Allergies:   Patient has no known allergies.   Social History   Socioeconomic History   Marital status: Married    Spouse name: Almyra Free   Number of children: Not on file   Years of education: Not on file   Highest education level: Not on file  Occupational History   Not on file  Tobacco Use   Smoking status: Never   Smokeless tobacco: Never  Vaping Use   Vaping Use: Never  used  Substance and Sexual Activity   Alcohol use: Never   Drug use: Never   Sexual activity: Not Currently  Other Topics Concern   Not on file  Social History Narrative   Not on file   Social Determinants of Health   Financial Resource Strain: Not on file  Food Insecurity: Not on file  Transportation Needs: Not on file  Physical Activity: Not on file  Stress: Not on file  Social Connections: Not on file     Family History:  The patient's  family history includes Breast cancer in his mother.   ROS:   Please see the history of present illness.    ROS All other systems reviewed and are negative.   PHYSICAL EXAM:   VS:  BP 128/72    Pulse 78    Ht 5' 8"  (1.727 m)    Wt 178 lb 12.8 oz (81.1 kg)    SpO2 98%    BMI 27.19 kg/m   Physical Exam  GEN: Well nourished, well developed, in no acute distress  Neck: no JVD, carotid bruits, or masses Cardiac:RRR; no murmurs, rubs, or gallops  Respiratory:  clear to auscultation bilaterally, normal work of breathing GI: soft, nontender, nondistended, + BS Ext: right arm at cath site without hematoma or hemorrhage, lower ext without cyanosis, clubbing, or edema, Good distal pulses bilaterally Neuro:  Alert and Oriented x 3, Psych: euthymic mood, full affect  Wt Readings from Last 3 Encounters:  05/11/21 178 lb 12.8 oz (81.1 kg)  04/28/21 175 lb (79.4 kg)  04/01/21 185 lb (83.9 kg)      Studies/Labs Reviewed:   EKG:  EKG is not ordered today.    Recent Labs: 04/29/2021: B Natriuretic Peptide 102.1 04/30/2021: BUN 11; Creatinine, Ser 1.47; Hemoglobin 10.6; Platelets 203; Potassium 4.0; Sodium 138   Lipid Panel    Component Value Date/Time   CHOL 148 04/29/2021 0213   TRIG 83 04/29/2021 0213   HDL 40 (L) 04/29/2021 0213   CHOLHDL 3.7 04/29/2021 0213   VLDL 17 04/29/2021 0213   LDLCALC 91 04/29/2021 0213    Additional studies/ records that were reviewed today include:  Echocardiogram 04/29/2021: 1. Left ventricular ejection  fraction, by estimation, is 60 to 65%. The  left ventricle has normal function. The left ventricle has no regional  wall motion abnormalities. Left ventricular diastolic parameters are  indeterminate. Elevated left ventricular  end-diastolic pressure.   2. Right ventricular systolic function is normal. The right ventricular  size is normal. Tricuspid regurgitation signal is inadequate for assessing  PA pressure.   3. The mitral valve is normal in structure. Mild mitral valve  regurgitation. No evidence of mitral stenosis.   4. The aortic valve is normal in structure. Aortic valve regurgitation is  trivial. No aortic stenosis is present.   5. The inferior vena cava is  normal in size with greater than 50%  respiratory variability, suggesting right atrial pressure of 3 mmHg.    LHC 04/29/2021:   Dist RCA lesion is 99% stenosed.   1st Mrg-1 lesion is 60% stenosed.   Prox Cx to Mid Cx lesion is 40% stenosed.   1st Mrg-2 lesion is 80% stenosed.   Lat 1st Mrg lesion is 80% stenosed.   Prox LAD to Mid LAD lesion is 50% stenosed.   A drug-eluting stent was successfully placed using a STENT ONYX FRONTIER 2.5X12.   Post intervention, there is a 0% residual stenosis.   The LAD is a large caliber vessel that courses to the apex. The mid LAD has moderate stenosis. RFR 0.92 suggesting this lesion is not flow limiting.  The Circumflex is a large vessel. The first obtuse marginal branch is a large bifurcating vessel. The proximal segment of the obtuse marginal branch has a moderate stenosis. The vessel then bifurcates. There is diffuse severe disease in both distal sub-branches of the obtuse marginal branch.  The RCA is a large dominant vessel with severe stenosis just before the bifurcation. This is felt to be the culprit lesion. Successful PTCA/DES x 1 distal RCA Mild elevation LV filling pressures   Recommendations: DAPT with ASA and Brilinta for one year. High intensity statin. Beta blocker as  tolerated. Echo is pending. Recheck renal function tomorrow. Medical management of residual CAD in the Circumflex artery. Case reviewed with IC team.    Diagnostic Dominance: Right Intervention   Implants        Risk Assessment/Calculations:         ASSESSMENT:    1. Coronary artery disease involving native coronary artery of native heart without angina pectoris   2. Essential hypertension   3. History of CVA (cerebrovascular accident)   4. Type 2 diabetes mellitus with hemoglobin A1c goal of less than 7.0% (HCC)   5. Hyperlipidemia, unspecified hyperlipidemia type   6. Stage 3a chronic kidney disease (CKD) ([place])   7. Cough, unspecified type      PLAN:  In order of problems listed above:  CAD NSTEMI treated with DES to the RCA with residual multivessel CAD as listed above treated medically.  On aspirin and Brilinta for at least 1 year.  Normal LVEF 60 to 65%-no angina  Hypertension poorly controlled in the hospital carvedilol 25 mg twice daily added in addition to amlodipine. Now controlled  Recent CVA  Diabetes mellitus A1c 6.0-decrease sugars in diet  Hyperlipidemia LDL 91 atorvastatin restarted-recheck at 2 month f/u  CKD stage IIIa creatinine 1.47 at discharge recheck today  Cough/congestion-lungs clear-I asked him to test himself for covid 19 when he gets home. Muccinex, f/u with PCP if no better.  Shared Decision Making/Informed Consent        Medication Adjustments/Labs and Tests Ordered: Current medicines are reviewed at length with the patient today.  Concerns regarding medicines are outlined above.  Medication changes, Labs and Tests ordered today are listed in the Patient Instructions below. Patient Instructions  Medication Instructions:  Your physician recommends that you continue on your current medications as directed. Please refer to the Current Medication list given to you today. *If you need a refill on your cardiac medications before your next  appointment, please call your pharmacy*  Lab Work: Your physician recommends that you return for lab work in: [place] If you have labs (blood work) drawn today and your  tests are completely normal, you will receive your results only by: MyChart Message (if you have MyChart) OR A paper copy in the mail If you have any lab test that is abnormal or we need to change your treatment, we will call you to review the results.  Testing/Procedures: NONE ORDERED  Follow-Up: At [facility], you and your health needs are our priority.  As part of our continuing mission to provide you with exceptional heart care, we have created designated Provider Care Teams.  These Care Teams include your primary Cardiologist (physician) and Advanced Practice Providers (APPs -  Physician Assistants and Nurse Practitioners) who all work together to provide you with the care you need, when you need it.  We recommend signing up for the patient portal called "MyChart".  Sign up information is provided on this After Visit Summary.  MyChart is used to connect with patients for Virtual Visits (Telemedicine).  Patients are able to view lab/test results, encounter notes, upcoming appointments, etc.  Non-urgent messages can be sent to your provider as well.   To learn more about what you can do with MyChart, go to [email or web address].    Your next appointment:   2 month(s)  The format for your next appointment:   In Person  Provider:   Sinclair Grooms, MD    Other Instructions     Signed, Ermalinda Barrios, [name]  05/11/2021 12:41 PM    [address], [place], [place]  [address] Phone: [phone]; Fax: [phone]

## 2021-05-09 ENCOUNTER — Telehealth (HOSPITAL_COMMUNITY): Payer: Self-pay

## 2021-05-09 NOTE — Telephone Encounter (Signed)
Pt insurance is active and benefits verified through [facility] Co-pay $5, DED $226/0 met, out of pocket $12,450/0 met, co-insurance 0%. no pre-authorization required. OUT OF NETWORK, 05/09/2021_0 :06PM, REF# [id number]   Will contact patient to see if he is interested in the Cardiac Rehab Program. If interested, patient will need to complete follow up appt. Once completed, patient will be contacted for scheduling upon review by the RN Navigator.

## 2021-05-09 NOTE — Telephone Encounter (Signed)
Attempted to call patient in regards to Cardiac Rehab - Left a message with pt's wife for pt to call back. Was unable to speak with pt because pt does not have a DPR on file to speak with anybody else besides him legally.

## 2021-05-09 NOTE — Telephone Encounter (Signed)
Pt called back in regards to cardiac rehab. Pt stated that he was not sure if he can do it do [facility]. Pt's wife intervened and stated that he will possibly be interested once he completes his f/u with his cardiologist. I asked pt which insurance would the pt like to use she stated [facility], I advised pt's wife that they would have to get in touch with the [place] and get them to send over an authorization for cardiac rehab if he would like to use the [place] for coverage pt's wife understood. Will f/u once pt has completed his f/u.

## 2021-05-11 ENCOUNTER — Encounter: Payer: Self-pay | Admitting: Physician Assistant

## 2021-05-11 ENCOUNTER — Other Ambulatory Visit: Payer: Self-pay

## 2021-05-11 ENCOUNTER — Ambulatory Visit (INDEPENDENT_AMBULATORY_CARE_PROVIDER_SITE_OTHER): Payer: No Typology Code available for payment source | Admitting: Physician Assistant

## 2021-05-11 VITALS — BP 128/72 | HR 78 | Ht 68.0 in | Wt 178.8 lb

## 2021-05-11 DIAGNOSIS — I2510 Atherosclerotic heart disease of native coronary artery without angina pectoris: Secondary | ICD-10-CM

## 2021-05-11 DIAGNOSIS — E119 Type 2 diabetes mellitus without complications: Secondary | ICD-10-CM | POA: Diagnosis not present

## 2021-05-11 DIAGNOSIS — Z8673 Personal history of transient ischemic attack (TIA), and cerebral infarction without residual deficits: Secondary | ICD-10-CM | POA: Diagnosis not present

## 2021-05-11 DIAGNOSIS — I10 Essential (primary) hypertension: Secondary | ICD-10-CM

## 2021-05-11 DIAGNOSIS — N1831 Chronic kidney disease, stage 3a: Secondary | ICD-10-CM

## 2021-05-11 DIAGNOSIS — R059 Cough, unspecified: Secondary | ICD-10-CM

## 2021-05-11 DIAGNOSIS — E785 Hyperlipidemia, unspecified: Secondary | ICD-10-CM

## 2021-05-11 NOTE — Patient Instructions (Addendum)
Medication Instructions:  Your physician recommends that you continue on your current medications as directed. Please refer to the Current Medication list given to you today. *If you need a refill on your cardiac medications before your next appointment, please call your pharmacy*  Lab Work: Your physician recommends that you return for lab work in: [place] If you have labs (blood work) drawn today and your tests are completely normal, you will receive your results only by: [place] (if you have MyChart) OR A paper copy in the mail If you have any lab test that is abnormal or we need to change your treatment, we will call you to review the results.  Testing/Procedures: NONE ORDERED  Follow-Up: At [facility], you and your health needs are our priority.  As part of our continuing mission to provide you with exceptional heart care, we have created designated Provider Care Teams.  These Care Teams include your primary Cardiologist (physician) and Advanced Practice Providers (APPs -  Physician Assistants and Nurse Practitioners) who all work together to provide you with the care you need, when you need it.  We recommend signing up for the patient portal called "MyChart".  Sign up information is provided on this After Visit Summary.  MyChart is used to connect with patients for Virtual Visits (Telemedicine).  Patients are able to view lab/test results, encounter notes, upcoming appointments, etc.  Non-urgent messages can be sent to your provider as well.   To learn more about what you can do with MyChart, go to [email or web address].    Your next appointment:   2 month(s)  The format for your next appointment:   In Person  Provider:   Sinclair Grooms, MD    Other Instructions

## 2021-05-12 ENCOUNTER — Other Ambulatory Visit: Payer: Self-pay

## 2021-05-12 DIAGNOSIS — N1831 Chronic kidney disease, stage 3a: Secondary | ICD-10-CM

## 2021-05-12 LAB — BASIC METABOLIC PANEL
BUN/Creatinine Ratio: 13 (ref 10–24)
BUN: 21 mg/dL (ref 8–27)
CO2: 20 mmol/L (ref 20–29)
Calcium: 9 mg/dL (ref 8.6–10.2)
Chloride: 104 mmol/L (ref 96–106)
Creatinine, Ser: 1.63 mg/dL — ABNORMAL HIGH (ref 0.76–1.27)
Glucose: 101 mg/dL — ABNORMAL HIGH (ref 70–99)
Potassium: 3.9 mmol/L (ref 3.5–5.2)
Sodium: 142 mmol/L (ref 134–144)
eGFR: 46 mL/min/{1.73_m2} — ABNORMAL LOW (ref >59)

## 2021-05-12 MED ORDER — FUROSEMIDE 20 MG PO TABS: 20.0000 mg | ORAL_TABLET | ORAL | 3 refills | Status: DC | PRN

## 2021-05-26 ENCOUNTER — Other Ambulatory Visit: Payer: Medicare (Managed Care)

## 2021-05-26 ENCOUNTER — Ambulatory Visit: Payer: Self-pay

## 2021-05-26 ENCOUNTER — Other Ambulatory Visit: Payer: Self-pay

## 2021-05-26 DIAGNOSIS — N1831 Chronic kidney disease, stage 3a: Secondary | ICD-10-CM

## 2021-05-26 LAB — BASIC METABOLIC PANEL
BUN/Creatinine Ratio: 12 (ref 10–24)
BUN: 19 mg/dL (ref 8–27)
CO2: 21 mmol/L (ref 20–29)
Calcium: 9.5 mg/dL (ref 8.6–10.2)
Chloride: 105 mmol/L (ref 96–106)
Creatinine, Ser: 1.58 mg/dL — ABNORMAL HIGH (ref 0.76–1.27)
Glucose: 89 mg/dL (ref 70–99)
Potassium: 3.8 mmol/L (ref 3.5–5.2)
Sodium: 139 mmol/L (ref 134–144)
eGFR: 48 mL/min/{1.73_m2} — ABNORMAL LOW (ref >59)

## 2021-05-26 NOTE — Telephone Encounter (Signed)
°  Chief Complaint: vomiting Symptoms: vomiting x 2 today, diarrhea x 3 days Frequency:  Pertinent Negatives: Patient denies abdominal pain Disposition: [] ED /[] Urgent Care (no appt availability in office) / [] Appointment(In office/virtual)/ []  [facility]/ [x] Home Care/ [] Refused Recommended Disposition /[]  Mobile Bus/ []  Follow-up with PCP Additional Notes: advised pt's wife if pt's symptoms persist to contact pt's PCP.    Summary: Question regarding Pt Vomitting   Pts wife is concerned that the pt vomitted 30 mins ago. Pt had a heartache 04/28/21. And pt reports nauseousness and diarrhea for 3-4 days. Should wife take him to the hosptial      Reason for Disposition  MILD vomiting with diarrhea  Answer Assessment - Initial Assessment Questions 1. VOMITING SEVERITY: "How many times have you vomited in the past 24 hours?"     - MILD:  1 - 2 times/day    - MODERATE: 3 - 5 times/day, decreased oral intake without significant weight loss or symptoms of dehydration    - SEVERE: 6 or more times/day, vomits everything or nearly everything, with significant weight loss, symptoms of dehydration      2 times 2. ONSET: "When did the vomiting begin?"      today 3. FLUIDS: "What fluids or food have you vomited up today?" "Have you been able to keep any fluids down?"     Tolerating fluids 4. ABDOMINAL PAIN: "Are your having any abdominal pain?" If yes : "How bad is it and what does it feel like?" (e.g., crampy, dull, intermittent, constant)      Pt denied 5. DIARRHEA: "Is there any diarrhea?" If Yes, ask: "How many times today?"      Yes 3 days 6. CONTACTS: "Is there anyone else in the family with the same symptoms?"      No 8. HYDRATION STATUS: "Any signs of dehydration?" (e.g., dry mouth [not only dry lips], too weak to stand) "When did you last urinate?"     No 9. OTHER SYMPTOMS: "Do you have any other symptoms?" (e.g., fever, headache, vertigo, vomiting blood or coffee  grounds, recent head injury)     nausea  Protocols used: Vomiting-A-AH

## 2021-06-08 ENCOUNTER — Encounter (HOSPITAL_COMMUNITY): Payer: Self-pay

## 2021-06-08 ENCOUNTER — Telehealth (HOSPITAL_COMMUNITY): Payer: Self-pay

## 2021-06-08 NOTE — Telephone Encounter (Signed)
Attempted to contact pt in regards to CR, LMTCB with pt wife Jerry Moran to have pt contact CR. Will also mail a please contact letter. ?

## 2021-06-16 ENCOUNTER — Telehealth: Payer: Self-pay | Admitting: Interventional Cardiology

## 2021-06-16 NOTE — Telephone Encounter (Signed)
Follow Up: ? ? ?Wife called back to say that you need to send the last office notes along with a referral to Cardiac Rrehab please. This needs to go [facility]. The phone number is [phone], if you have any questions. ?

## 2021-06-16 NOTE — Telephone Encounter (Signed)
Wife of patient called. The Wife said that for the [facility] to cover the cost of the patient's rehab, they will need a copy of the patient's last office visit note sent to the [place].  ? ?The wife will get more information from the [place] and call us back ?

## 2021-06-17 ENCOUNTER — Telehealth: Payer: Self-pay

## 2021-06-17 NOTE — Telephone Encounter (Signed)
Attempted to contact [facility] to get fax number to send records and order to. No answer and no VM option. ?

## 2021-06-17 NOTE — Telephone Encounter (Signed)
NOTES SCANNED TO REFERRAL 

## 2021-06-24 NOTE — Telephone Encounter (Signed)
Attempted to contact [facility] to get fax number to send records and order to. No answer and no VM option. ?

## 2021-06-28 NOTE — Telephone Encounter (Signed)
Called [facility] and spoke with operator.  He transferred me to [facility].  Per their recording, their operating hours are 8:15am-4:15pm.  Phone number is [phone], [phone].   ?

## 2021-06-29 NOTE — Telephone Encounter (Signed)
[place] and obtained fax number [phone].  Faxed over requested information. ?

## 2021-07-11 ENCOUNTER — Telehealth (HOSPITAL_COMMUNITY): Payer: Self-pay | Admitting: *Deleted

## 2021-07-11 NOTE — Telephone Encounter (Signed)
Spoke with Jerry Moran. Appointment confirmed for Jerry Moran tomorrow. Will complete health history in person.Barnet Pall, RN,BSN ?07/11/2021 5:30 PM  ?

## 2021-07-12 ENCOUNTER — Encounter (HOSPITAL_COMMUNITY): Payer: Self-pay

## 2021-07-12 ENCOUNTER — Encounter (HOSPITAL_COMMUNITY)
Admission: RE | Admit: 2021-07-12 | Discharge: 2021-07-12 | Disposition: A | Discharge status: Home / Self Care | Payer: Medicare (Managed Care) | Source: Ambulatory Visit | Attending: Interventional Cardiology | Admitting: Interventional Cardiology

## 2021-07-12 VITALS — BP 144/80 | HR 67 | Ht 67.0 in | Wt 180.3 lb

## 2021-07-12 DIAGNOSIS — I2510 Atherosclerotic heart disease of native coronary artery without angina pectoris: Secondary | ICD-10-CM | POA: Insufficient documentation

## 2021-07-12 DIAGNOSIS — E1165 Type 2 diabetes mellitus with hyperglycemia: Secondary | ICD-10-CM | POA: Diagnosis not present

## 2021-07-12 DIAGNOSIS — I129 Hypertensive chronic kidney disease with stage 1 through stage 4 chronic kidney disease, or unspecified chronic kidney disease: Secondary | ICD-10-CM | POA: Diagnosis not present

## 2021-07-12 DIAGNOSIS — E785 Hyperlipidemia, unspecified: Secondary | ICD-10-CM | POA: Diagnosis not present

## 2021-07-12 DIAGNOSIS — I252 Old myocardial infarction: Secondary | ICD-10-CM | POA: Diagnosis present

## 2021-07-12 DIAGNOSIS — C61 Malignant neoplasm of prostate: Secondary | ICD-10-CM | POA: Insufficient documentation

## 2021-07-12 DIAGNOSIS — Z8673 Personal history of transient ischemic attack (TIA), and cerebral infarction without residual deficits: Secondary | ICD-10-CM | POA: Insufficient documentation

## 2021-07-12 DIAGNOSIS — Z955 Presence of coronary angioplasty implant and graft: Secondary | ICD-10-CM | POA: Diagnosis present

## 2021-07-12 DIAGNOSIS — N183 Chronic kidney disease, stage 3 unspecified: Secondary | ICD-10-CM | POA: Diagnosis not present

## 2021-07-12 DIAGNOSIS — E1122 Type 2 diabetes mellitus with diabetic chronic kidney disease: Secondary | ICD-10-CM | POA: Insufficient documentation

## 2021-07-12 DIAGNOSIS — I214 Non-ST elevation (NSTEMI) myocardial infarction: Secondary | ICD-10-CM

## 2021-07-12 HISTORY — DX: Hyperlipidemia, unspecified: E78.5

## 2021-07-12 HISTORY — DX: Atherosclerotic heart disease of native coronary artery without angina pectoris: I25.10

## 2021-07-12 LAB — GLUCOSE, CAPILLARY: Glucose-Capillary: 156 mg/dL — ABNORMAL HIGH (ref 70–99)

## 2021-07-12 NOTE — Progress Notes (Signed)
Cardiac Individual Treatment Plan ? ?Patient Details  ?Name: Jerry Moran ?MRN: [id number] ?Date of Birth: [birth year] ?Referring Provider:   ?Flowsheet Row CARDIAC REHAB PHASE II ORIENTATION from 07/12/2021 in [place]  ?Referring Provider Daneen Schick, MD  ? ?  ? ? ?Initial Encounter Date:  ?Flowsheet Row CARDIAC REHAB PHASE II ORIENTATION from 07/12/2021 in [place]  ?Date 07/12/21  ? ?  ? ? ?Visit Diagnosis: 04/28/21 NSTEMI ? ?04/29/21 S/P DES RCA ? ?Patient's Home Medications on Admission: ? ?Current Outpatient Medications:  ?  amLODipine (NORVASC) 10 MG tablet, Take 1 tablet (10 mg total) by mouth daily., Disp: 30 tablet, Rfl: 0 ?  atorvastatin (LIPITOR) 80 MG tablet, Take 1 tablet (80 mg total) by mouth at bedtime., Disp: 90 tablet, Rfl: 3 ?  BAYER LOW DOSE 81 MG EC tablet, Take 81 mg by mouth daily. Swallow whole., Disp: , Rfl:  ?  carvedilol (COREG) 25 MG tablet, Take 1 tablet (25 mg total) by mouth 2 (two) times daily with a meal., Disp: 60 tablet, Rfl: 6 ?  chlorthalidone (HYGROTON) 25 MG tablet, Take 25 mg by mouth daily., Disp: , Rfl:  ?  glipiZIDE (GLUCOTROL) 5 MG tablet, Take 5 mg by mouth 2 (two) times daily before a meal., Disp: , Rfl:  ?  lisinopril (ZESTRIL) 40 MG tablet, Take 40 mg by mouth every morning., Disp: , Rfl:  ?  metFORMIN (GLUCOPHAGE) 1000 MG tablet, Take 1,000 mg by mouth 2 (two) times daily with a meal., Disp: , Rfl:  ?  nitroGLYCERIN (NITROSTAT) 0.4 MG SL tablet, Place 1 tablet (0.4 mg total) under the tongue every 5 (five) minutes as needed for chest pain., Disp: 25 tablet, Rfl: 3 ?  tamsulosin (FLOMAX) 0.4 MG CAPS capsule, Take 0.4 mg by mouth daily., Disp: , Rfl:  ?  ticagrelor (BRILINTA) 90 MG TABS tablet, Take 1 tablet (90 mg total) by mouth 2 (two) times daily., Disp: 60 tablet, Rfl: 6 ?  blood glucose meter kit and supplies KIT, Dispense based on patient and insurance preference. Use up to four times daily as  directed. (FOR ICD-9 250.00, 250.01)., Disp: 1 each, Rfl: 0 ?  furosemide (LASIX) 20 MG tablet, Take 1 tablet (20 mg total) by mouth as needed for fluid or edema. (Patient not taking: Reported on 07/08/2021), Disp: 30 tablet, Rfl: 3 ? ?Past Medical History: ?Past Medical History:  ?Diagnosis Date  ? Coronary artery disease   ? Diabetes mellitus without complication ([place])   ? Hyperlipidemia   ? Hypertension   ? Prostate cancer ([place])   ? Stroke [facility])   ? ? ?Tobacco Use: ?Social History  ? ?Tobacco Use  ?Smoking Status Former  ? Types: Cigarettes  ?Smokeless Tobacco Never  ?Tobacco Comments  ? Smoked cigarettes as a teenager  ? ? ?Labs: ?Review Flowsheet   ? ?  ?  Latest Ref Rng & Units 01/14/2019 01/22/2019 04/29/2021  ?Labs for ITP Cardiac and Pulmonary Rehab  ?Cholestrol 0 - 200 mg/dL 252    148    ?LDL (calc) 0 - 99 mg/dL 165    91    ?HDL-C >40 mg/dL 35    40    ?Trlycerides <150 mg/dL 258    83    ?Hemoglobin A1c 4.8 - 5.6 % 11.5    6.0    ?TCO2 22 - 32 mmol/L  24     ?  ? ? Multiple values from one day are  sorted in reverse-chronological order  ?  ?  ? ? ?Capillary Blood Glucose: ?Lab Results  ?Component Value Date  ? GLUCAP 156 (H) 07/12/2021  ? GLUCAP 186 (H) 04/30/2021  ? GLUCAP 159 (H) 04/30/2021  ? GLUCAP 152 (H) 04/29/2021  ? GLUCAP 273 (H) 04/29/2021  ? ? ? ?Exercise Target Goals: ?Exercise Program Goal: ?Individual exercise prescription set using results from initial 6 min walk test and THRR while considering  patient?s activity barriers and safety.  ? ?Exercise Prescription Goal: ?Starting with aerobic activity 30 plus minutes a day, 3 days per week for initial exercise prescription. Provide home exercise prescription and guidelines that participant acknowledges understanding prior to discharge. ? ?Activity Barriers & Risk Stratification: ? Activity Barriers & Cardiac Risk Stratification - 07/12/21 1208   ? ?  ? Activity Barriers & Cardiac Risk Stratification  ? Activity Barriers Deconditioning;Muscular  Weakness;Balance Concerns;Assistive Device;Other (comment)   ? 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 Cardiac Risk Stratification High   ? ?  ?  ? ?  ? ? ?6 Minute Walk: ? 6 Minute Walk   ? ? [place] Name 07/12/21 1103  ?  ?  ?  ? 6 Minute Walk  ? Distance 1312 feet  Performed on N[place]    ? Walk Time 6 minutes    ? # of Rest Breaks 0    ? MPH 2.49    ? METS 2.94    ? RPE 11    ? Perceived Dyspnea  0    ? VO2 Peak 10.3    ? Symptoms No    ? Resting HR 75 bpm    ? Resting BP 144/80    ? Resting Oxygen Saturation  100 %    ? Exercise Oxygen Saturation  during 6 min walk 100 %    ? Max Ex. HR 84 bpm    ? Max Ex. BP 148/82    ? 2 Minute Post BP 120/74    ? ?  ?  ? ?  ? ? ?Oxygen Initial Assessment: ? ? ?Oxygen Re-Evaluation: ? ? ?Oxygen Discharge (Final Oxygen Re-Evaluation): ? ? ?Initial Exercise Prescription: ? Initial Exercise Prescription - 07/12/21 1200   ? ?  ? Date of Initial Exercise RX and Referring Provider  ? Date 07/12/21   ? Referring Provider Daneen Schick, MD   ? Expected Discharge Date 09/09/21   ?  ? NuStep  ? Level 2   ? SPM 75   ? Minutes 30   ? METs 2   ?  ? Prescription Details  ? Frequency (times per week) 3   ? Duration Progress to 30 minutes of continuous aerobic without signs/symptoms of physical distress   ?  ? Intensity  ? THRR 40-80% of Max Heartrate 61-122   ? Ratings of Perceived Exertion 11-13   ? Perceived Dyspnea 0-4   ?  ? Progression  ? Progression Continue progressive overload as per policy without signs/symptoms or physical distress.   ?  ? Resistance Training  ? Training Prescription Yes   ? Weight 3 lbs   ? Reps 10-15   ? ?  ?  ? ?  ? ? ?Perform Capillary Blood Glucose checks as needed. ? ?Exercise Prescription Changes: ? ? ?Exercise Comments: ? ? ?Exercise Goals and Review: ? ? Exercise Goals   ? ? [place] Name 07/12/21 1110  ?  ?  ?  ?  ?  ? Exercise  Goals  ? Increase Physical Activity Yes      ? Intervention Provide advice, education, support and  counseling about physical activity/exercise needs.;Develop an individualized exercise prescription for aerobic and resistive training based on initial evaluation findings, risk stratification, comorbidities and participant's personal goals.      ? Expected Outcomes Short Term: Attend rehab on a regular basis to increase amount of physical activity.;Long Term: Add in home exercise to make exercise part of routine and to increase amount of physical activity.;Long Term: Exercising regularly at least 3-5 days a week.      ? Increase Strength and Stamina Yes      ? Intervention Provide advice, education, support and counseling about physical activity/exercise needs.;Develop an individualized exercise prescription for aerobic and resistive training based on initial evaluation findings, risk stratification, comorbidities and participant's personal goals.      ? Expected Outcomes Short Term: Increase workloads from initial exercise prescription for resistance, speed, and METs.;Short Term: Perform resistance training exercises routinely during rehab and add in resistance training at home;Long Term: Improve cardiorespiratory fitness, muscular endurance and strength as measured by increased METs and functional capacity (6MWT)      ? Able to understand and use rate of perceived exertion (RPE) scale Yes      ? Intervention Provide education and explanation on how to use RPE scale      ? Expected Outcomes Short Term: Able to use RPE daily in rehab to express subjective intensity level;Long Term:  Able to use RPE to guide intensity level when exercising independently      ? Intervention Provide education and explanation of THRR including how the numbers were predicted and where they are located for reference      ? Expected Outcomes Short Term: Able to use daily as guideline for intensity in rehab;Long Term: Able to use THRR to govern intensity when exercising independently      ? Understanding of Exercise Prescription Yes      ?  Intervention Provide education, explanation, and written materials on patient's individual exercise prescription      ? Expected Outcomes Short Term: Able to explain program exercise prescription;Long Term: A

## 2021-07-12 NOTE — Progress Notes (Addendum)
Cardiac Rehab Medication Review by a Nurse ? ?Does the patient  feel that his/her medications are working for him/her?  yes ? ?Has the patient been experiencing any side effects to the medications prescribed?  no ? ?Does the patient measure his/her own blood pressure or blood glucose at home?  sometimes  ? ?Does the patient have any problems obtaining medications due to transportation or finances?   no ? ?Understanding of regimen: poor ?Understanding of indications: poor ?Potential of compliance: fair ? ? ? ?Nurse comments: [name] says he is taking his medications as prescribed. I spoke with the patient's wife who says he puts his medications in a pill dispenser and is not sure that he always takes his medications as prescribed. Will re enforce the importance of taking his medications. Patient does check his CBG's or blood pressures on a regular basis.   ? ? ? ?Jerry Gave RN ?07/12/2021 8:49 AM ?  ?

## 2021-07-18 ENCOUNTER — Encounter (HOSPITAL_COMMUNITY)
Admission: RE | Admit: 2021-07-18 | Discharge: 2021-07-18 | Disposition: A | Discharge status: Home / Self Care | Payer: Medicare (Managed Care) | Source: Ambulatory Visit | Attending: Interventional Cardiology | Admitting: Interventional Cardiology

## 2021-07-18 DIAGNOSIS — I252 Old myocardial infarction: Secondary | ICD-10-CM | POA: Diagnosis not present

## 2021-07-18 DIAGNOSIS — I214 Non-ST elevation (NSTEMI) myocardial infarction: Secondary | ICD-10-CM

## 2021-07-18 DIAGNOSIS — Z955 Presence of coronary angioplasty implant and graft: Secondary | ICD-10-CM

## 2021-07-18 LAB — GLUCOSE, CAPILLARY
Glucose-Capillary: 143 mg/dL — ABNORMAL HIGH (ref 70–99)
Glucose-Capillary: 143 mg/dL — ABNORMAL HIGH (ref 70–99)

## 2021-07-18 NOTE — Progress Notes (Addendum)
Daily Session Note ? ?Patient Details  ?Name: Jerry Moran ?MRN: [id number] ?Date of Birth: January 23, 1956 ?Referring Provider:   ?Flowsheet Row CARDIAC REHAB PHASE II ORIENTATION from 07/12/2021 in [place]  ?Referring Provider Daneen Schick, MD  ? ?  ? ? ?Encounter Date: 07/18/2021 ? ?Check In: ? Session Check In - 07/18/21 0838   ? ?  ? Check-In  ? Supervising physician immediately available to respond to emergencies [facility] immediately available   ? Physician(s) Dr Pietro Cassis   ? Location [facility]   ? Staff Present Barnet Pall, RN, Milus Glazier, MS, ACSM-CEP, CCRP, Exercise Physiologist;Olinty Celesta Aver, MS, ACSM CEP, Exercise Physiologist;Jetta Gilford Rile BS, ACSM EP-C, Exercise Physiologist;Carlette Wilber [place], Therapist, sports, BSN   ? Virtual Visit No   ? Medication changes reported     No   ? Fall or balance concerns reported    No   ? Tobacco Cessation No Change   ? Current number of cigarettes/nicotine per day     0   ? Warm-up and Cool-down Performed as group-led instruction   ? Resistance Training Performed Yes   ? VAD Patient? No   ? PAD/SET Patient? No   ?  ? Pain Assessment  ? Currently in Pain? No/denies   ? Pain Score 0-No pain   ? Multiple Pain Sites No   ? ?  ?  ? ?  ? ? ?Capillary Blood Glucose: ?Results for orders placed or performed during the hospital encounter of 07/18/21 (from the past 24 hour(s))  ?Glucose, capillary     Status: Abnormal  ? Collection Time: 07/18/21  9:18 AM  ?Result Value Ref Range  ? Glucose-Capillary 143 (H) 70 - 99 mg/dL  ? ? ? Exercise Prescription Changes - 07/18/21 1000   ? ?  ? Response to Exercise  ? Blood Pressure (Admit) 128/72   ? Blood Pressure (Exercise) 134/72   ? Blood Pressure (Exit) 130/72   ? Heart Rate (Admit) 70 bpm   ? Heart Rate (Exercise) 88 bpm   ? Heart Rate (Exit) 63 bpm   ? Rating of Perceived Exertion (Exercise) 11   ? Symptoms None   ? Comments Pt's first day in the CRP2 program   ? Duration Continue  with 30 min of aerobic exercise without signs/symptoms of physical distress.   ? Intensity THRR unchanged   ?  ? Progression  ? Progression Continue to progress workloads to maintain intensity without signs/symptoms of physical distress.   ? Average METs 1.4   ?  ? Resistance Training  ? Training Prescription Yes   ? Weight 3 lbs   ? Reps 10-15   ? Time 10 Minutes   ?  ? Interval Training  ? Interval Training No   ?  ? NuStep  ? Level 2   ? SPM 58   ? Minutes 30   ? METs 1.4   ? ?  ?  ? ?  ? ? ?Social History  ? ?Tobacco Use  ?Smoking Status Former  ? Types: Cigarettes  ?Smokeless Tobacco Never  ?Tobacco Comments  ? Smoked cigarettes as a teenager  ? ? ?Goals Met:  ?Exercise tolerated well ?No report of concerns or symptoms today ?Strength training completed today ? ?Goals Unmet:  ?Not Applicable ? ?Comments: Pt started cardiac rehab today.  Pt tolerated light exercise without difficulty. VSS, telemetry-Sinus Rhythm, asymptomatic.  Medication list reconciled. Pt denies barriers to medicaiton compliance.  PSYCHOSOCIAL ASSESSMENT:  PHQ-0. Pt exhibits  positive coping skills, hopeful outlook with supportive family. No psychosocial needs identified at this time, no psychosocial interventions necessary.    Pt enjoys watching sports.   Pt oriented to exercise equipment and routine.    Understanding verbalized. Marissa used a cane for stability due to his history of a CVA. ?QUALITY OF LIFE SCORE REVIEW ? Pt completed Quality of Life survey as a participant in Cardiac Rehab.  Scores 21.0 or below are considered low.  Pt score very low in health and functioning and psychosocial spiritual Overall 20.73, Health and Function 16.57, socioeconomic 23.64, physiological and spiritual 20.71, family 28.30. Patient quality of life slightly altered by physical constraints which limits ability to perform as prior to recent cardiac illness.Jerry Moran feels he cant do the things he used to do before his CVA and that his wife Jerry Moran does a for  him and their adopted daughter who has special needs.  Offered emotional support and reassurance.  Will continue to monitor and intervene as necessary. Jerry Moran denies being depressed.Barnet Pall, RN,BSN ?07/18/2021 12:02 PM  ? ? ? ?Dr. Fransico Him is Medical Director for Cardiac Rehab at [facility]. ?

## 2021-07-20 ENCOUNTER — Encounter (HOSPITAL_COMMUNITY)
Admission: RE | Admit: 2021-07-20 | Discharge: 2021-07-20 | Disposition: A | Discharge status: Home / Self Care | Payer: Medicare (Managed Care) | Source: Ambulatory Visit | Attending: Interventional Cardiology | Admitting: Interventional Cardiology

## 2021-07-20 DIAGNOSIS — I214 Non-ST elevation (NSTEMI) myocardial infarction: Secondary | ICD-10-CM

## 2021-07-20 DIAGNOSIS — Z955 Presence of coronary angioplasty implant and graft: Secondary | ICD-10-CM

## 2021-07-20 DIAGNOSIS — I252 Old myocardial infarction: Secondary | ICD-10-CM | POA: Diagnosis not present

## 2021-07-20 LAB — GLUCOSE, CAPILLARY
Glucose-Capillary: 203 mg/dL — ABNORMAL HIGH (ref 70–99)
Glucose-Capillary: 232 mg/dL — ABNORMAL HIGH (ref 70–99)

## 2021-07-20 NOTE — Progress Notes (Addendum)
Jerry Moran 66 y.o. male ?Nutrition Note ?Jerry Moran is motivated to make lifestyle changes to aid with cardiac  rehab. Patient has medical history of TIA, CVA, HTN, NSTEMI, CAD, DM2, malignant neoplasm of prostate, CKD3, hyperlipidemia. He lives at home with his wife. He does a lot of the cooking. He does report decrease appetite since his stroke but is eating three meals per day. He was formerly in [employer] and has four adult children.  ? ?Labs: HDL 40, A1c 6.0 ? ?24 hour diet recall: ?Breakfast: 2 pieces of toast + coffee with cream and sugar ?Lunch: grits, bacon, eggs ?Dinner: salad, fish  ? ?Nutrition Diagnosis ?Food-and nutrition-related knowledge deficit related to lack of exposure to information as related to diagnosis of: DM2, HTN ? ?Nutrition Intervention ?Pt?s individual nutrition plan reviewed with pt. ?Benefits of adopting Heart Healthy diet discussed.   ?Continue client-centered nutrition education by RD, as part of interdisciplinary care. ? ?Monitor/Evaluation: ?Patient reports motivation to make lifestyle changes for adherence to heart healthy diet recommendation and blood sugar control. We discussed decreasing saturated fat and high sodium foods such as breakfast meats and prioritizing fruits/vegetables. He enjoys a wide variety of foods and was very receptive to make changes. Patient amicable to RD suggestions and verbalizes understanding. Will continue to address fiber intake, sodium intake, and lean protein sources at follow-up.  ? ?10 minutes spent in review of topics related to a heart healthy diet including sodium intake, blood sugar control, weight management, and fiber intake. ? ?Goal(s) ?Decrease bacon/sausage to <3x/week (veggie omlet, lean ham, etc)  ?Prioritize fruit and vegetable intake at meals and snacks.  ?Pt to identify and limit food sources of saturated fat, trans fat, refined carbohydrates and sodium ?Pt able to name foods that affect blood glucose. Continue to limit  simple sugars, refined carbohydrates, sugary beverages, etc.  ?Pt to describe the benefit of including lean protein/plant proteins, fruits, vegetables, whole grains, nuts/seeds, and low-fat dairy products in a heart healthy meal plan. ? ?Plan:  ?Will provide client-centered nutrition education as part of interdisciplinary care ?Monitor and evaluate progress toward nutrition goal with team. ? ? ?Aldona Bar [place], MS, RDN, LDN ? ?

## 2021-07-22 ENCOUNTER — Encounter (HOSPITAL_COMMUNITY)
Admission: RE | Admit: 2021-07-22 | Discharge: 2021-07-22 | Disposition: A | Discharge status: Home / Self Care | Payer: Medicare (Managed Care) | Source: Ambulatory Visit | Attending: Interventional Cardiology | Admitting: Interventional Cardiology

## 2021-07-22 DIAGNOSIS — I214 Non-ST elevation (NSTEMI) myocardial infarction: Secondary | ICD-10-CM

## 2021-07-22 DIAGNOSIS — I252 Old myocardial infarction: Secondary | ICD-10-CM | POA: Diagnosis not present

## 2021-07-22 DIAGNOSIS — Z955 Presence of coronary angioplasty implant and graft: Secondary | ICD-10-CM

## 2021-07-22 LAB — GLUCOSE, CAPILLARY
Glucose-Capillary: 97 mg/dL (ref 70–99)
Glucose-Capillary: 117 mg/dL — ABNORMAL HIGH (ref 70–99)

## 2021-07-25 ENCOUNTER — Encounter (HOSPITAL_COMMUNITY)
Admission: RE | Admit: 2021-07-25 | Discharge: 2021-07-25 | Disposition: A | Discharge status: Home / Self Care | Payer: Medicare (Managed Care) | Source: Ambulatory Visit | Attending: Interventional Cardiology | Admitting: Interventional Cardiology

## 2021-07-25 DIAGNOSIS — I214 Non-ST elevation (NSTEMI) myocardial infarction: Secondary | ICD-10-CM

## 2021-07-25 DIAGNOSIS — Z955 Presence of coronary angioplasty implant and graft: Secondary | ICD-10-CM

## 2021-07-25 DIAGNOSIS — I252 Old myocardial infarction: Secondary | ICD-10-CM | POA: Diagnosis not present

## 2021-07-25 LAB — GLUCOSE, CAPILLARY: Glucose-Capillary: 141 mg/dL — ABNORMAL HIGH (ref 70–99)

## 2021-07-27 ENCOUNTER — Encounter (HOSPITAL_COMMUNITY)
Admission: RE | Admit: 2021-07-27 | Discharge: 2021-07-27 | Disposition: A | Discharge status: Home / Self Care | Payer: Medicare (Managed Care) | Source: Ambulatory Visit | Attending: Interventional Cardiology | Admitting: Interventional Cardiology

## 2021-07-27 DIAGNOSIS — I252 Old myocardial infarction: Secondary | ICD-10-CM | POA: Diagnosis not present

## 2021-07-27 DIAGNOSIS — I214 Non-ST elevation (NSTEMI) myocardial infarction: Secondary | ICD-10-CM

## 2021-07-27 DIAGNOSIS — Z955 Presence of coronary angioplasty implant and graft: Secondary | ICD-10-CM

## 2021-07-27 LAB — GLUCOSE, CAPILLARY: Glucose-Capillary: 112 mg/dL — ABNORMAL HIGH (ref 70–99)

## 2021-07-29 ENCOUNTER — Encounter (HOSPITAL_COMMUNITY)
Admission: RE | Admit: 2021-07-29 | Discharge: 2021-07-29 | Disposition: A | Discharge status: Home / Self Care | Payer: Medicare (Managed Care) | Source: Ambulatory Visit | Attending: Interventional Cardiology | Admitting: Interventional Cardiology

## 2021-07-29 DIAGNOSIS — I252 Old myocardial infarction: Secondary | ICD-10-CM | POA: Diagnosis not present

## 2021-07-29 DIAGNOSIS — Z955 Presence of coronary angioplasty implant and graft: Secondary | ICD-10-CM

## 2021-07-29 DIAGNOSIS — I214 Non-ST elevation (NSTEMI) myocardial infarction: Secondary | ICD-10-CM

## 2021-07-29 LAB — GLUCOSE, CAPILLARY: Glucose-Capillary: 128 mg/dL — ABNORMAL HIGH (ref 70–99)

## 2021-08-01 ENCOUNTER — Telehealth (HOSPITAL_COMMUNITY): Payer: Self-pay

## 2021-08-01 ENCOUNTER — Encounter (HOSPITAL_COMMUNITY)
Admission: RE | Admit: 2021-08-01 | Discharge: 2021-08-01 | Disposition: A | Discharge status: Home / Self Care | Payer: No Typology Code available for payment source | Source: Ambulatory Visit | Attending: Interventional Cardiology | Admitting: Interventional Cardiology

## 2021-08-01 DIAGNOSIS — I214 Non-ST elevation (NSTEMI) myocardial infarction: Secondary | ICD-10-CM

## 2021-08-01 DIAGNOSIS — I252 Old myocardial infarction: Secondary | ICD-10-CM | POA: Insufficient documentation

## 2021-08-01 DIAGNOSIS — Z955 Presence of coronary angioplasty implant and graft: Secondary | ICD-10-CM | POA: Insufficient documentation

## 2021-08-03 ENCOUNTER — Encounter (HOSPITAL_COMMUNITY)
Admission: RE | Admit: 2021-08-03 | Discharge: 2021-08-03 | Disposition: A | Discharge status: Home / Self Care | Payer: No Typology Code available for payment source | Source: Ambulatory Visit | Attending: Interventional Cardiology | Admitting: Interventional Cardiology

## 2021-08-03 DIAGNOSIS — I252 Old myocardial infarction: Secondary | ICD-10-CM | POA: Diagnosis not present

## 2021-08-03 DIAGNOSIS — Z955 Presence of coronary angioplasty implant and graft: Secondary | ICD-10-CM

## 2021-08-03 DIAGNOSIS — I214 Non-ST elevation (NSTEMI) myocardial infarction: Secondary | ICD-10-CM

## 2021-08-05 ENCOUNTER — Encounter (HOSPITAL_COMMUNITY)
Admission: RE | Admit: 2021-08-05 | Discharge: 2021-08-05 | Disposition: A | Discharge status: Home / Self Care | Payer: No Typology Code available for payment source | Source: Ambulatory Visit | Attending: Interventional Cardiology | Admitting: Interventional Cardiology

## 2021-08-05 DIAGNOSIS — Z955 Presence of coronary angioplasty implant and graft: Secondary | ICD-10-CM

## 2021-08-05 DIAGNOSIS — I214 Non-ST elevation (NSTEMI) myocardial infarction: Secondary | ICD-10-CM

## 2021-08-05 DIAGNOSIS — I252 Old myocardial infarction: Secondary | ICD-10-CM | POA: Diagnosis not present

## 2021-08-08 ENCOUNTER — Encounter (HOSPITAL_COMMUNITY)
Admission: RE | Admit: 2021-08-08 | Discharge: 2021-08-08 | Disposition: A | Discharge status: Home / Self Care | Payer: No Typology Code available for payment source | Source: Ambulatory Visit | Attending: Interventional Cardiology | Admitting: Interventional Cardiology

## 2021-08-08 DIAGNOSIS — I252 Old myocardial infarction: Secondary | ICD-10-CM | POA: Diagnosis not present

## 2021-08-08 DIAGNOSIS — Z955 Presence of coronary angioplasty implant and graft: Secondary | ICD-10-CM

## 2021-08-08 DIAGNOSIS — I214 Non-ST elevation (NSTEMI) myocardial infarction: Secondary | ICD-10-CM

## 2021-08-09 NOTE — Progress Notes (Signed)
Cardiac Individual Treatment Plan ? ?Patient Details  ?Name: Jerry Moran ?MRN: [id number] ?Date of Birth: 07-24-1955 ?Referring Provider:   ?Flowsheet Row CARDIAC REHAB PHASE II ORIENTATION from 07/12/2021 in [place]  ?Referring Provider Daneen Schick, MD  ? ?  ? ? ?Initial Encounter Date:  ?Flowsheet Row CARDIAC REHAB PHASE II ORIENTATION from 07/12/2021 in [place]  ?Date 07/12/21  ? ?  ? ? ?Visit Diagnosis: 04/28/21 NSTEMI ? ?04/29/21 S/P DES RCA ? ?Patient's Home Medications on Admission: ? ?Current Outpatient Medications:  ?  amLODipine (NORVASC) 10 MG tablet, Take 1 tablet (10 mg total) by mouth daily., Disp: 30 tablet, Rfl: 0 ?  atorvastatin (LIPITOR) 80 MG tablet, Take 1 tablet (80 mg total) by mouth at bedtime., Disp: 90 tablet, Rfl: 3 ?  BAYER LOW DOSE 81 MG EC tablet, Take 81 mg by mouth daily. Swallow whole., Disp: , Rfl:  ?  blood glucose meter kit and supplies KIT, Dispense based on patient and insurance preference. Use up to four times daily as directed. (FOR ICD-9 250.00, 250.01)., Disp: 1 each, Rfl: 0 ?  carvedilol (COREG) 25 MG tablet, Take 1 tablet (25 mg total) by mouth 2 (two) times daily with a meal., Disp: 60 tablet, Rfl: 6 ?  chlorthalidone (HYGROTON) 25 MG tablet, Take 25 mg by mouth daily., Disp: , Rfl:  ?  furosemide (LASIX) 20 MG tablet, Take 1 tablet (20 mg total) by mouth as needed for fluid or edema. (Patient not taking: Reported on 07/08/2021), Disp: 30 tablet, Rfl: 3 ?  glipiZIDE (GLUCOTROL) 5 MG tablet, Take 5 mg by mouth 2 (two) times daily before a meal., Disp: , Rfl:  ?  lisinopril (ZESTRIL) 40 MG tablet, Take 40 mg by mouth every morning., Disp: , Rfl:  ?  metFORMIN (GLUCOPHAGE) 1000 MG tablet, Take 1,000 mg by mouth 2 (two) times daily with a meal., Disp: , Rfl:  ?  nitroGLYCERIN (NITROSTAT) 0.4 MG SL tablet, Place 1 tablet (0.4 mg total) under the tongue every 5 (five) minutes as needed for chest pain., Disp:  25 tablet, Rfl: 3 ?  tamsulosin (FLOMAX) 0.4 MG CAPS capsule, Take 0.4 mg by mouth daily., Disp: , Rfl:  ?  ticagrelor (BRILINTA) 90 MG TABS tablet, Take 1 tablet (90 mg total) by mouth 2 (two) times daily., Disp: 60 tablet, Rfl: 6 ? ?Past Medical History: ?Past Medical History:  ?Diagnosis Date  ? Coronary artery disease   ? Diabetes mellitus without complication ([place])   ? Hyperlipidemia   ? Hypertension   ? Prostate cancer ([place])   ? Stroke [facility])   ? ? ?Tobacco Use: ?Social History  ? ?Tobacco Use  ?Smoking Status Former  ? Types: Cigarettes  ?Smokeless Tobacco Never  ?Tobacco Comments  ? Smoked cigarettes as a teenager  ? ? ?Labs: ?Review Flowsheet   ? ?  ?  Latest Ref Rng & Units 01/14/2019 01/22/2019 04/29/2021  ?Labs for ITP Cardiac and Pulmonary Rehab  ?Cholestrol 0 - 200 mg/dL 252    148    ?LDL (calc) 0 - 99 mg/dL 165    91    ?HDL-C >40 mg/dL 35    40    ?Trlycerides <150 mg/dL 258    83    ?Hemoglobin A1c 4.8 - 5.6 % 11.5    6.0    ?TCO2 22 - 32 mmol/L  24     ?  ? ? Multiple values from one day are  sorted in reverse-chronological order  ?  ?  ? ? ?Capillary Blood Glucose: ?Lab Results  ?Component Value Date  ? GLUCAP 128 (H) 07/29/2021  ? GLUCAP 112 (H) 07/27/2021  ? GLUCAP 141 (H) 07/25/2021  ? GLUCAP 117 (H) 07/22/2021  ? GLUCAP 97 07/22/2021  ? ? ? ?Exercise Target Goals: ?Exercise Program Goal: ?Individual exercise prescription set using results from initial 6 min walk test and THRR while considering  patient?s activity barriers and safety.  ? ?Exercise Prescription Goal: ?Starting with aerobic activity 30 plus minutes a day, 3 days per week for initial exercise prescription. Provide home exercise prescription and guidelines that participant acknowledges understanding prior to discharge. ? ?Activity Barriers & Risk Stratification: ? Activity Barriers & Cardiac Risk Stratification - 07/12/21 1208   ? ?  ? Activity Barriers & Cardiac Risk Stratification  ? Activity Barriers Deconditioning;Muscular  Weakness;Balance Concerns;Assistive Device;Other (comment)   ? 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 Cardiac Risk Stratification High   ? ?  ?  ? ?  ? ? ?6 Minute Walk: ? 6 Minute Walk   ? ? [place] Name 07/12/21 1103  ?  ?  ?  ? 6 Minute Walk  ? Distance 1312 feet  Performed on Nust[employer]    ? Walk Time 6 minutes    ? # of Rest Breaks 0    ? MPH 2.49    ? METS 2.94    ? RPE 11    ? Perceived Dyspnea  0    ? VO2 Peak 10.3    ? Symptoms No    ? Resting HR 75 bpm    ? Resting BP 144/80    ? Resting Oxygen Saturation  100 %    ? Exercise Oxygen Saturation  during 6 min walk 100 %    ? Max Ex. HR 84 bpm    ? Max Ex. BP 148/82    ? 2 Minute Post BP 120/74    ? ?  ?  ? ?  ? ? ?Oxygen Initial Assessment: ? ? ?Oxygen Re-Evaluation: ? ? ?Oxygen Discharge (Final Oxygen Re-Evaluation): ? ? ?Initial Exercise Prescription: ? Initial Exercise Prescription - 07/12/21 1200   ? ?  ? Date of Initial Exercise RX and Referring Provider  ? Date 07/12/21   ? Referring Provider Daneen Schick, MD   ? Expected Discharge Date 09/09/21   ?  ? NuStep  ? Level 2   ? SPM 75   ? Minutes 30   ? METs 2   ?  ? Prescription Details  ? Frequency (times per week) 3   ? Duration Progress to 30 minutes of continuous aerobic without signs/symptoms of physical distress   ?  ? Intensity  ? THRR 40-80% of Max Heartrate 61-122   ? Ratings of Perceived Exertion 11-13   ? Perceived Dyspnea 0-4   ?  ? Progression  ? Progression Continue progressive overload as per policy without signs/symptoms or physical distress.   ?  ? Resistance Training  ? Training Prescription Yes   ? Weight 3 lbs   ? Reps 10-15   ? ?  ?  ? ?  ? ? ?Perform Capillary Blood Glucose checks as needed. ? ?Exercise Prescription Changes: ? ? Exercise Prescription Changes   ? ? [place] Name 07/18/21 1000 08/03/21 0945  ?  ?  ?  ?  ? Response to Exercise  ? Blood Pressure (Admit) 128/72 108/72     ?  Blood Pressure (Exercise) 134/72 122/70     ? Blood Pressure (Exit) 130/72  122/72     ? Heart Rate (Admit) 70 bpm 72 bpm     ? Heart Rate (Exercise) 88 bpm 82 bpm     ? Heart Rate (Exit) 63 bpm 66 bpm     ? Rating of Perceived Exertion (Exercise) 11 10     ? Symptoms None None     ? Comments Pt's first day in the CRP2 program Reviewed METs     ? Duration Continue with 30 min of aerobic exercise without signs/symptoms of physical distress. Continue with 30 min of aerobic exercise without signs/symptoms of physical distress.     ? Intensity THRR unchanged THRR unchanged     ?  ? Progression  ? Progression Continue to progress workloads to maintain intensity without signs/symptoms of physical distress. Continue to progress workloads to maintain intensity without signs/symptoms of physical distress.     ? Average METs 1.4 1.4     ?  ? Resistance Training  ? Training Prescription Yes No     ? Weight 3 lbs No weights on Wednesddays     ? Reps 10-15 --     ? Time 10 Minutes --     ?  ? Interval Training  ? Interval Training No No     ?  ? NuStep  ? Level 2 2     ? SPM 58 --     ? Minutes 30 30     ? METs 1.4 1.4     ? ?  ?  ? ?  ? ? ?Exercise Comments: ? ? Exercise Comments   ? ? [place] Name 07/18/21 1005 08/03/21 0945  ?  ?  ?  ? Exercise Comments Pt's first day in the CRP2 program. Pt had no complaints with his inital session. Modifications for warm-up, cool-down and weights were made due to pt's instability. Reviewed METs. Pt working at low level. Continue to encourage patient to increase SPM to impove MET level.     ? ?  ?  ? ?  ? ? ?Exercise Goals and Review: ? ? Exercise Goals   ? ? [place] Name 07/12/21 1110  ?  ?  ?  ?  ?  ? Exercise Goals  ? Increase Physical Activity Yes      ? Intervention Provide advice, education, support and counseling about physical activity/exercise needs.;Develop an individualized exercise prescription for aerobic and resistive training based on initial evaluation findings, risk stratification, comorbidities and participant's personal goals.      ? Expected Outcomes  Short Term: Attend rehab on a regular basis to increase amount of physical activity.;Long Term: Add in home exercise to make exercise part of routine and to increase amount of physical activity.;Long Term: Exerci

## 2021-08-10 ENCOUNTER — Encounter (HOSPITAL_COMMUNITY)
Admission: RE | Admit: 2021-08-10 | Discharge: 2021-08-10 | Disposition: A | Discharge status: Home / Self Care | Payer: No Typology Code available for payment source | Source: Ambulatory Visit | Attending: Interventional Cardiology | Admitting: Interventional Cardiology

## 2021-08-10 DIAGNOSIS — I252 Old myocardial infarction: Secondary | ICD-10-CM | POA: Diagnosis not present

## 2021-08-10 DIAGNOSIS — I214 Non-ST elevation (NSTEMI) myocardial infarction: Secondary | ICD-10-CM

## 2021-08-10 DIAGNOSIS — Z955 Presence of coronary angioplasty implant and graft: Secondary | ICD-10-CM

## 2021-08-12 ENCOUNTER — Encounter (HOSPITAL_COMMUNITY): Payer: No Typology Code available for payment source

## 2021-08-15 ENCOUNTER — Encounter (HOSPITAL_COMMUNITY)
Admission: RE | Admit: 2021-08-15 | Discharge: 2021-08-15 | Disposition: A | Discharge status: Home / Self Care | Payer: No Typology Code available for payment source | Source: Ambulatory Visit | Attending: Interventional Cardiology | Admitting: Interventional Cardiology

## 2021-08-15 DIAGNOSIS — I214 Non-ST elevation (NSTEMI) myocardial infarction: Secondary | ICD-10-CM

## 2021-08-15 DIAGNOSIS — Z955 Presence of coronary angioplasty implant and graft: Secondary | ICD-10-CM

## 2021-08-15 DIAGNOSIS — I252 Old myocardial infarction: Secondary | ICD-10-CM | POA: Diagnosis not present

## 2021-08-17 ENCOUNTER — Encounter (HOSPITAL_COMMUNITY)
Admission: RE | Admit: 2021-08-17 | Discharge: 2021-08-17 | Disposition: A | Discharge status: Home / Self Care | Payer: No Typology Code available for payment source | Source: Ambulatory Visit | Attending: Interventional Cardiology | Admitting: Interventional Cardiology

## 2021-08-17 DIAGNOSIS — I214 Non-ST elevation (NSTEMI) myocardial infarction: Secondary | ICD-10-CM

## 2021-08-17 DIAGNOSIS — I252 Old myocardial infarction: Secondary | ICD-10-CM | POA: Diagnosis not present

## 2021-08-17 DIAGNOSIS — Z955 Presence of coronary angioplasty implant and graft: Secondary | ICD-10-CM

## 2021-08-17 NOTE — Progress Notes (Signed)
Reviewed home exercise Rx with patient today.  Encouraged warm-up, cool-down, and stretching. Reviewed THRR of 61 - 122 and keeping RPE between 11-13. Encouraged to hydrate with activity.  ?Reviewed weather parameters for temperature and humidity for safe exercise outdoors. Reviewed S/S to terminate exercise and when to call 911 vs MD. Reviewed the use of NTG and pt was encouraged to carry at all times. Pt encouraged to always carry a cell phone for safety when exercising outdoors. Pt also encouraged to know what his blood sugar is before beginning exercise and to carry a rapid acting source of glucose. Pt verbalized understanding of the home exercise Rx and was provided a copy.  ? ?Lesly Rubenstein MS, ACSM-CEP, CCRP  ?

## 2021-08-19 ENCOUNTER — Encounter (HOSPITAL_COMMUNITY)
Admission: RE | Admit: 2021-08-19 | Discharge: 2021-08-19 | Disposition: A | Discharge status: Home / Self Care | Payer: No Typology Code available for payment source | Source: Ambulatory Visit | Attending: Interventional Cardiology | Admitting: Interventional Cardiology

## 2021-08-19 DIAGNOSIS — Z955 Presence of coronary angioplasty implant and graft: Secondary | ICD-10-CM

## 2021-08-19 DIAGNOSIS — I252 Old myocardial infarction: Secondary | ICD-10-CM | POA: Diagnosis not present

## 2021-08-19 DIAGNOSIS — I214 Non-ST elevation (NSTEMI) myocardial infarction: Secondary | ICD-10-CM

## 2021-08-22 ENCOUNTER — Encounter (HOSPITAL_COMMUNITY)
Admission: RE | Admit: 2021-08-22 | Discharge: 2021-08-22 | Disposition: A | Discharge status: Home / Self Care | Payer: No Typology Code available for payment source | Source: Ambulatory Visit | Attending: Interventional Cardiology | Admitting: Interventional Cardiology

## 2021-08-22 DIAGNOSIS — I214 Non-ST elevation (NSTEMI) myocardial infarction: Secondary | ICD-10-CM

## 2021-08-22 DIAGNOSIS — Z955 Presence of coronary angioplasty implant and graft: Secondary | ICD-10-CM

## 2021-08-22 DIAGNOSIS — I252 Old myocardial infarction: Secondary | ICD-10-CM | POA: Diagnosis not present

## 2021-08-22 NOTE — Progress Notes (Incomplete)
Cardiology Office Note:    Date:  08/22/2021   ID:  Jerry Moran, DOB [birth year], MRN [id number]  PCP:  [facility]  Cardiologist:  Sinclair Grooms, MD   Referring MD: [facility]   No chief complaint on file.   History of Present Illness:    Jerry Moran is a 66 y.o. male with a hx of NSTEMI 04/28/21 treated with DES RCA (with residual multivessel CAD with moderate LAD, LCx, and pOM1 with evidence of severe stenosis in distal OM1/bifurcation), type 2 diabetes mellitus, hypertension, prostate cancer, and recent CVA.   ***  Past Medical History:  Diagnosis Date   Coronary artery disease    Diabetes mellitus without complication ([place])    Hyperlipidemia    Hypertension    Prostate cancer ([place])    Stroke [facility])     Past Surgical History:  Procedure Laterality Date   CARDIAC CATHETERIZATION     CORONARY STENT INTERVENTION N/A 04/29/2021   Procedure: CORONARY STENT INTERVENTION;  Surgeon: Burnell Blanks, MD;  Location: [place] CV [facility];  Service: Cardiovascular;  Laterality: N/A;   INTRAVASCULAR PRESSURE WIRE/FFR STUDY N/A 04/29/2021   Procedure: INTRAVASCULAR PRESSURE WIRE/FFR STUDY;  Surgeon: Burnell Blanks, MD;  Location: [place] [facility];  Service: Cardiovascular;  Laterality: N/A;   LEFT HEART CATH AND CORONARY ANGIOGRAPHY N/A 04/29/2021   Procedure: LEFT HEART CATH AND CORONARY ANGIOGRAPHY;  Surgeon: Burnell Blanks, MD;  Location: [place] [facility];  Service: Cardiovascular;  Laterality: N/A;   PROSTATE BIOPSY      Current Medications: No outpatient medications have been marked as taking for the 08/23/21 encounter (Appointment) with Belva Crome, MD.     Allergies:   Patient has no known allergies.   Social History   Socioeconomic History   Marital status: Married    Spouse name: Almyra Free   Number of children: Not on file   Years of education: 13   Highest education level: GED or equivalent   Occupational History   Occupation: Retired  Tobacco Use   Smoking status: Former    Types: Cigarettes   Smokeless tobacco: Never   Tobacco comments:    Smoked cigarettes as a teenager  Scientific laboratory technician Use: Never used  Substance and Sexual Activity   Alcohol use: Not Currently   Drug use: Never   Sexual activity: Not Currently  Other Topics Concern   Not on file  Social History Narrative   Uses a cane   Social Determinants of Health   Financial Resource Strain: Not on file  Food Insecurity: Not on file  Transportation Needs: Not on file  Physical Activity: Not on file  Stress: Not on file  Social Connections: Not on file     Family History: The patient's family history includes Breast cancer in his mother. There is no history of Colon cancer, Pancreatic cancer, or Prostate cancer.  ROS:   Please see the history of present illness.    *** All other systems reviewed and are negative.  EKGs/Labs/Other Studies Reviewed:    The following studies were reviewed today: ***  EKG:  EKG ***  Recent Labs: 04/29/2021: B Natriuretic Peptide 102.1 04/30/2021: Hemoglobin 10.6; Platelets 203 05/26/2021: BUN 19; Creatinine, Ser 1.58; Potassium 3.8; Sodium 139  Recent Lipid Panel    Component Value Date/Time   CHOL 148 04/29/2021 0213   TRIG 83 04/29/2021 0213   HDL 40 (L) 04/29/2021 0213   CHOLHDL 3.7 04/29/2021 4098  VLDL 17 04/29/2021 0213   LDLCALC 91 04/29/2021 0213    Physical Exam:    VS:  There were no vitals taken for this visit.    Wt Readings from Last 3 Encounters:  07/12/21 180 lb 5.4 oz (81.8 kg)  05/11/21 178 lb 12.8 oz (81.1 kg)  04/28/21 175 lb (79.4 kg)     GEN: ***. No acute distress HEENT: Normal NECK: No JVD. LYMPHATICS: No lymphadenopathy CARDIAC: *** murmur. RRR *** gallop, or edema. VASCULAR: *** Normal Pulses. No bruits. RESPIRATORY:  Clear to auscultation without rales, wheezing or rhonchi  ABDOMEN: Soft, non-tender, non-distended,  No pulsatile mass, MUSCULOSKELETAL: No deformity  SKIN: Warm and dry NEUROLOGIC:  Alert and oriented x 3 PSYCHIATRIC:  Normal affect   ASSESSMENT:    1. Coronary artery disease involving native coronary artery of native heart without angina pectoris   2. Essential hypertension   3. Type 2 diabetes mellitus with hemoglobin A1c goal of less than 7.0% (HCC)   4. History of CVA (cerebrovascular accident)   5. Hyperlipidemia, unspecified hyperlipidemia type    PLAN:    In order of problems listed above:  ***   Medication Adjustments/Labs and Tests Ordered: Current medicines are reviewed at length with the patient today.  Concerns regarding medicines are outlined above.  No orders of the defined types were placed in this encounter.  No orders of the defined types were placed in this encounter.   There are no Patient Instructions on file for this visit.   Signed, Sinclair Grooms, MD  08/22/2021 12:15 PM    [place]

## 2021-08-23 ENCOUNTER — Ambulatory Visit: Payer: Medicare (Managed Care) | Admitting: Interventional Cardiology

## 2021-08-23 DIAGNOSIS — E785 Hyperlipidemia, unspecified: Secondary | ICD-10-CM

## 2021-08-23 DIAGNOSIS — I2510 Atherosclerotic heart disease of native coronary artery without angina pectoris: Secondary | ICD-10-CM

## 2021-08-23 DIAGNOSIS — E119 Type 2 diabetes mellitus without complications: Secondary | ICD-10-CM

## 2021-08-23 DIAGNOSIS — Z8673 Personal history of transient ischemic attack (TIA), and cerebral infarction without residual deficits: Secondary | ICD-10-CM

## 2021-08-23 DIAGNOSIS — I10 Essential (primary) hypertension: Secondary | ICD-10-CM

## 2021-08-24 ENCOUNTER — Encounter (HOSPITAL_COMMUNITY)
Admission: RE | Admit: 2021-08-24 | Discharge: 2021-08-24 | Disposition: A | Discharge status: Home / Self Care | Payer: No Typology Code available for payment source | Source: Ambulatory Visit | Attending: Interventional Cardiology | Admitting: Interventional Cardiology

## 2021-08-24 DIAGNOSIS — Z955 Presence of coronary angioplasty implant and graft: Secondary | ICD-10-CM

## 2021-08-24 DIAGNOSIS — I252 Old myocardial infarction: Secondary | ICD-10-CM | POA: Diagnosis not present

## 2021-08-24 DIAGNOSIS — I214 Non-ST elevation (NSTEMI) myocardial infarction: Secondary | ICD-10-CM

## 2021-08-26 ENCOUNTER — Encounter (HOSPITAL_COMMUNITY)
Admission: RE | Admit: 2021-08-26 | Discharge: 2021-08-26 | Disposition: A | Discharge status: Home / Self Care | Payer: No Typology Code available for payment source | Source: Ambulatory Visit | Attending: Interventional Cardiology | Admitting: Interventional Cardiology

## 2021-08-26 DIAGNOSIS — I252 Old myocardial infarction: Secondary | ICD-10-CM | POA: Diagnosis not present

## 2021-08-26 DIAGNOSIS — I214 Non-ST elevation (NSTEMI) myocardial infarction: Secondary | ICD-10-CM

## 2021-08-26 DIAGNOSIS — Z955 Presence of coronary angioplasty implant and graft: Secondary | ICD-10-CM

## 2021-08-27 ENCOUNTER — Emergency Department (HOSPITAL_COMMUNITY): Payer: No Typology Code available for payment source

## 2021-08-27 ENCOUNTER — Encounter (HOSPITAL_COMMUNITY): Payer: Self-pay | Admitting: Emergency Medicine

## 2021-08-27 ENCOUNTER — Emergency Department (HOSPITAL_COMMUNITY)
Admission: EM | Admit: 2021-08-27 | Discharge: 2021-08-27 | Disposition: A | Discharge status: Home / Self Care | Payer: No Typology Code available for payment source | Attending: Emergency Medicine | Admitting: Emergency Medicine

## 2021-08-27 ENCOUNTER — Other Ambulatory Visit: Payer: Self-pay

## 2021-08-27 DIAGNOSIS — Z7984 Long term (current) use of oral hypoglycemic drugs: Secondary | ICD-10-CM | POA: Diagnosis not present

## 2021-08-27 DIAGNOSIS — Z79899 Other long term (current) drug therapy: Secondary | ICD-10-CM | POA: Insufficient documentation

## 2021-08-27 DIAGNOSIS — E119 Type 2 diabetes mellitus without complications: Secondary | ICD-10-CM | POA: Insufficient documentation

## 2021-08-27 DIAGNOSIS — S92515A Nondisplaced fracture of proximal phalanx of left lesser toe(s), initial encounter for closed fracture: Secondary | ICD-10-CM

## 2021-08-27 DIAGNOSIS — S99922A Unspecified injury of left foot, initial encounter: Secondary | ICD-10-CM | POA: Diagnosis present

## 2021-08-27 DIAGNOSIS — W19XXXA Unspecified fall, initial encounter: Secondary | ICD-10-CM

## 2021-08-27 DIAGNOSIS — W108XXA Fall (on) (from) other stairs and steps, initial encounter: Secondary | ICD-10-CM | POA: Diagnosis not present

## 2021-08-27 DIAGNOSIS — I10 Essential (primary) hypertension: Secondary | ICD-10-CM | POA: Insufficient documentation

## 2021-08-27 DIAGNOSIS — S9032XA Contusion of left foot, initial encounter: Secondary | ICD-10-CM | POA: Diagnosis not present

## 2021-08-27 DIAGNOSIS — M79671 Pain in right foot: Secondary | ICD-10-CM | POA: Diagnosis not present

## 2021-08-27 DIAGNOSIS — M25572 Pain in left ankle and joints of left foot: Secondary | ICD-10-CM | POA: Diagnosis not present

## 2021-08-27 DIAGNOSIS — I2510 Atherosclerotic heart disease of native coronary artery without angina pectoris: Secondary | ICD-10-CM | POA: Insufficient documentation

## 2021-08-27 DIAGNOSIS — Y9301 Activity, walking, marching and hiking: Secondary | ICD-10-CM | POA: Insufficient documentation

## 2021-08-27 DIAGNOSIS — S9030XA Contusion of unspecified foot, initial encounter: Secondary | ICD-10-CM

## 2021-08-27 IMAGING — DX DG ANKLE COMPLETE 3+V*L*
3 series · 3 of 3 positions shown · non-contrast
Comparison: None Available.

CLINICAL DATA: Bilateral foot and left ankle pain.

EXAM:
LEFT ANKLE COMPLETE - 3+ VIEW

[ankle ap]
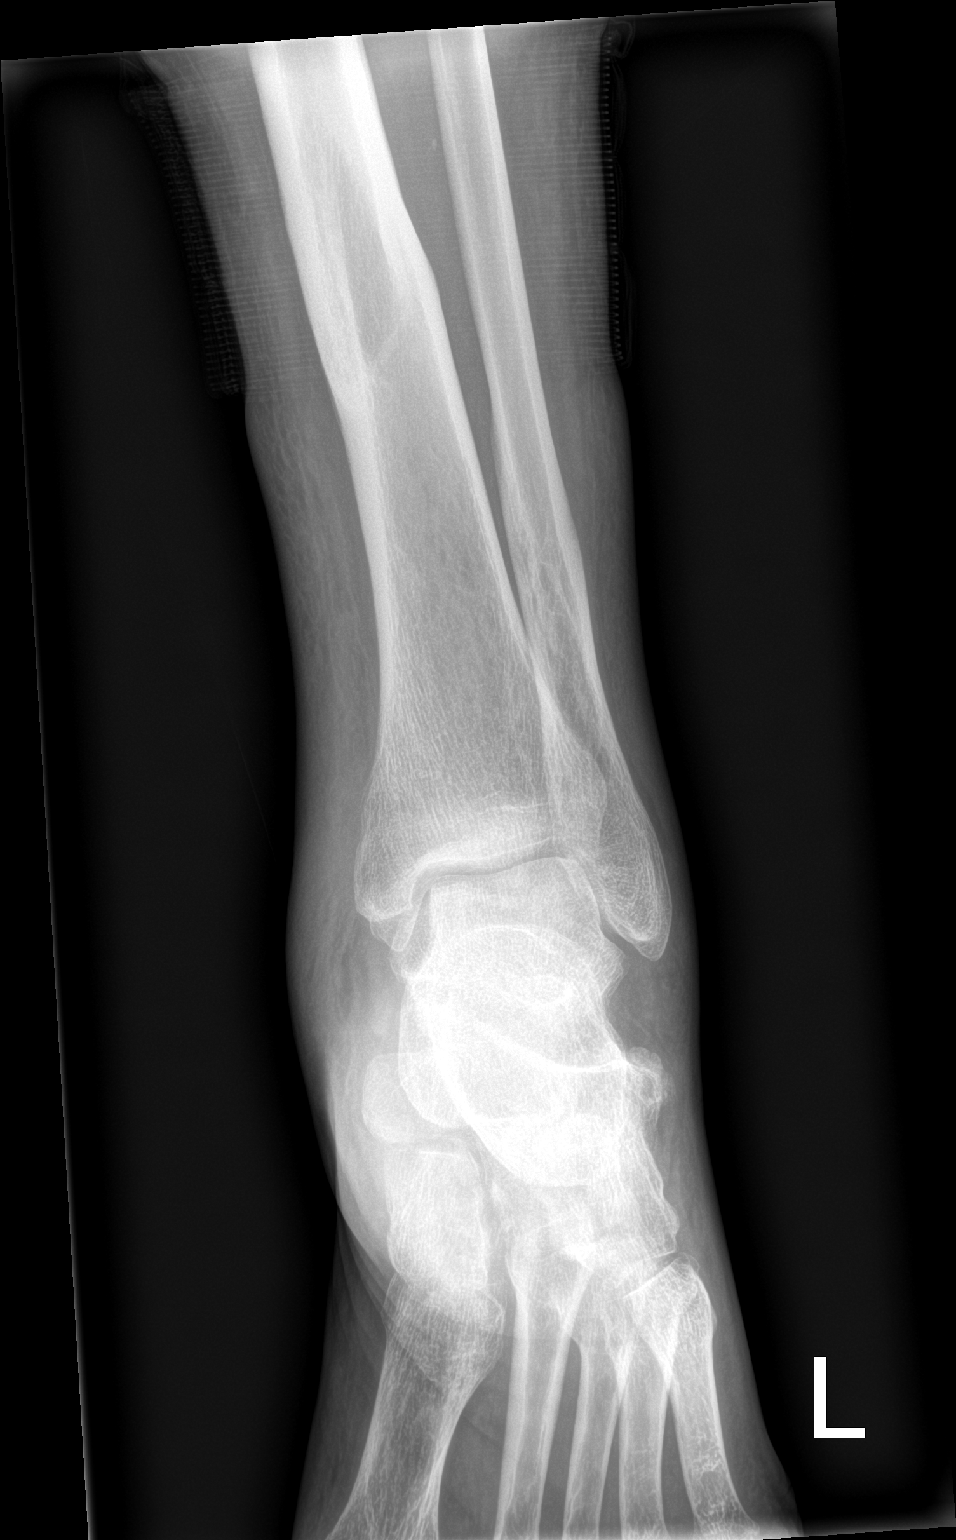

[ankle obl]
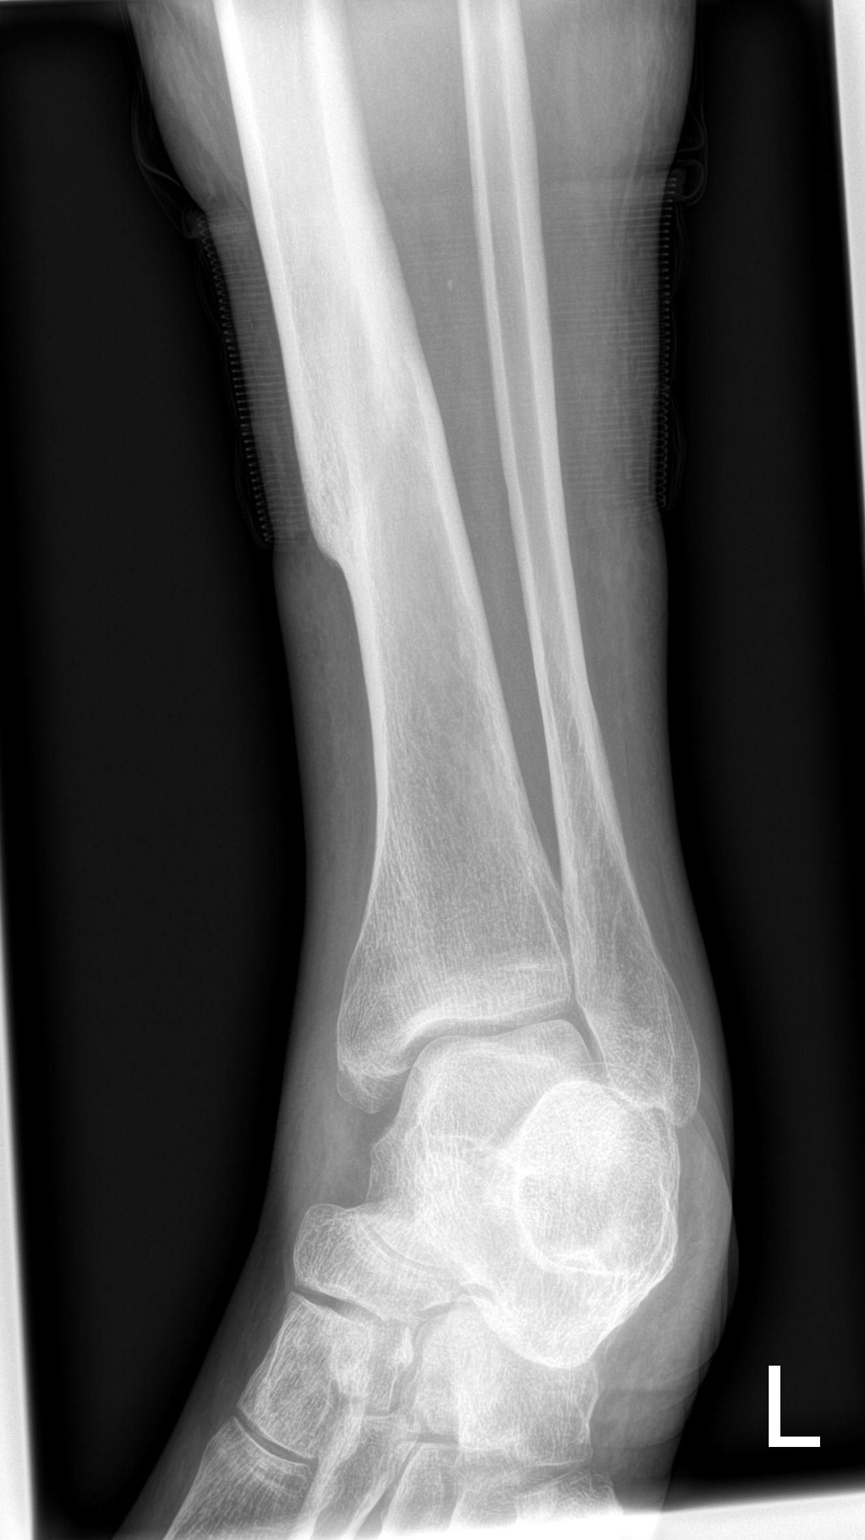

[ankle lat]
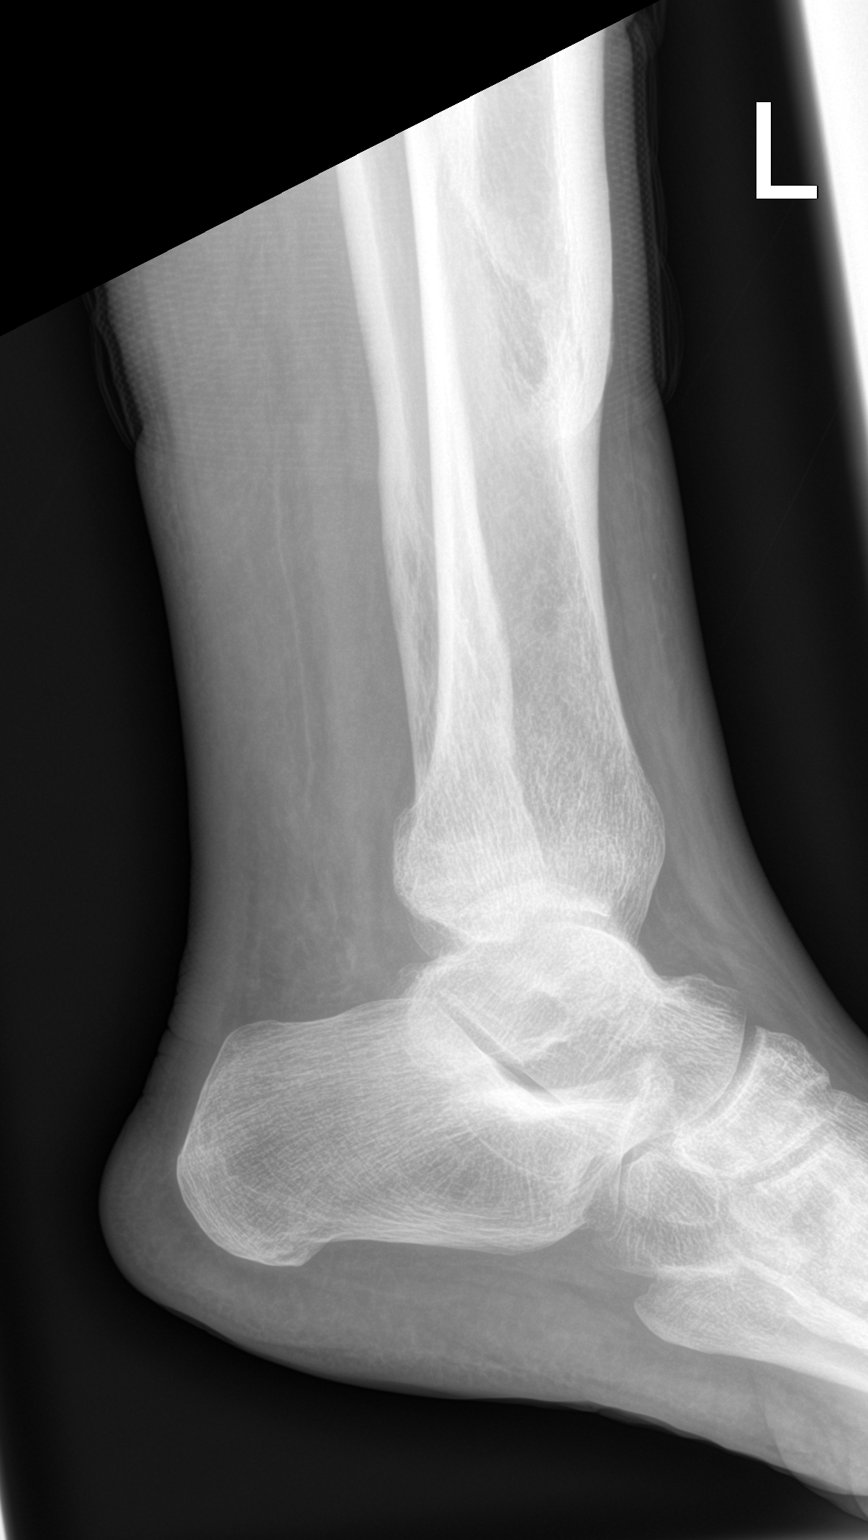

[3 of 3 positions shown; findings below may reference images not displayed]

FINDINGS: No acute fracture. Old, healed fractures of the distal tibia and
fibula shafts.

Ankle joint is normally spaced and aligned. No arthropathic changes.

There is soft tissue edema that predominates over the lateral aspect
of the ankle.
IMPRESSION: 1. No acute fracture.  Normally aligned ankle joint.

## 2021-08-27 IMAGING — DX DG FOOT COMPLETE 3+V*L*
3 series · 3 of 3 positions shown · non-contrast
Comparison: None Available.

CLINICAL DATA: Bilateral foot and left ankle pain.

EXAM:
LEFT FOOT - COMPLETE 3+ VIEW

[foot ap]
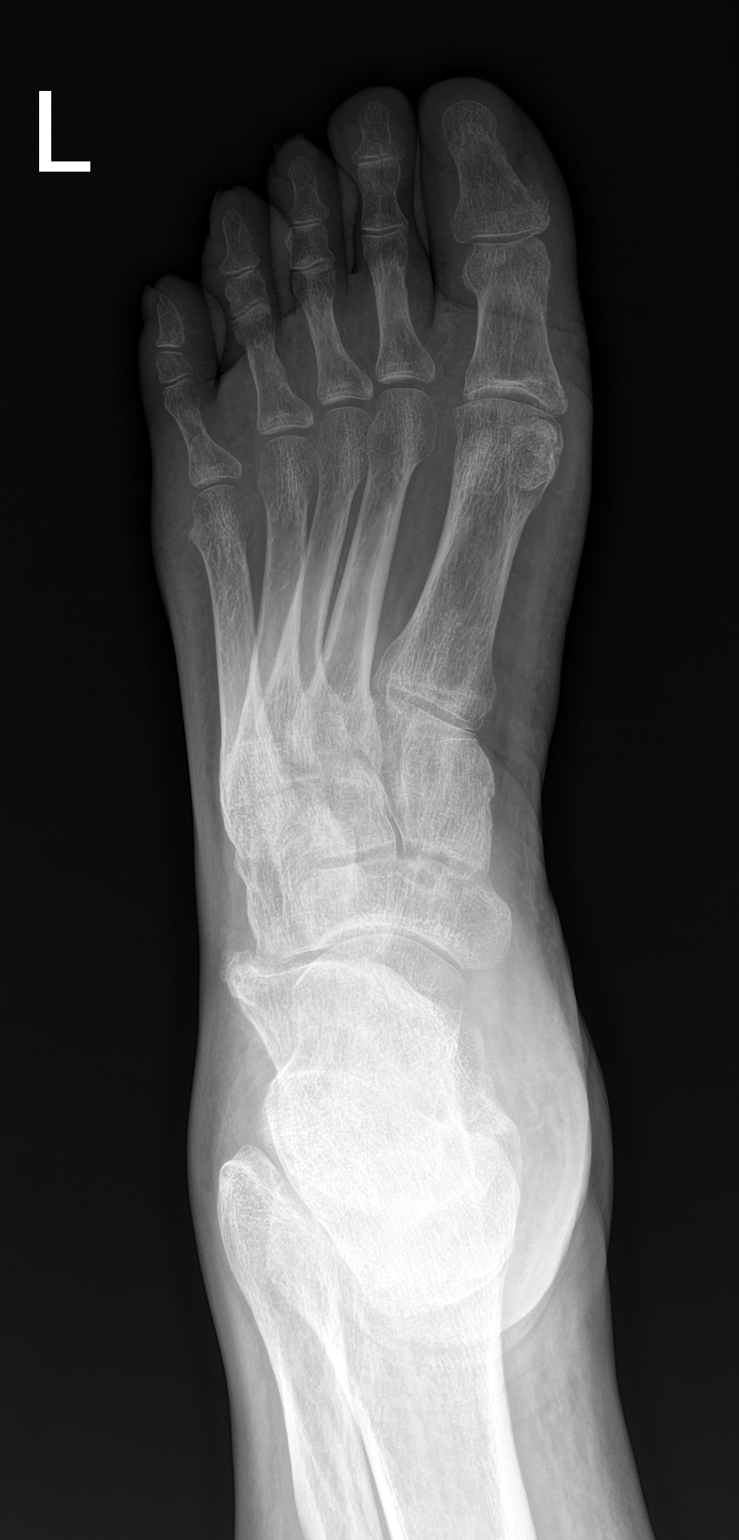

[foot obl]
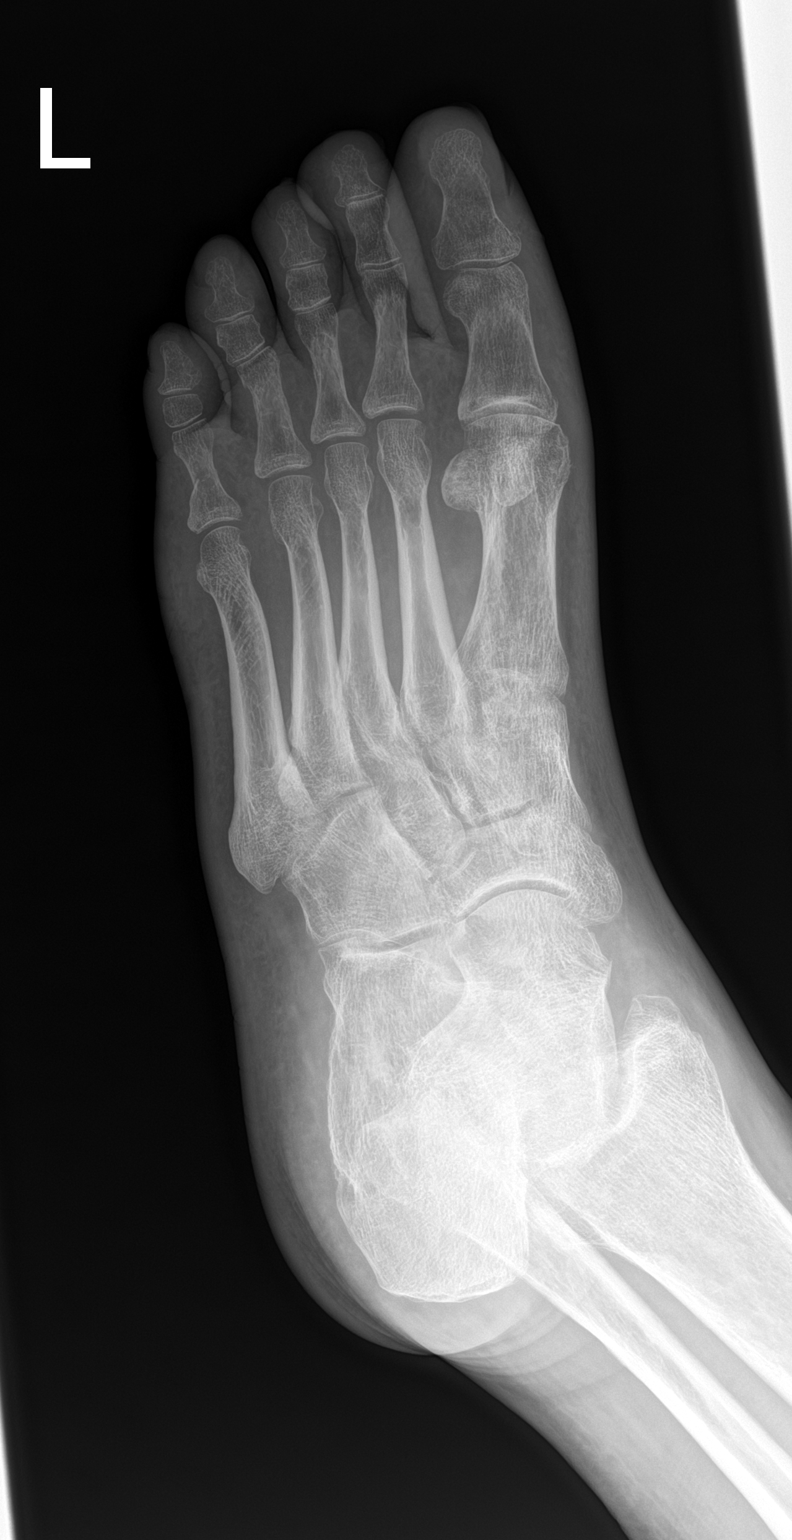

[foot lat]
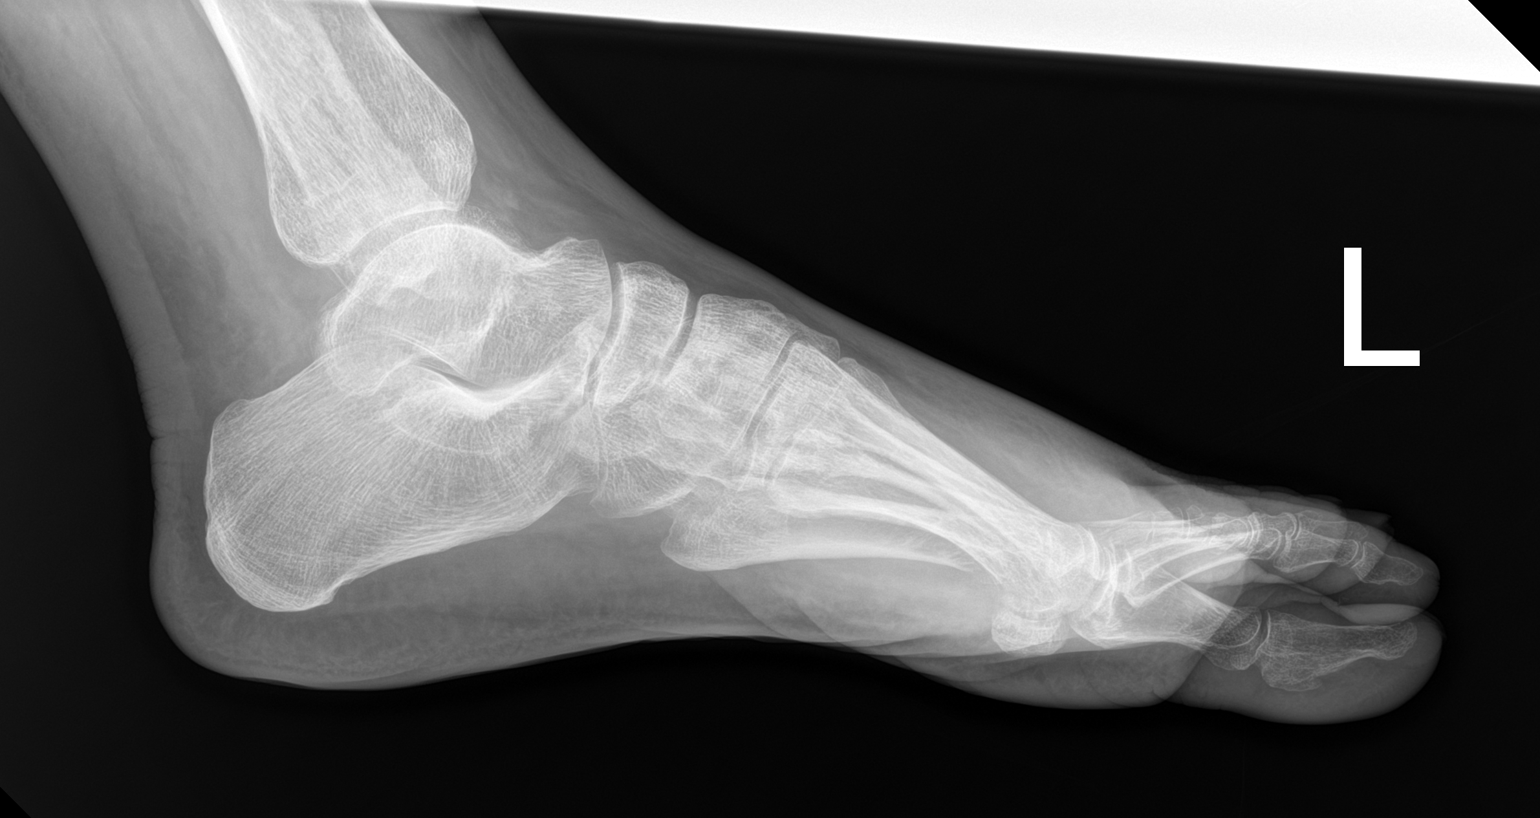

[3 of 3 positions shown; findings below may reference images not displayed]

FINDINGS: Subtle nondisplaced, non angulated fracture of the midshaft of the
left fifth toe proximal phalanx. Possible additional fracture of the
distal aspect of the fourth toe proximal phalanx

No other fractures.  No bone lesions.

Joints are normally spaced and aligned.

Mild forefoot soft tissue swelling.
IMPRESSION: 1. Subtle nondisplaced, non angulated fracture of the proximal
phalanx of the fifth toe with a possible additional fracture of the
distal proximal phalanx of the fourth toe. No other fractures. No
dislocation.

## 2021-08-27 NOTE — Discharge Instructions (Signed)
You have fractures of the fifth and fourth toe of the left foot.  I will provide you with a hard soled walking shoe and crutches use as needed.  Ice and elevation of your foot Tylenol 1000 mg every 6 hours for pain.  This should improve with time.  The fractures are well aligned.  I have given you the information for an orthopedist to follow-up with if you would like.  You may also see a podiatrist if you prefer.

## 2021-08-27 NOTE — ED Triage Notes (Signed)
Pt states he loss balance and fell down approx 6 steps this morning.  Denies hitting head and denies LOC.  Takes Brilinta.  C/o pain to L toes and top of R foot from putting his feet down trying to stop the fall.  Denies neck and back pain.

## 2021-08-27 NOTE — ED Provider Notes (Signed)
[facility] EMERGENCY DEPARTMENT Provider Note   CSN: [id number] Arrival date & time: 08/27/21  1537     History  Chief Complaint  Patient presents with   Jerry Moran is a 66 y.o. male.   Fall  Patient is a 66 year old male with a past medical history significant of CAD, HTN, HLD, CVA, DM 2  He presents emergency room today with complaints of bilateral foot pain he states that around 7 AM this morning while he was walking down the stairs his left foot slid out from underneath him causing him to fall backwards onto his buttocks and then slide down the stairs.  He states the only area that was injured during this fall was his left ankle left toes especially the fourth and fifth toes and the great toe and his right foot.   He specifically denies any back pain buttocks pain, other lower extremity pain, any upper extremity pain, headache nausea vomiting belly or chest pain.  No other associate symptoms.  Denies any bleeding    Home Medications Prior to Admission medications   Medication Sig Start Date End Date Taking? Authorizing Provider  amLODipine (NORVASC) 10 MG tablet Take 1 tablet (10 mg total) by mouth daily. 01/16/19   Kayleen Memos, DO  atorvastatin (LIPITOR) 80 MG tablet Take 1 tablet (80 mg total) by mouth at bedtime. 04/30/21   [name], Lorelee Cover., [name]  BAYER LOW DOSE 81 MG EC tablet Take 81 mg by mouth daily. Swallow whole.    [provider]  blood glucose meter kit and supplies KIT Dispense based on patient and insurance preference. Use up to four times daily as directed. (FOR ICD-9 250.00, 250.01). 01/16/19   Kayleen Memos, DO  carvedilol (COREG) 25 MG tablet Take 1 tablet (25 mg total) by mouth 2 (two) times daily with a meal. 04/30/21   [name], Lorelee Cover., [name]  chlorthalidone (HYGROTON) 25 MG tablet Take 25 mg by mouth daily. 05/04/21   [provider]  furosemide (LASIX) 20 MG tablet Take 1 tablet (20 mg total) by  mouth as needed for fluid or edema. Patient not taking: Reported on 07/08/2021 05/12/21   Imogene Burn, [name]  glipiZIDE (GLUCOTROL) 5 MG tablet Take 5 mg by mouth 2 (two) times daily before a meal.    [provider]  lisinopril (ZESTRIL) 40 MG tablet Take 40 mg by mouth every morning. 08/10/20   [provider]  metFORMIN (GLUCOPHAGE) 1000 MG tablet Take 1,000 mg by mouth 2 (two) times daily with a meal.    [provider]  nitroGLYCERIN (NITROSTAT) 0.4 MG SL tablet Place 1 tablet (0.4 mg total) under the tongue every 5 (five) minutes as needed for chest pain. 04/30/21   [name], Lorelee Cover., [name]  tamsulosin (FLOMAX) 0.4 MG CAPS capsule Take 0.4 mg by mouth daily.    [provider]  ticagrelor (BRILINTA) 90 MG TABS tablet Take 1 tablet (90 mg total) by mouth 2 (two) times daily. 04/30/21   [name], Lorelee Cover., [name]      Allergies    Patient has no known allergies.    Review of Systems   Review of Systems  Physical Exam Updated Vital Signs BP (!) 144/83   Pulse 62   Temp 98.5 F (36.9 C) (Oral)   Resp 14   SpO2 100%  Physical Exam Vitals and nursing note reviewed.  Constitutional:      General: He is not in  acute distress.    Appearance: He is obese.  HENT:     Head: Normocephalic and atraumatic.     Comments: No scalp tenderness.  No head trauma evident on my exam.    Nose: Nose normal.     Mouth/Throat:     Mouth: Mucous membranes are moist.  Eyes:     General: No scleral icterus. Cardiovascular:     Rate and Rhythm: Normal rate and regular rhythm.     Pulses: Normal pulses.     Heart sounds: Normal heart sounds.  Pulmonary:     Effort: Pulmonary effort is normal. No respiratory distress.     Breath sounds: No wheezing.  Abdominal:     Palpations: Abdomen is soft.     Tenderness: There is no abdominal tenderness.  Musculoskeletal:     Cervical back: Normal range of motion.     Right lower leg: No edema.     Left lower leg: No edema.      Comments: Tenderness to palpation over right fifth metatarsal, some tenderness of the toes no focal bony tenderness no step-off or deformity.  There is some tenderness palpation of left lateral malleolus left posterior malleolus and the great and second toe of left foot.  There is also tenderness of the fourth and fifth toes  Sensation intact in bilateral feet.  Able to wiggle toes bilaterally.   No OTHER bony tenderness over joints or long bones of the upper and lower extremities.    No neck or back midline tenderness, step-off, deformity, or bruising. Able to turn head left and right 45 degrees without difficulty.  Full range of motion of upper and lower extremity joints shown after palpation was conducted; with 5/5 symmetrical strength in upper and lower extremities. No chest wall tenderness, no facial or cranial tenderness.   Patient has intact sensation grossly in lower and upper extremities. Intact patellar and ankle reflexes.  DP AND PT pulses palpated BL.    Skin:    General: Skin is warm and dry.     Capillary Refill: Capillary refill takes less than 2 seconds.  Neurological:     Mental Status: He is alert. Mental status is at baseline.  Psychiatric:        Mood and Affect: Mood normal.        Behavior: Behavior normal.    ED Results / Procedures / Treatments   Labs (all labs ordered are listed, but only abnormal results are displayed) Labs Reviewed - No data to display  EKG None  Radiology No results found.  Procedures Procedures    Medications Ordered in ED Medications - No data to display  ED Course/ Medical Decision Making/ A&P Clinical Course as of 08/27/21 1724  Sat Aug 27, 3457  3137 66 year old male on [place] had a fall down 4 stairs today.  Complaining of bilateral foot pain.  Denies any head or neck injury.  No loss of consciousness.  He is otherwise well-appearing.  He has diffuse tenderness over his feet mostly distal on his toes.  Getting  x-rays.  Disposition per results of testing. [MB]  4163 Left foot with  IMPRESSION: 1. Subtle nondisplaced, non angulated fracture of the proximal phalanx of the fifth toe with a possible additional fracture of the distal proximal phalanx of the fourth toe. No other fractures. No dislocation.      [WF]  1720 I personally viewed images of right and left foot and left ankle.  I agree with radiology read  there is a subtle fracture of the proximal phalanx of fifth toe [WF]    Clinical Course User Index [MB] Hayden Rasmussen, MD [WF] Tedd Sias, [place]                           Medical Decision Making Amount and/or Complexity of Data Reviewed Radiology: ordered.   Patient is a 67 year old male with a past medical history significant of CAD, HTN, HLD, CVA, DM 2  He presents emergency room today with complaints of bilateral foot pain he states that around 7 AM this morning while he was walking down the stairs his left foot slid out from underneath him causing him to fall backwards onto his buttocks and then slide down the stairs.  He states the only area that was injured during this fall was his left ankle left toes especially the fourth and fifth toes and the great toe and his right foot.   He specifically denies any back pain buttocks pain, other lower extremity pain, any upper extremity pain, headache nausea vomiting belly or chest pain.  No other associate symptoms.  Denies any bleeding   Physical exam notable for some lower extremity tenderness over the left ankle left toes and right toes/right fifth metatarsal.  Will obtain x-rays.  I personally viewed these x-rays agree with radiology read there are fractures of the left fourth and fifth toe.     I discussed this case with my attending physician who cosigned this note including patient's presenting symptoms, physical exam, and planned diagnostics and interventions. Attending physician stated agreement with plan or made changes  to plan which were implemented.   Attending physician assessed patient at bedside.   We will place in postop shoe provided with crutches and recommend follow-up with orthopedics or podiatry.  Return precautions were discussed.  He will need to follow-up with his PCP eventually as well.  All questions answered to the best of my ability.  Final Clinical Impression(s) / ED Diagnoses Final diagnoses:  Fall, initial encounter  Bilateral foot pain  Contusion of foot, unspecified laterality, initial encounter  Acute left ankle pain    Rx / DC Orders ED Discharge Orders     None         Tedd Sias, [place] 08/27/21 2006    Hayden Rasmussen, MD 08/28/21 1032

## 2021-08-31 ENCOUNTER — Encounter (HOSPITAL_COMMUNITY)
Admission: RE | Admit: 2021-08-31 | Discharge: 2021-08-31 | Disposition: A | Discharge status: Home / Self Care | Payer: No Typology Code available for payment source | Source: Ambulatory Visit | Attending: Interventional Cardiology | Admitting: Interventional Cardiology

## 2021-08-31 DIAGNOSIS — I214 Non-ST elevation (NSTEMI) myocardial infarction: Secondary | ICD-10-CM

## 2021-08-31 DIAGNOSIS — Z955 Presence of coronary angioplasty implant and graft: Secondary | ICD-10-CM

## 2021-08-31 NOTE — Progress Notes (Signed)
Incomplete Session Note  Patient Details  Name: Jerry Moran MRN: [id number] Date of Birth: 1955/07/26 Referring Provider:   Flowsheet Row CARDIAC REHAB PHASE II ORIENTATION from 07/12/2021 in [place]  Referring Provider Daneen Schick, MD       [name] did not complete his rehab session.  [name] noted while walking into the hospital to ambulate very gingerly.  Inquired and pt reported that he fell down the steps at home and went to the ER.  Reviewed ER notes, pt with xrays which showed he had a fracture to his "pink" toe.  Pt given a special shoe to wear however he is wearing athletic tennis shoe. Pt reports no other complaints other than generalized soreness.  Per pt this fall was at a result of missing a step, denies any symptoms prior to the fall.  Pt had not followed up with his PCP at the [place] - fall occurred on 5/27.  Explained thoroughly with pt that he would need clearance in order to return to exercise and detail any restrictions of activity.  Pt verbalized that he understood however he is unwilling to go to the [place] - for as he states, "trivial" matter much to do about nothing". [name] felt it was not necessary to go see his doctor at the [place].  Explained the reason why and our departmental procedure. Again verbalized understanding but remained unwilling to go.  Pt stated he was going to [place] on Friday and will call us when he returns. Asked him to please discuss with his wife with the understanding that if he does not complete follow up he will be discharged with sessions completed.  Departmental contact information given to pt. [name], BSN Cardiac and Training and development officer

## 2021-09-02 ENCOUNTER — Encounter (HOSPITAL_COMMUNITY): Payer: No Typology Code available for payment source

## 2021-09-05 ENCOUNTER — Encounter (HOSPITAL_COMMUNITY): Payer: No Typology Code available for payment source

## 2021-09-06 NOTE — Progress Notes (Signed)
Cardiac Individual Treatment Plan  Patient Details  Name: Jerry Moran MRN: [id number] Date of Birth: Oct 02, 1955 Referring Provider:   Flowsheet Row CARDIAC REHAB PHASE II ORIENTATION from 07/12/2021 in [facility]  Referring Provider Daneen Schick, MD       Initial Encounter Date:  [place] PHASE II ORIENTATION from 07/12/2021 in [facility]  Date 07/12/21       Visit Diagnosis: 04/28/21 NSTEMI  04/29/21 S/P DES RCA  Patient's Home Medications on Admission:  Current Outpatient Medications:    amLODipine (NORVASC) 10 MG tablet, Take 1 tablet (10 mg total) by mouth daily., Disp: 30 tablet, Rfl: 0   atorvastatin (LIPITOR) 80 MG tablet, Take 1 tablet (80 mg total) by mouth at bedtime., Disp: 90 tablet, Rfl: 3   BAYER LOW DOSE 81 MG EC tablet, Take 81 mg by mouth daily. Swallow whole., Disp: , Rfl:    blood glucose meter kit and supplies KIT, Dispense based on patient and insurance preference. Use up to four times daily as directed. (FOR ICD-9 250.00, 250.01)., Disp: 1 each, Rfl: 0   carvedilol (COREG) 25 MG tablet, Take 1 tablet (25 mg total) by mouth 2 (two) times daily with a meal., Disp: 60 tablet, Rfl: 6   chlorthalidone (HYGROTON) 25 MG tablet, Take 25 mg by mouth daily., Disp: , Rfl:    furosemide (LASIX) 20 MG tablet, Take 1 tablet (20 mg total) by mouth as needed for fluid or edema. (Patient not taking: Reported on 07/08/2021), Disp: 30 tablet, Rfl: 3   glipiZIDE (GLUCOTROL) 5 MG tablet, Take 5 mg by mouth 2 (two) times daily before a meal., Disp: , Rfl:    lisinopril (ZESTRIL) 40 MG tablet, Take 40 mg by mouth every morning., Disp: , Rfl:    metFORMIN (GLUCOPHAGE) 1000 MG tablet, Take 1,000 mg by mouth 2 (two) times daily with a meal., Disp: , Rfl:    nitroGLYCERIN (NITROSTAT) 0.4 MG SL tablet, Place 1 tablet (0.4 mg total) under the tongue every 5 (five) minutes as needed for chest pain., Disp:  25 tablet, Rfl: 3   tamsulosin (FLOMAX) 0.4 MG CAPS capsule, Take 0.4 mg by mouth daily., Disp: , Rfl:    ticagrelor (BRILINTA) 90 MG TABS tablet, Take 1 tablet (90 mg total) by mouth 2 (two) times daily., Disp: 60 tablet, Rfl: 6  Past Medical History: Past Medical History:  Diagnosis Date   Coronary artery disease    Diabetes mellitus without complication ([place])    Hyperlipidemia    Hypertension    Prostate cancer ([place])    Stroke ([place])     Tobacco Use: Social History   Tobacco Use  Smoking Status Former   Types: Cigarettes  Smokeless Tobacco Never  Tobacco Comments   Smoked cigarettes as a teenager    Labs: Review Flowsheet        Latest Ref Rng & Units 01/14/2019 01/22/2019 04/29/2021  Labs for ITP Cardiac and Pulmonary Rehab  Cholestrol 0 - 200 mg/dL 252    148    LDL (calc) 0 - 99 mg/dL 165    91    HDL-C >40 mg/dL 35    40    Trlycerides <150 mg/dL 258    83    Hemoglobin A1c 4.8 - 5.6 % 11.5    6.0    TCO2 22 - 32 mmol/L  24             Capillary Blood  Glucose: Lab Results  Component Value Date   GLUCAP 128 (H) 07/29/2021   GLUCAP 112 (H) 07/27/2021   GLUCAP 141 (H) 07/25/2021   GLUCAP 117 (H) 07/22/2021   GLUCAP 97 07/22/2021     Exercise Target Goals: Exercise Program Goal: Individual exercise prescription set using results from initial 6 min walk test and THRR while considering  patient's activity barriers and safety.   Exercise Prescription Goal: Starting with aerobic activity 30 plus minutes a day, 3 days per week for initial exercise prescription. Provide home exercise prescription and guidelines that participant acknowledges understanding prior to discharge.  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 HR 84 bpm     Max Ex. BP 148/82     2 Minute Post BP 120/74              Oxygen Initial Assessment:   Oxygen Re-Evaluation:   Oxygen Discharge (Final Oxygen Re-Evaluation):   Initial Exercise Prescription:  Initial Exercise Prescription - 07/12/21 1200       Date of Initial Exercise RX and Referring Provider   Date 07/12/21    Referring Provider Daneen Schick, MD    Expected Discharge Date 09/09/21      NuStep   Level 2    SPM 75    Minutes 30    METs 2      Prescription Details   Frequency (times per week) 3    Duration Progress to 30 minutes of continuous aerobic without signs/symptoms of physical distress      Intensity   THRR 40-80% of Max Heartrate 61-122    Ratings of Perceived Exertion 11-13    Perceived Dyspnea 0-4      Progression   Progression Continue progressive overload as per policy without signs/symptoms or physical distress.      Resistance Training   Training Prescription Yes    Weight 3 lbs    Reps 10-15             Perform Capillary Blood Glucose checks as needed.  Exercise Prescription Changes:   Exercise Prescription Changes     Row Name 07/18/21 1000 08/03/21 0945 08/17/21 1100         Response to Exercise   Blood Pressure (Admit) 128/72 108/72 142/72     Blood Pressure (Exercise) 134/72 122/70 156/72  Blood Pressure (Exit) 130/72 122/72 142/72     Heart Rate (Admit) 70 bpm 72 bpm 77 bpm      Heart Rate (Exercise) 88 bpm 82 bpm 91 bpm     Heart Rate (Exit) 63 bpm 66 bpm 66 bpm     Rating of Perceived Exertion (Exercise) 11 10 12      Symptoms None None None     Comments Pt's first day in the CRP2 program Reviewed METs Reviewed goals/METs/Home exercise Rx     Duration Continue with 30 min of aerobic exercise without signs/symptoms of physical distress. Continue with 30 min of aerobic exercise without signs/symptoms of physical distress. Continue with 30 min of aerobic exercise without signs/symptoms of physical distress.     Intensity THRR unchanged THRR unchanged THRR unchanged       Progression   Progression Continue to progress workloads to maintain intensity without signs/symptoms of physical distress. Continue to progress workloads to maintain intensity without signs/symptoms of physical distress. Continue to progress workloads to maintain intensity without signs/symptoms of physical distress.     Average METs 1.4 1.4 1.6       Resistance Training   Training Prescription Yes No No     Weight 3 lbs No weights on Wednesddays No weights on Wednesddays     Reps 10-15 -- --     Time 10 Minutes -- --       Interval Training   Interval Training No No No       NuStep   Level 2 2 2      SPM 58 -- --     Minutes 30 30 34     METs 1.4 1.4 1.6       Home Exercise Plan   Plans to continue exercise at -- -- Home (comment)     Frequency -- -- Add 4 additional days to program exercise sessions.     Initial Home Exercises Provided -- -- 08/17/21              Exercise Comments:   Exercise Comments     Row Name 07/18/21 1005 08/03/21 0945 08/17/21 1149       Exercise Comments Pt's first day in the CRP2 program. Pt had no complaints with his inital session. Modifications for warm-up, cool-down and weights were made due to pt's instability. Reviewed METs. Pt working at low level. Continue to encourage patient to increase SPM to impove MET level. Reviewed METs, goals and home  exercise Rx. Pt verbalized understnading of the home exercise Rx and was provided a copy.              Exercise Goals and Review:   Exercise Goals     Row Name 07/12/21 1110             Exercise Goals   Increase Physical Activity Yes       Intervention Provide advice, education, support and counseling about physical activity/exercise needs.;Develop an individualized exercise prescription for aerobic and resistive training based on initial evaluation findings, risk stratification, comorbidities and participant's personal goals.       Expected Outcomes Short Term: Attend rehab on a regular basis to increase amount of physical activity.;Long Term: Add in home exercise to make exercise part of routine and to increase amount of physical activity.;Long Term: Exercising regularly at least 3-5 days a week.       Increase Strength and Stamina Yes       Intervention Provide advice, education, support and  counseling about physical activity/exercise needs.;Develop an individualized exercise prescription for aerobic and resistive training based on initial evaluation findings, risk stratification, comorbidities and participant's personal goals.       Expected Outcomes Short Term: Increase workloads from initial exercise prescription for resistance, speed, and METs.;Short Term: Perform resistance training exercises routinely during rehab and add in resistance training at home;Long Term: Improve cardiorespiratory fitness, muscular endurance and strength as measured by increased METs and functional capacity (6MWT)       Able to understand and use rate of perceived exertion (RPE) scale Yes       Intervention Provide education and explanation on how to use RPE scale       Expected Outcomes Short Term: Able to use RPE daily in rehab to express subjective intensity level;Long Term:  Able to use RPE to guide intensity level when exercising independently       Intervention Provide education and explanation of  THRR including how the numbers were predicted and where they are located for reference       Expected Outcomes Short Term: Able to use daily as guideline for intensity in rehab;Long Term: Able to use THRR to govern intensity when exercising independently       Understanding of Exercise Prescription Yes       Intervention Provide education, explanation, and written materials on patient's individual exercise prescription       Expected Outcomes Short Term: Able to explain program exercise prescription;Long Term: Able to explain home exercise prescription to exercise independently                Exercise Goals Re-Evaluation :  Exercise Goals Re-Evaluation     [place] Name 07/18/21 1004 08/17/21 1124           Exercise Goal Re-Evaluation   Exercise Goals Review Increase Physical Activity;Increase Strength and Stamina;Able to understand and use rate of perceived exertion (RPE) scale;Knowledge and understanding of Target Heart Rate Range (THRR);Understanding of Exercise Prescription Increase Physical Activity;Increase Strength and Stamina;Able to understand and use rate of perceived exertion (RPE) scale;Knowledge and understanding of Target Heart Rate Range (THRR);Understanding of Exercise Prescription      Comments Pt's first day in the CRP2 program. Pt understnads the exercise Rx, THRR, and RPE scale. Reviewed METs, goals and home exercise Rx. Pt voices that he is walking at home and is now able to walk the full block. Pt vocices his stamina for walking has improved which is a patient goal.      Expected Outcomes Will continue to monitor patiant and progress exercise workloads as tolerated. Pt will continue to walk at home 5x/week with the intent to reach a total of 30 minutes, divided into 2-3 bouts of 10-15 minutes.                Discharge Exercise Prescription (Final Exercise Prescription Changes):  Exercise Prescription Changes - 08/17/21 1100       Response to Exercise   Blood  Pressure (Admit) 142/72    Blood Pressure (Exercise) 156/72    Blood Pressure (Exit) 142/72    Heart Rate (Admit) 77 bpm    Heart Rate (Exercise) 91 bpm    Heart Rate (Exit) 66 bpm    Rating of Perceived Exertion (Exercise) 12    Symptoms None    Comments Reviewed goals/METs/Home exercise Rx    Duration Continue with 30 min of aerobic exercise without signs/symptoms of physical distress.    Intensity THRR unchanged  Progression   Progression Continue to progress workloads to maintain intensity without signs/symptoms of physical distress.    Average METs 1.6      Resistance Training   Training Prescription No    Weight No weights on Wednesddays      Interval Training   Interval Training No      NuStep   Level 2    Minutes 34    METs 1.6      Home Exercise Plan   Plans to continue exercise at Home (comment)    Frequency Add 4 additional days to program exercise sessions.    Initial Home Exercises Provided 08/17/21             Nutrition:  Target Goals: Understanding of nutrition guidelines, daily intake of sodium <1589m, cholesterol <2034m calories 30% from fat and 7% or less from saturated fats, daily to have 5 or more servings of fruits and vegetables.  Biometrics:  Pre Biometrics - 07/12/21 1013       Pre Biometrics   Waist Circumference 40 inches    Hip Circumference 41 inches    Waist to Hip Ratio 0.98 %    Triceps Skinfold 12 mm    % Body Fat 27 %    Grip Strength 27 kg    Flexibility 9 in    Single Leg Stand --   Not perfomed: S/P CVA, uses cane             Nutrition Therapy Plan and Nutrition Goals:  Nutrition Therapy & Goals - 08/17/21 0919       Nutrition Therapy   Diet Heart healthy diet    Drug/Food Interactions Statins/Certain Fruits      Personal Nutrition Goals   Nutrition Goal Patient to identify food sources of saturated fat. Decrease bacon/sausage intake to <3x/week.    Personal Goal #2 Patient to describe the benefit of  including fruits, vegetables, whole grains, lean protein/plant protein, and low fat dairy as part of heart healthy meal plan    Personal Goal #3 Pair protein + carbohydrate together at meals and snacks to support blood sugar control.   in progress.   Comments Reviewed blood sugar control strategies. Encouraged inceasing fiber intake through fruits and vegetables to support heart health and blood sugar; reviewed the plate method.      Intervention Plan   Intervention Prescribe, educate and counsel regarding individualized specific dietary modifications aiming towards targeted core components such as weight, hypertension, lipid management, diabetes, heart failure and other comorbidities.    Expected Outcomes Short Term Goal: Understand basic principles of dietary content, such as calories, fat, sodium, cholesterol and nutrients.;Long Term Goal: Adherence to prescribed nutrition plan.             Nutrition Assessments:  Nutrition Assessments - 07/20/21 0927       Rate Your Plate Scores   Pre Score 44            MEDIFICTS Score Key: ?70 Need to make dietary changes  40-70 Heart Healthy Diet ? 40 Therapeutic Level Cholesterol Diet  Flowsheet Row CARDIAC REHAB [facility] [place] Total Score on [place] 44      Picture Your Plate Scores: <4<71nhealthy dietary pattern with much room for improvement. 41-50 Dietary pattern unlikely to meet recommendations for good health and room for improvement. 51-60 More healthful dietary pattern, with some room for improvement.  >60 Healthy dietary pattern,  although there may be some specific behaviors that could be improved.    Nutrition Goals Re-Evaluation:   Nutrition Goals Discharge (Final Nutrition Goals Re-Evaluation):   Psychosocial: Target Goals: Acknowledge presence or absence of significant depression and/or stress, maximize coping skills, provide  positive support system. Participant is able to verbalize types and ability to use techniques and skills needed for reducing stress and depression.  Initial Review & Psychosocial Screening:  Initial Psych Review & Screening - 07/12/21 0909       Initial Review   Current issues with Current Stress Concerns    Source of Stress Concerns Chronic Illness;Unable to participate in former interests or hobbies;Unable to perform yard/household activities    Comments Wen reports that he is not currently depressed. Evie's abilities are limited due to his CVA      Family Dynamics   Good Support System? Yes   Johnwesley has his wife and adult children for support. Gwyndolyn Saxon and his wife has an adpotive daughter they care for who has a trach     Barriers   Psychosocial barriers to participate in program The patient should benefit from training in stress management and relaxation.      Screening Interventions   Interventions Encouraged to exercise    Expected Outcomes Long Term Goal: Stressors or current issues are controlled or eliminated.             Quality of Life Scores:  Quality of Life - 07/12/21 1014       Quality of Life   Select Quality of Life      Quality of Life Scores   Health/Function Pre 16.57 %    Socioeconomic Pre 23.64 %    Psych/Spiritual Pre 20.71 %    Family Pre 28.3 %    GLOBAL Pre 20.73 %            Scores of 19 and below usually indicate a poorer quality of life in these areas.  A difference of  2-3 points is a clinically meaningful difference.  A difference of 2-3 points in the total score of the Quality of Life Index has been associated with significant improvement in overall quality of life, self-image, physical symptoms, and general health in studies assessing change in quality of life.  PHQ-9: Review Flowsheet        07/12/2021  Depression screen PHQ 2/9  Decreased Interest 0  Down, Depressed, Hopeless 0  PHQ - 2 Score 0          Interpretation of Total Score  Total Score Depression Severity:  1-4 = Minimal depression, 5-9 = Mild depression, 10-14 = Moderate depression, 15-19 = Moderately severe depression, 20-27 = Severe depression   Psychosocial Evaluation and Intervention:   Psychosocial Re-Evaluation:  Psychosocial Re-Evaluation     [place] Name 07/18/21 1210 08/09/21 1616 09/06/21 1409         Psychosocial Re-Evaluation   Current issues with Current Stress Concerns Current Stress Concerns Current Stress Concerns     Comments Reviewed quality of life. Ilario says his stress concerns are moslty due to his CVA. Ory denies being depessed curently Keelen denies being depessed currently Unable to reasses as exercise is currently on hold     Expected Outcomes Peregrine will have controlled or decreased stress upon completion of phase 2 cardiac rehab. Othell will have controlled or decreased stress upon completion of phase 2 cardiac rehab. Hektor will have controlled or decreased stress upon completion of phase 2 cardiac rehab upon return  to exercise.     Interventions Stress management education;Relaxation education;Encouraged to attend Cardiac Rehabilitation for the exercise Stress management education;Relaxation education;Encouraged to attend Cardiac Rehabilitation for the exercise Stress management education;Relaxation education;Encouraged to attend Cardiac Rehabilitation for the exercise     Continue Psychosocial Services  Follow up required by staff Follow up required by staff Follow up required by staff       Initial Review   Source of Stress Concerns Chronic Illness Chronic Illness Chronic Illness     Comments Will continue to monitor and offer support as needed. Will continue to monitor and offer support as needed. Will continue to monitor and offer support as needed.              Psychosocial Discharge (Final Psychosocial Re-Evaluation):  Psychosocial Re-Evaluation - 09/06/21 1409        Psychosocial Re-Evaluation   Current issues with Current Stress Concerns    Comments Unable to reasses as exercise is currently on hold    Expected Outcomes Siddh will have controlled or decreased stress upon completion of phase 2 cardiac rehab upon return to exercise.    Interventions Stress management education;Relaxation education;Encouraged to attend Cardiac Rehabilitation for the exercise    Continue Psychosocial Services  Follow up required by staff      Initial Review   Source of Stress Concerns Chronic Illness    Comments Will continue to monitor and offer support as needed.             Vocational Rehabilitation: Provide vocational rehab assistance to qualifying candidates.   Vocational Rehab Evaluation & Intervention:  Vocational Rehab - 07/12/21 0915       Initial Vocational Rehab Evaluation & Intervention   Assessment shows need for Vocational Rehabilitation No   Natthew is retired and does not need vocational rehab at this time            Education: Education Goals: Education classes will be provided on a weekly basis, covering required topics. Participant will state understanding/return demonstration of topics presented.  Learning Barriers/Preferences:  Learning Barriers/Preferences - 07/12/21 1213       Learning Barriers/Preferences   Learning Barriers Sight   wears glasses   Learning Preferences Written Material;Pictoral;Video;Computer/Internet             Education Topics: Hypertension, Hypertension Reduction -Define heart disease and high blood pressure. Discus how high blood pressure affects the body and ways to reduce high blood pressure.   Exercise and Your Heart -Discuss why it is important to exercise, the FITT principles of exercise, normal and abnormal responses to exercise, and how to exercise safely.   Angina -Discuss definition of angina, causes of angina, treatment of angina, and how to decrease risk of having  angina.   Cardiac Medications -Review what the following cardiac medications are used for, how they affect the body, and side effects that may occur when taking the medications.  Medications include Aspirin, Beta blockers, calcium channel blockers, ACE Inhibitors, angiotensin receptor blockers, diuretics, digoxin, and antihyperlipidemics.   Congestive Heart Failure -Discuss the definition of CHF, how to live with CHF, the signs and symptoms of CHF, and how keep track of weight and sodium intake.   Heart Disease and Intimacy -Discus the effect sexual activity has on the heart, how changes occur during intimacy as we age, and safety during sexual activity.   Smoking Cessation / COPD -Discuss different methods to quit smoking, the health benefits of quitting smoking, and the definition of COPD.  Nutrition I: Fats -Discuss the types of cholesterol, what cholesterol does to the heart, and how cholesterol levels can be controlled.   Nutrition II: Labels -Discuss the different components of food labels and how to read food label   Heart Parts/Heart Disease and PAD -Discuss the anatomy of the heart, the pathway of blood circulation through the heart, and these are affected by heart disease.   Stress I: Signs and Symptoms -Discuss the causes of stress, how stress may lead to anxiety and depression, and ways to limit stress.   Stress II: Relaxation -Discuss different types of relaxation techniques to limit stress.   Warning Signs of Stroke / TIA -Discuss definition of a stroke, what the signs and symptoms are of a stroke, and how to identify when someone is having stroke.   Knowledge Questionnaire Score:  Knowledge Questionnaire Score - 07/12/21 1014       Knowledge Questionnaire Score   Pre Score 18/24             Core Components/Risk Factors/Patient Goals at Admission:  Personal Goals and Risk Factors at Admission - 07/12/21 1214       Core Components/Risk  Factors/Patient Goals on Admission    Weight Management Yes;Weight Loss    Intervention Weight Management: Develop a combined nutrition and exercise program designed to reach desired caloric intake, while maintaining appropriate intake of nutrient and fiber, sodium and fats, and appropriate energy expenditure required for the weight goal.;Weight Management: Provide education and appropriate resources to help participant work on and attain dietary goals.    Admit Weight 180 lb 5.4 oz (81.8 kg)    Expected Outcomes Short Term: Continue to assess and modify interventions until short term weight is achieved;Long Term: Adherence to nutrition and physical activity/exercise program aimed toward attainment of established weight goal;Weight Maintenance: Understanding of the daily nutrition guidelines, which includes 25-35% calories from fat, 7% or less cal from saturated fats, less than 265m cholesterol, less than 1.5gm of sodium, & 5 or more servings of fruits and vegetables daily;Weight Loss: Understanding of general recommendations for a balanced deficit meal plan, which promotes 1-2 lb weight loss per week and includes a negative energy balance of [phone] kcal/d;Understanding recommendations for meals to include 15-35% energy as protein, 25-35% energy from fat, 35-60% energy from carbohydrates, less than 2071mof dietary cholesterol, 20-35 gm of total fiber daily;Understanding of distribution of calorie intake throughout the day with the consumption of 4-5 meals/snacks    Diabetes Yes    Intervention Provide education about signs/symptoms and action to take for hypo/hyperglycemia.;Provide education about proper nutrition, including hydration, and aerobic/resistive exercise prescription along with prescribed medications to achieve blood glucose in normal ranges: Fasting glucose 65-99 mg/dL    Expected Outcomes Short Term: Participant verbalizes understanding of the signs/symptoms and immediate care of  hyper/hypoglycemia, proper foot care and importance of medication, aerobic/resistive exercise and nutrition plan for blood glucose control.;Long Term: Attainment of HbA1C < 7%.    Hypertension Yes    Intervention Provide education on lifestyle modifcations including regular physical activity/exercise, weight management, moderate sodium restriction and increased consumption of fresh fruit, vegetables, and low fat dairy, alcohol moderation, and smoking cessation.;Monitor prescription use compliance.    Expected Outcomes Short Term: Continued assessment and intervention until BP is < 140/9015mG in hypertensive participants. < 130/71m12m in hypertensive participants with diabetes, heart failure or chronic kidney disease.;Long Term: Maintenance of blood pressure at goal levels.    Lipids Yes    Intervention Provide  education and support for participant on nutrition & aerobic/resistive exercise along with prescribed medications to achieve LDL <27m, HDL >459m    Expected Outcomes Short Term: Participant states understanding of desired cholesterol values and is compliant with medications prescribed. Participant is following exercise prescription and nutrition guidelines.;Long Term: Cholesterol controlled with medications as prescribed, with individualized exercise RX and with personalized nutrition plan. Value goals: LDL < 7068mHDL > 40 mg.             Core Components/Risk Factors/Patient Goals Review:   Goals and Risk Factor Review     Row Name 07/18/21 1213 08/09/21 1617 09/06/21 1410         Core Components/Risk Factors/Patient Goals Review   Personal Goals Review Weight Management/Obesity;Stress;Hypertension;Lipids;Diabetes Weight Management/Obesity;Stress;Hypertension;Lipids;Diabetes Weight Management/Obesity;Stress;Hypertension;Lipids;Diabetes     Review Cian did well with exercise on his first day for his fitness level. Vital signs and CBG's were stable RicIsiahs been doing well with  exercisre at cardiac rehab  for his fitness level. Vital signs and CBG's have been stable. RicPoseidon enjoying participating in the program and reports feeling stonger. RicJaciels been doing well with exercisre at cardiac rehab  for his fitness level. Vital signs and CBG's have been stable. Nico's exercise is currently on hold due to fall at home.     Expected Outcomes RicToniell continue to participate in phase 2 cardiac rehab for exercise, nutrition and lifestyle modification RicCyprianll continue to participate in phase 2 cardiac rehab for exercise, nutrition and lifestyle modification RicAquanll continue to participate in phase 2 cardiac rehab for exercise, nutrition and lifestyle modification when cleared to return to exercise.              Core Components/Risk Factors/Patient Goals at Discharge (Final Review):   Goals and Risk Factor Review - 09/06/21 1410       Core Components/Risk Factors/Patient Goals Review   Personal Goals Review Weight Management/Obesity;Stress;Hypertension;Lipids;Diabetes    Review RicBonhams been doing well with exercisre at cardiac rehab  for his fitness level. Vital signs and CBG's have been stable. Naseem's exercise is currently on hold due to fall at home.    Expected Outcomes RicTyanll continue to participate in phase 2 cardiac rehab for exercise, nutrition and lifestyle modification when cleared to return to exercise.             ITP Comments:  ITP Comments     Row Name 07/12/21 1237 07/18/21 1209 08/09/21 1615 09/06/21 1407     ITP Comments Dr TraFransico Him, Medical Director 30 day ITP Review. Essie started exercise at cardiac rehab on 07/18/21 and did well with exercise for his fitness level. 30 day ITP Review. Aniello has good attendance and participation in phase 2 cardiac rehab. 30 day ITP Review. Kit's exercise is currently on hold as he fell at home and needs  MD clearance to return.             Comments: See ITP  comments.MarHarrell Gave BSN

## 2021-09-07 ENCOUNTER — Encounter (HOSPITAL_COMMUNITY): Payer: No Typology Code available for payment source

## 2021-09-09 ENCOUNTER — Encounter (HOSPITAL_COMMUNITY): Payer: No Typology Code available for payment source

## 2021-09-15 NOTE — Progress Notes (Signed)
Discharge Progress Report  Patient Details  Name: Jerry Moran MRN: [id number] Date of Birth: September 08, 1955 Referring Provider:   Flowsheet Row CARDIAC REHAB PHASE II ORIENTATION from 07/12/2021 in [place]  Referring Provider Daneen Schick, MD        Number of Visits: 17  Reason for Discharge:  Patient reached a stable level of exercise.  Smoking History:  Social History   Tobacco Use  Smoking Status Former   Types: Cigarettes  Smokeless Tobacco Never  Tobacco Comments   Smoked cigarettes as a teenager    Diagnosis:  04/28/21 NSTEMI  04/29/21 S/P DES RCA  ADL UCSD:   Initial Exercise Prescription:  Initial Exercise Prescription - 07/12/21 1200       Date of Initial Exercise RX and Referring Provider   Date 07/12/21    Referring Provider Daneen Schick, MD    Expected Discharge Date 09/09/21      NuStep   Level 2    SPM 75    Minutes 30    METs 2      Prescription Details   Frequency (times per week) 3    Duration Progress to 30 minutes of continuous aerobic without signs/symptoms of physical distress      Intensity   THRR 40-80% of Max Heartrate 61-122    Ratings of Perceived Exertion 11-13    Perceived Dyspnea 0-4      Progression   Progression Continue progressive overload as per policy without signs/symptoms or physical distress.      Resistance Training   Training Prescription Yes    Weight 3 lbs    Reps 10-15             Discharge Exercise Prescription (Final Exercise Prescription Changes):  Exercise Prescription Changes - 08/26/21 0830       Response to Exercise   Blood Pressure (Admit) 122/70    Blood Pressure (Exercise) 122/70    Blood Pressure (Exit) 124/78    Heart Rate (Admit) 62 bpm    Heart Rate (Exercise) 80 bpm    Heart Rate (Exit) 62 bpm    Rating of Perceived Exertion (Exercise) 11    Symptoms None    Comments Pt's last day of exercise in the [facility] program    Duration Continue with  30 min of aerobic exercise without signs/symptoms of physical distress.    Intensity THRR unchanged      Progression   Progression Continue to progress workloads to maintain intensity without signs/symptoms of physical distress.    Average METs 1.6      Resistance Training   Training Prescription No      Interval Training   Interval Training No      NuStep   Level 2    Minutes 30    METs 1.6      Home Exercise Plan   Plans to continue exercise at Home (comment)    Frequency Add 4 additional days to program exercise sessions.    Initial Home Exercises Provided 08/17/21             Functional Capacity:  6 Minute Walk     Row Name 07/12/21 1103         6 Minute Walk   Distance 1312 feet  Performed on Nustep     Walk Time 6 minutes     # of Rest Breaks 0     MPH 2.49     METS 2.94     RPE  11     Perceived Dyspnea  0     VO2 Peak 10.3     Symptoms No     Resting HR 75 bpm     Resting BP 144/80     Resting Oxygen Saturation  100 %     Exercise Oxygen Saturation  during 6 min walk 100 %     Max Ex. HR 84 bpm     Max Ex. BP 148/82     2 Minute Post BP 120/74              Psychological, QOL, Others - Outcomes: PHQ 2/9:    07/12/2021    9:07 AM  Depression screen PHQ 2/9  Decreased Interest 0  Down, Depressed, Hopeless 0  PHQ - 2 Score 0    Quality of Life:  Quality of Life - 07/12/21 1014       Quality of Life   Select Quality of Life      Quality of Life Scores   Health/Function Pre 16.57 %    Socioeconomic Pre 23.64 %    Psych/Spiritual Pre 20.71 %    Family Pre 28.3 %    GLOBAL Pre 20.73 %             Personal Goals: Goals established at orientation with interventions provided to work toward goal.  Personal Goals and Risk Factors at Admission - 07/12/21 1214       Core Components/Risk Factors/Patient Goals on Admission    Weight Management Yes;Weight Loss    Intervention Weight Management: Develop a combined nutrition and  exercise program designed to reach desired caloric intake, while maintaining appropriate intake of nutrient and fiber, sodium and fats, and appropriate energy expenditure required for the weight goal.;Weight Management: Provide education and appropriate resources to help participant work on and attain dietary goals.    Admit Weight 180 lb 5.4 oz (81.8 kg)    Expected Outcomes Short Term: Continue to assess and modify interventions until short term weight is achieved;Long Term: Adherence to nutrition and physical activity/exercise program aimed toward attainment of established weight goal;Weight Maintenance: Understanding of the daily nutrition guidelines, which includes 25-35% calories from fat, 7% or less cal from saturated fats, less than 277m cholesterol, less than 1.5gm of sodium, & 5 or more servings of fruits and vegetables daily;Weight Loss: Understanding of general recommendations for a balanced deficit meal plan, which promotes 1-2 lb weight loss per week and includes a negative energy balance of [id number] kcal/d;Understanding recommendations for meals to include 15-35% energy as protein, 25-35% energy from fat, 35-60% energy from carbohydrates, less than 2052mof dietary cholesterol, 20-35 gm of total fiber daily;Understanding of distribution of calorie intake throughout the day with the consumption of 4-5 meals/snacks    Diabetes Yes    Intervention Provide education about signs/symptoms and action to take for hypo/hyperglycemia.;Provide education about proper nutrition, including hydration, and aerobic/resistive exercise prescription along with prescribed medications to achieve blood glucose in normal ranges: Fasting glucose 65-99 mg/dL    Expected Outcomes Short Term: Participant verbalizes understanding of the signs/symptoms and immediate care of hyper/hypoglycemia, proper foot care and importance of medication, aerobic/resistive exercise and nutrition plan for blood glucose control.;Long Term:  Attainment of HbA1C < 7%.    Hypertension Yes    Intervention Provide education on lifestyle modifcations including regular physical activity/exercise, weight management, moderate sodium restriction and increased consumption of fresh fruit, vegetables, and low fat dairy, alcohol moderation, and smoking cessation.;Monitor prescription use compliance.  Expected Outcomes Short Term: Continued assessment and intervention until BP is < 140/62m HG in hypertensive participants. < 130/852mHG in hypertensive participants with diabetes, heart failure or chronic kidney disease.;Long Term: Maintenance of blood pressure at goal levels.    Lipids Yes    Intervention Provide education and support for participant on nutrition & aerobic/resistive exercise along with prescribed medications to achieve LDL <7070mHDL >21m49m  Expected Outcomes Short Term: Participant states understanding of desired cholesterol values and is compliant with medications prescribed. Participant is following exercise prescription and nutrition guidelines.;Long Term: Cholesterol controlled with medications as prescribed, with individualized exercise RX and with personalized nutrition plan. Value goals: LDL < 70mg43mL > 40 mg.              Personal Goals Discharge:  Goals and Risk Factor Review     Row Name 07/18/21 1213 08/09/21 1617 09/06/21 1410         Core Components/Risk Factors/Patient Goals Review   Personal Goals Review Weight Management/Obesity;Stress;Hypertension;Lipids;Diabetes Weight Management/Obesity;Stress;Hypertension;Lipids;Diabetes Weight Management/Obesity;Stress;Hypertension;Lipids;Diabetes     Review Jeanpierre did well with exercise on his first day for his fitness level. Vital signs and CBG's were stable RichaAgronbeen doing well with exercisre at cardiac rehab  for his fitness level. Vital signs and CBG's have been stable. RichaAmaranjoying participating in the program and reports feeling stonger. RichaMayson been doing well with exercisre at cardiac rehab  for his fitness level. Vital signs and CBG's have been stable. Giavanni's exercise is currently on hold due to fall at home.     Expected Outcomes RichaChristine continue to participate in phase 2 cardiac rehab for exercise, nutrition and lifestyle modification RichaAutry continue to participate in phase 2 cardiac rehab for exercise, nutrition and lifestyle modification RichaNathanyl continue to participate in phase 2 cardiac rehab for exercise, nutrition and lifestyle modification when cleared to return to exercise.              Exercise Goals and Review:  Exercise Goals     Row Name 07/12/21 1110             Exercise Goals   Increase Physical Activity Yes       Intervention Provide advice, education, support and counseling about physical activity/exercise needs.;Develop an individualized exercise prescription for aerobic and resistive training based on initial evaluation findings, risk stratification, comorbidities and participant's personal goals.       Expected Outcomes Short Term: Attend rehab on a regular basis to increase amount of physical activity.;Long Term: Add in home exercise to make exercise part of routine and to increase amount of physical activity.;Long Term: Exercising regularly at least 3-5 days a week.       Increase Strength and Stamina Yes       Intervention Provide advice, education, support and counseling about physical activity/exercise needs.;Develop an individualized exercise prescription for aerobic and resistive training based on initial evaluation findings, risk stratification, comorbidities and participant's personal goals.       Expected Outcomes Short Term: Increase workloads from initial exercise prescription for resistance, speed, and METs.;Short Term: Perform resistance training exercises routinely during rehab and add in resistance training at home;Long Term: Improve cardiorespiratory fitness, muscular  endurance and strength as measured by increased METs and functional capacity (6MWT)       Able to understand and use rate of perceived exertion (RPE) scale Yes       Intervention Provide education and explanation on how  to use RPE scale       Expected Outcomes Short Term: Able to use RPE daily in rehab to express subjective intensity level;Long Term:  Able to use RPE to guide intensity level when exercising independently       Intervention Provide education and explanation of THRR including how the numbers were predicted and where they are located for reference       Expected Outcomes Short Term: Able to use daily as guideline for intensity in rehab;Long Term: Able to use THRR to govern intensity when exercising independently       Understanding of Exercise Prescription Yes       Intervention Provide education, explanation, and written materials on patient's individual exercise prescription       Expected Outcomes Short Term: Able to explain program exercise prescription;Long Term: Able to explain home exercise prescription to exercise independently                Exercise Goals Re-Evaluation:  Exercise Goals Re-Evaluation     Baxter Name 07/18/21 1004 08/17/21 1124           Exercise Goal Re-Evaluation   Exercise Goals Review Increase Physical Activity;Increase Strength and Stamina;Able to understand and use rate of perceived exertion (RPE) scale;Knowledge and understanding of Target Heart Rate Range (THRR);Understanding of Exercise Prescription Increase Physical Activity;Increase Strength and Stamina;Able to understand and use rate of perceived exertion (RPE) scale;Knowledge and understanding of Target Heart Rate Range (THRR);Understanding of Exercise Prescription      Comments Pt's first day in the [facility] program. Pt understnads the exercise Rx, THRR, and RPE scale. Reviewed METs, goals and home exercise Rx. Pt voices that he is walking at home and is now able to walk the full block. Pt  vocices his stamina for walking has improved which is a patient goal.      Expected Outcomes Will continue to monitor patiant and progress exercise workloads as tolerated. Pt will continue to walk at home 5x/week with the intent to reach a total of 30 minutes, divided into 2-3 bouts of 10-15 minutes.               Nutrition & Weight - Outcomes:  Pre Biometrics - 07/12/21 1013       Pre Biometrics   Waist Circumference 40 inches    Hip Circumference 41 inches    Waist to Hip Ratio 0.98 %    Triceps Skinfold 12 mm    % Body Fat 27 %    Grip Strength 27 kg    Flexibility 9 in    Single Leg Stand --   Not perfomed: S/P CVA, uses cane             Nutrition:  Nutrition Therapy & Goals - 08/17/21 0919       Nutrition Therapy   Diet Heart healthy diet    Drug/Food Interactions Statins/Certain Fruits      Personal Nutrition Goals   Nutrition Goal Patient to identify food sources of saturated fat. Decrease bacon/sausage intake to <3x/week.    Personal Goal #2 Patient to describe the benefit of including fruits, vegetables, whole grains, lean protein/plant protein, and low fat dairy as part of heart healthy meal plan    Personal Goal #3 Pair protein + carbohydrate together at meals and snacks to support blood sugar control.   in progress.   Comments Reviewed blood sugar control strategies. Encouraged inceasing fiber intake through fruits and vegetables to support heart health and blood  sugar; reviewed the plate method.      Intervention Plan   Intervention Prescribe, educate and counsel regarding individualized specific dietary modifications aiming towards targeted core components such as weight, hypertension, lipid management, diabetes, heart failure and other comorbidities.    Expected Outcomes Short Term Goal: Understand basic principles of dietary content, such as calories, fat, sodium, cholesterol and nutrients.;Long Term Goal: Adherence to prescribed nutrition plan.              Nutrition Discharge:  Nutrition Assessments - 07/20/21 0927       Rate Your Plate Scores   Pre Score 44             Education Questionnaire Score:  Knowledge Questionnaire Score - 07/12/21 1014       Knowledge Questionnaire Score   Pre Score 18/24             Treasure attended 17 exercise sessions between 07/18/21- 08/26/21. Jaegar did well with exercise for his fitness level and enjoyed participating in phase 2 cardiac rehab. Jerel increased his met level from 1.4 to 1.8. Victorino fell at home and did not return to complete the program. We enjoyed working with Delfino Lovett and wish him the best.Cory Kitt Miguel Rota RN BSN

## 2021-09-15 NOTE — Addendum Note (Signed)
Encounter addended by: Magda Kiel, RN on: 09/15/2021 3:15 PM  Actions taken: Pend clinical note

## 2021-09-16 NOTE — Addendum Note (Signed)
Encounter addended by: Lesly Rubenstein on: 09/16/2021 8:40 AM  Actions taken: Flowsheet accepted

## 2021-09-16 NOTE — Addendum Note (Signed)
Encounter addended by: Lesly Rubenstein on: 09/16/2021 8:35 AM  Actions taken: Flowsheet data copied forward, Flowsheet accepted

## 2021-10-18 ENCOUNTER — Telehealth: Payer: Self-pay | Admitting: *Deleted

## 2021-10-18 NOTE — Progress Notes (Signed)
Office Visit    Patient Name: Jerry Moran Date of Encounter: 10/20/2021  Primary Care Provider:  Clinic, [facility] Primary Cardiologist:  Sinclair Grooms, MD Primary Electrophysiologist: None  Chief Complaint    Jerry Moran is a 66 y.o. male with PMH of CAD s/p NSTEMI 04/2021 treated with DES to RCA with residual multivessel CAD and moderate LAD, LCx and posterior OM1 with evidence of severe distal OM bifurcation, recent CVA in 2009 and 2020, DM type II, history of prostate CA, HTN who presents today for evaluation of syncope.  Past Medical History    Past Medical History:  Diagnosis Date   Coronary artery disease    Diabetes mellitus without complication ([place])    Hyperlipidemia    Hypertension    Prostate cancer ([place])    Stroke [facility])    Past Surgical History:  Procedure Laterality Date   CARDIAC CATHETERIZATION     CORONARY STENT INTERVENTION N/A 04/29/2021   Procedure: CORONARY STENT INTERVENTION;  Surgeon: Burnell Blanks, MD;  Location: [place] [facility];  Service: Cardiovascular;  Laterality: N/A;   INTRAVASCULAR PRESSURE WIRE/FFR STUDY N/A 04/29/2021   Procedure: INTRAVASCULAR PRESSURE WIRE/FFR STUDY;  Surgeon: Burnell Blanks, MD;  Location: [place] [facility];  Service: Cardiovascular;  Laterality: N/A;   LEFT HEART CATH AND CORONARY ANGIOGRAPHY N/A 04/29/2021   Procedure: LEFT HEART CATH AND CORONARY ANGIOGRAPHY;  Surgeon: Burnell Blanks, MD;  Location: [facility];  Service: Cardiovascular;  Laterality: N/A;   PROSTATE BIOPSY     Allergies  No Known Allergies  History of Present Illness    Jerry Moran is a 66 year old male with the above-mentioned past medical history who presents today for evaluation of syncope.  He has a history of CVA -2009 and 2020 with Left paramedian pontine infarct secondary to small vessel disease and 2020 Left para median pons. Mr. Jerry Moran presented to the ED via EMS on 04/28/2021  with complaint of substernal chest pain that was not reproducible.  Patient's ECG was unremarkable on arrival to ED and was given 324 mg ASA.  He was reported to be hypertensive with systolic blood pressures in the 140s to 170s and creatinine of 1.8.  High-sensitivity troponins trended at 321>>311 and patient was started on heparin drip.  Patient underwent LHC with DES to distal RCA and severe RCA stenosis, moderate LAD disease, and severe distal OM branch disease.  He was started on Brilinta and continue on ASA 81 mg for 1 year.  TTE was performed with EF of 60 to 65%, no RWMA, no LVH, and normal biatrial size.  Patient was last seen 05/2021 for follow-up after NSTEMI and reported no chest pain or shortness of breath.  He participated in cardiac rehab and unfortunately fell and broke two toes after falling down stairs. Patient's wife recently contacted the office due to a syncopal episode that occurred in [place] on 10/14/2021. He was treated with IV fluids and   Since last being seen in the office patient reports that he is doing better.  He presents today with his wife for today's visit.  He was in [place] last week for a church function with his wife when he had a witnessed syncopal episode.  This occurred while sitting and patient recovered quickly after episode.  He was transported to the emergency room and treated with IV hydration and discharged.  He states that during his trip he was not taking his medications and also had  not increased his fluid intake while being in the sun.  I discussed at length the importance of adhering to his medications especially his Brilinta and ASA.  Patient denies chest pain, palpitations, dyspnea, PND, orthopnea, nausea, vomiting, dizziness, syncope, edema, weight gain, or early satiety.   Home Medications    Current Outpatient Medications  Medication Sig Dispense Refill   amLODipine (NORVASC) 10 MG tablet Take 1 tablet (10 mg total) by mouth daily. 30 tablet 0    atorvastatin (LIPITOR) 80 MG tablet Take 1 tablet (80 mg total) by mouth at bedtime. 90 tablet 3   BAYER LOW DOSE 81 MG EC tablet Take 81 mg by mouth daily. Swallow whole.     blood glucose meter kit and supplies KIT Dispense based on patient and insurance preference. Use up to four times daily as directed. (FOR ICD-9 250.00, 250.01). 1 each 0   carvedilol (COREG) 25 MG tablet Take 1 tablet (25 mg total) by mouth 2 (two) times daily with a meal. 60 tablet 6   chlorthalidone (HYGROTON) 25 MG tablet Take 25 mg by mouth daily.     glipiZIDE (GLUCOTROL) 5 MG tablet Take 5 mg by mouth 2 (two) times daily before a meal.     lisinopril (ZESTRIL) 40 MG tablet Take 40 mg by mouth every morning.     metFORMIN (GLUCOPHAGE) 1000 MG tablet Take 1,000 mg by mouth 2 (two) times daily with a meal.     nitroGLYCERIN (NITROSTAT) 0.4 MG SL tablet Place 1 tablet (0.4 mg total) under the tongue every 5 (five) minutes as needed for chest pain. 25 tablet 3   tamsulosin (FLOMAX) 0.4 MG CAPS capsule Take 0.4 mg by mouth daily.     ticagrelor (BRILINTA) 90 MG TABS tablet Take 1 tablet (90 mg total) by mouth 2 (two) times daily. 60 tablet 6   No current facility-administered medications for this visit.     Review of Systems  Please see the history of present illness.     (+) Chronic lower edema swelling  All other systems reviewed and are otherwise negative except as noted above.  Physical Exam    Wt Readings from Last 3 Encounters:  10/20/21 182 lb 12.8 oz (82.9 kg)  07/12/21 180 lb 5.4 oz (81.8 kg)  05/11/21 178 lb 12.8 oz (81.1 kg)   VS: Vitals:   10/20/21 0822  BP: 132/70  Pulse: 71  SpO2: 99%  ,Body mass index is 27.79 kg/m.  Constitutional:      Appearance: Healthy appearance. Not in distress.  Neck:     Vascular: JVD normal.  Pulmonary:     Effort: Pulmonary effort is normal.     Breath sounds: No wheezing. No rales. Diminished in the bases Cardiovascular:     Normal rate. Regular  rhythm. Normal S1. Normal S2.      Murmurs: There is no murmur.  Edema:    Peripheral edema absent.  Abdominal:     Palpations: Abdomen is soft non tender. There is no hepatomegaly.  Skin:    General: Skin is warm and dry.  Neurological:     General: No focal deficit present.     Mental Status: Alert and oriented to person, place and time.     Cranial Nerves: Cranial nerves are intact.  EKG/LABS/Other Studies Reviewed    ECG personally reviewed by me today -sinus rhythm with rate of 71 and right bundle branch block and no acute changes  Lab Results  Component Value Date  WBC 6.5 04/30/2021   HGB 10.6 (L) 04/30/2021   HCT 30.6 (L) 04/30/2021   MCV 88.2 04/30/2021   PLT 203 04/30/2021   Lab Results  Component Value Date   CREATININE 1.58 (H) 05/26/2021   BUN 19 05/26/2021   NA 139 05/26/2021   K 3.8 05/26/2021   CL 105 05/26/2021   CO2 21 05/26/2021   Lab Results  Component Value Date   ALT 21 05/09/2019   AST 20 05/09/2019   ALKPHOS 62 05/09/2019   BILITOT 1.1 05/09/2019   Lab Results  Component Value Date   CHOL 148 04/29/2021   HDL 40 (L) 04/29/2021   LDLCALC 91 04/29/2021   TRIG 83 04/29/2021   CHOLHDL 3.7 04/29/2021    Lab Results  Component Value Date   HGBA1C 6.0 (H) 04/29/2021    Assessment & Plan    1.  Syncope: -Mr. Bey experienced a syncopal episode while at church function.  He was treated with IV fluids and released without any further work-up..  Today he presents with no residual effects from his syncopal event -We will have patient wear a 14-day ZIO monitor to evaluate possible arrhythmia related to syncopal episode -We will check CBC, BMET, TSH, and Mg today  -Per [place] DMV guidelines, advised no driving for 6 months, will need to be cleared by our office prior to returning.  Patient stated that he is currently not driving and will abide by these guidelines.   2.  History of CVA: -2009 and 01/2019 L paramedian pontine infarct secondary to  small vessel disease and L para median pons. -Continue aspirin and Lipitor 80 mg daily -Patient will have carotid ultrasounds to rule out possible stenosis related to recent syncopal episode   3.  Hyperlipidemia: -Last LDL was was 91 above goal of less than 70 Continue statin therapy as noted above  4.  Hypertension: -Patient's blood pressure today was well controlled at 132/70 -Continue lisinopril 40 mg daily, carvedilol 25 mg twice daily and amlodipine 10 mg daily  5.  DM type II: -Patient's last hemoglobin A1c was 6.0 -Continue metformin 1000 mg twice daily and glipizide 5 mg daily -Patient may benefit from SGLT2 inhibitor due to renal insufficiency  6.  CKD stage IIIa: -Most recent creatinine was 1.58  7.  Coronary artery disease: - s/p NSTEMI 04/2021 which revealed multivessel CAD with moderate LAD, LCx, and pOM1 with evidence of severe stenosis in distal OM1/bifurcation which were medically managed.  He was found to have 99% distal RCA stenosis which was managed with PCI/DES. -Patient denies any chest pain or shortness of breath today -Continue ASA and Brilinta, atorvastatin 80 mg, carvedilol 25 mg twice daily  Disposition: Follow-up with Belva Crome III, MD or APP in 1 months    Medication Adjustments/Labs and Tests Ordered: Current medicines are reviewed at length with the patient today.  Concerns regarding medicines are outlined above.   Signed, Mable Fill, Marissa Nestle, NP 10/20/2021, 9:28 AM [place]

## 2021-10-18 NOTE — Telephone Encounter (Signed)
Forms in Dr. Camillia Herter mailbox.  The patient is assigned to Dr. Tamala Julian.  I reviewed the paperwork in the box. It is an [place] ED from 10/14/21.  The patient was seen there for syncope.  Had labs and was given IV fluids.    Called and spoke with patient's wife. I have scheduled the patient for an overdue follow up with APP on 10/20/21.  He was to follow up with Dr. Tamala Julian in May but was a no show for that visit. The patient is in the background on the phone and states that he is feeling okay today.  The doctors at the ED felt he was "just dehydrated."    Notes placed in Ernest's mailbox for review.

## 2021-10-20 ENCOUNTER — Encounter: Payer: Self-pay | Admitting: Nurse Practitioner

## 2021-10-20 ENCOUNTER — Ambulatory Visit (INDEPENDENT_AMBULATORY_CARE_PROVIDER_SITE_OTHER): Payer: No Typology Code available for payment source | Admitting: Nurse Practitioner

## 2021-10-20 ENCOUNTER — Ambulatory Visit (INDEPENDENT_AMBULATORY_CARE_PROVIDER_SITE_OTHER): Payer: No Typology Code available for payment source

## 2021-10-20 VITALS — BP 132/70 | HR 71 | Ht 68.0 in | Wt 182.8 lb

## 2021-10-20 DIAGNOSIS — R55 Syncope and collapse: Secondary | ICD-10-CM | POA: Diagnosis not present

## 2021-10-20 DIAGNOSIS — N1831 Chronic kidney disease, stage 3a: Secondary | ICD-10-CM

## 2021-10-20 DIAGNOSIS — I10 Essential (primary) hypertension: Secondary | ICD-10-CM

## 2021-10-20 DIAGNOSIS — E785 Hyperlipidemia, unspecified: Secondary | ICD-10-CM | POA: Diagnosis not present

## 2021-10-20 DIAGNOSIS — Z8673 Personal history of transient ischemic attack (TIA), and cerebral infarction without residual deficits: Secondary | ICD-10-CM | POA: Diagnosis not present

## 2021-10-20 DIAGNOSIS — I2510 Atherosclerotic heart disease of native coronary artery without angina pectoris: Secondary | ICD-10-CM

## 2021-10-20 DIAGNOSIS — E119 Type 2 diabetes mellitus without complications: Secondary | ICD-10-CM

## 2021-10-20 NOTE — Progress Notes (Unsigned)
Enrolled patient for a 14 day Zio XT monitor to be mailed to patients home  Dr. Smith to read 

## 2021-10-20 NOTE — Patient Instructions (Signed)
Medication Instructions:  Your physician recommends that you continue on your current medications as directed. Please refer to the Current Medication list given to you today.  *If you need a refill on your cardiac medications before your next appointment, please call your pharmacy*   Lab Work: Mag,  TSH, Bmet, CBC If you have labs (blood work) drawn today and your tests are completely normal, you will receive your results only by: [place] (if you have MyChart) OR A paper copy in the mail If you have any lab test that is abnormal or we need to change your treatment, we will call you to review the results.   Testing/Procedures: Heart Monitor Your physician has recommended that you wear an event monitor. Event monitors are medical devices that record the heart's electrical activity. Doctors most often [place] these monitors to diagnose arrhythmias. Arrhythmias are problems with the speed or rhythm of the heartbeat. The monitor is a small, portable device. You can wear one while you do your normal daily activities. This is usually used to diagnose what is causing palpitations/syncope (passing out).   Carotid US Your physician has requested that you have a carotid duplex. This test is an ultrasound of the carotid arteries in your neck. It looks at blood flow through these arteries that supply the brain with blood. Allow one hour for this exam. There are no restrictions or special instructions.    Follow-Up: At [facility], you and your health needs are our priority.  As part of our continuing mission to provide you with exceptional heart care, we have created designated Provider Care Teams.  These Care Teams include your primary Cardiologist (physician) and Advanced Practice Providers (APPs -  Physician Assistants and Nurse Practitioners) who all work together to provide you with the care you need, when you need it.  We recommend signing up for the patient portal called "MyChart".  Sign up  information is provided on this After Visit Summary.  MyChart is used to connect with patients for Virtual Visits (Telemedicine).  Patients are able to view lab/test results, encounter notes, upcoming appointments, etc.  Non-urgent messages can be sent to your provider as well.   To learn more about what you can do with MyChart, go to [email or web address].    Your next appointment:   1 month(s)  The format for your next appointment:   In Person  Provider:   Ambrose Pancoast, NP         Other Instructions [place] Monitor Instructions  Your physician has requested you wear a ZIO patch monitor for 14 days.  This is a single patch monitor. [employer] supplies one patch monitor per enrollment. Additional stickers are not available. Please do not apply patch if you will be having a Nuclear Stress Test,  Echocardiogram, Cardiac CT, MRI, or Chest Xray during the period you would be wearing the  monitor. The patch cannot be worn during these tests. You cannot remove and re-apply the  ZIO XT patch monitor.  Your ZIO patch monitor will be mailed 3 day USPS to your address on file. It may take 3-5 days  to receive your monitor after you have been enrolled.  Once you have received your monitor, please review the enclosed instructions. Your monitor  has already been registered assigning a specific monitor serial # to you.  Billing and Patient Assistance Program Information  We have supplied [employer] with any of your insurance information on file for billing purposes. [employer] offers a sliding  scale Patient Assistance Program for patients that do not have  insurance, or whose insurance does not completely cover the cost of the ZIO monitor.  You must apply for the Patient Assistance Program to qualify for this discounted rate.  To apply, please call [employer] at [phone], select option 4, select option 2, ask to apply for  Patient Assistance Program. Theodore Demark will ask your household income, and  how many people  are in your household. They will quote your out-of-pocket cost based on that information.  [employer] will also be able to set up a 60-month interest-free payment plan if needed.  Applying the monitor   Shave hair from upper left chest.  Hold abrader disc by orange tab. Rub abrader in 40 strokes over the upper left chest as  indicated in your monitor instructions.  Clean area with 4 enclosed alcohol pads. Let dry.  Apply patch as indicated in monitor instructions. Patch will be placed under collarbone on left  side of chest with arrow pointing upward.  Rub patch adhesive wings for 2 minutes. Remove white label marked "1". Remove the white  label marked "2". Rub patch adhesive wings for 2 additional minutes.  While looking in a mirror, press and release button in center of patch. A small green light will  flash 3-4 times. This will be your only indicator that the monitor has been turned on.  Do not shower for the first 24 hours. You may shower after the first 24 hours.  Press the button if you feel a symptom. You will hear a small click. Record Date, Time and  Symptom in the Patient Logbook.  When you are ready to remove the patch, follow instructions on the last 2 pages of Patient  Logbook. Stick patch monitor onto the last page of Patient Logbook.  Place Patient Logbook in the blue and white box. Use locking tab on box and tape box closed  securely. The blue and white box has prepaid postage on it. Please place it in the mailbox as  soon as possible. Your physician should have your test results approximately 7 days after the  monitor has been [facility] back [facility]  Call [place] [phone] you have questions regarding  your ZIO XT patch monitor. Call them immediately if you see an orange light blinking on your  monitor.  If your monitor falls off in less than 4 days, contact our Monitor department at [phone]  If your monitor  becomes loose or falls off after 4 days call [employer] at [phone]  suggestions on securing your monitor

## 2021-10-21 LAB — CBC
Hematocrit: 28.7 % — ABNORMAL LOW (ref 37.5–51.0)
Hemoglobin: 9 g/dL — ABNORMAL LOW (ref 13.0–17.7)
MCH: 28.7 pg (ref 26.6–33.0)
MCHC: 31.4 g/dL — ABNORMAL LOW (ref 31.5–35.7)
MCV: 91 fL (ref 79–97)
Platelets: 264 10*3/uL (ref 150–450)
RBC: 3.14 x10E6/uL — ABNORMAL LOW (ref 4.14–5.80)
RDW: 12.9 % (ref 11.6–15.4)
WBC: 7.5 10*3/uL (ref 3.4–10.8)

## 2021-10-21 LAB — BASIC METABOLIC PANEL
BUN/Creatinine Ratio: 12 (ref 10–24)
BUN: 20 mg/dL (ref 8–27)
CO2: 20 mmol/L (ref 20–29)
Calcium: 9 mg/dL (ref 8.6–10.2)
Chloride: 105 mmol/L (ref 96–106)
Creatinine, Ser: 1.69 mg/dL — ABNORMAL HIGH (ref 0.76–1.27)
Glucose: 165 mg/dL — ABNORMAL HIGH (ref 70–99)
Potassium: 3.8 mmol/L (ref 3.5–5.2)
Sodium: 139 mmol/L (ref 134–144)
eGFR: 44 mL/min/{1.73_m2} — ABNORMAL LOW (ref >59)

## 2021-10-21 LAB — MAGNESIUM: Magnesium: 1.9 mg/dL (ref 1.6–2.3)

## 2021-10-21 LAB — TSH: TSH: 1.5 u[IU]/mL (ref 0.450–4.500)

## 2021-10-23 DIAGNOSIS — R55 Syncope and collapse: Secondary | ICD-10-CM

## 2021-11-01 ENCOUNTER — Ambulatory Visit (HOSPITAL_COMMUNITY)
Admission: RE | Admit: 2021-11-01 | Discharge: 2021-11-01 | Disposition: A | Discharge status: Home / Self Care | Payer: No Typology Code available for payment source | Source: Ambulatory Visit | Attending: Cardiology | Admitting: Cardiology

## 2021-11-01 DIAGNOSIS — R55 Syncope and collapse: Secondary | ICD-10-CM | POA: Diagnosis not present

## 2021-11-16 NOTE — Progress Notes (Signed)
Office Visit    Patient Name: Jerry Moran Date of Encounter: 11/22/2021  Primary Care Provider:  [facility] Primary Cardiologist:  Sinclair Grooms, MD Primary Electrophysiologist: None  Chief Complaint    [name] is a 66 y.o. male with PMH of CAD s/p NSTEMI 04/2021 treated with DES to RCA with residual multivessel CAD and moderate LAD, LCx and posterior OM1 with evidence of severe distal OM bifurcation, recent CVA in 2009 and 2020, DM type II, history of prostate CA, HTN who presents today for 1 month follow-up of syncope.  Past Medical History    Past Medical History:  Diagnosis Date   Coronary artery disease    Diabetes mellitus without complication ([place])    Hyperlipidemia    Hypertension    Prostate cancer ([place])    Stroke [facility])    Past Surgical History:  Procedure Laterality Date   CARDIAC CATHETERIZATION     CORONARY STENT INTERVENTION N/A 04/29/2021   Procedure: CORONARY STENT INTERVENTION;  Surgeon: Burnell Blanks, MD;  Location: [place] [facility];  Service: Cardiovascular;  Laterality: N/A;   INTRAVASCULAR PRESSURE WIRE/FFR STUDY N/A 04/29/2021   Procedure: INTRAVASCULAR PRESSURE WIRE/FFR STUDY;  Surgeon: Burnell Blanks, MD;  Location: [place] [facility];  Service: Cardiovascular;  Laterality: N/A;   LEFT HEART CATH AND CORONARY ANGIOGRAPHY N/A 04/29/2021   Procedure: LEFT HEART CATH AND CORONARY ANGIOGRAPHY;  Surgeon: Burnell Blanks, MD;  Location: [place] [facility];  Service: Cardiovascular;  Laterality: N/A;   PROSTATE BIOPSY      Allergies  No Known Allergies  History of Present Illness   Jerry Moran is a 66 year old male with the above-mentioned past medical history who presents today for evaluation of syncope.  He has a history of CVA -2009 and 2020 with Left paramedian pontine infarct secondary to small vessel disease and 2020 Left para median pons. [name] presented to the ED via EMS on  04/28/2021 with complaint of substernal chest pain that was not reproducible.  Patient's ECG was unremarkable on arrival to ED and was given 324 mg ASA.  He was reported to be hypertensive with systolic blood pressures in the 140s to 170s and creatinine of 1.8.  High-sensitivity troponins trended at 321>>311 and patient was started on heparin drip.  Patient underwent LHC with DES to distal RCA and severe RCA stenosis, moderate LAD disease, and severe distal OM branch disease.  He was started on Brilinta and continue on ASA 81 mg for 1 year.  TTE was performed with EF of 60 to 65%, no RWMA, no LVH, and normal biatrial size.   Patient was last seen 05/2021 for follow-up after NSTEMI and reported no chest pain or shortness of breath.  He participated in cardiac rehab and unfortunately fell and broke two toes after falling down stairs. Patient's wife recently contacted the office due to a syncopal episode that occurred in [place] on 10/14/2021. He was treated with IV fluids and had recovery full recovery.  Patient was seen by me on 10/20/2021 for evaluation of syncopal episode.  Patient had a 14-day ZIO monitor and labs drawn consisting of CBC, BMET, TSH, and Mg that were all normal.  He presents today for evaluation review of ZIO monitor results.  Since last being seen in the office patient reports that he has been doing well with no syncopal or presyncopal episodes.  He is alone for today's appointment.  He is compliant with his medication regimen  and blood pressure is well controlled today at 134/70.  We reviewed the results of his ZIO monitor and patient had no additional questions at this time.  He is currently not participating in any physical activity program and we discussed reaching out to his PCP regarding initiating PT and OT for his mobility.  He states that he tried physical therapy after his stroke but stopped going 1 month into program.  He is currently motivated to resume at this time.  Patient  denies chest pain, palpitations, dyspnea, PND, orthopnea, nausea, vomiting, dizziness, syncope, edema, weight gain, or early satiety.   Home Medications    Current Outpatient Medications  Medication Sig Dispense Refill   amLODipine (NORVASC) 10 MG tablet Take 1 tablet (10 mg total) by mouth daily. 30 tablet 0   atorvastatin (LIPITOR) 80 MG tablet Take 1 tablet (80 mg total) by mouth at bedtime. 90 tablet 3   BAYER LOW DOSE 81 MG EC tablet Take 81 mg by mouth daily. Swallow whole.     blood glucose meter kit and supplies KIT Dispense based on patient and insurance preference. Use up to four times daily as directed. (FOR ICD-9 250.00, 250.01). 1 each 0   carvedilol (COREG) 25 MG tablet Take 1 tablet (25 mg total) by mouth 2 (two) times daily with a meal. 60 tablet 6   chlorthalidone (HYGROTON) 25 MG tablet Take 25 mg by mouth daily.     glipiZIDE (GLUCOTROL) 5 MG tablet Take 5 mg by mouth 2 (two) times daily before a meal.     lisinopril (ZESTRIL) 40 MG tablet Take 40 mg by mouth every morning.     metFORMIN (GLUCOPHAGE) 1000 MG tablet Take 1,000 mg by mouth 2 (two) times daily with a meal.     nitroGLYCERIN (NITROSTAT) 0.4 MG SL tablet Place 1 tablet (0.4 mg total) under the tongue every 5 (five) minutes as needed for chest pain. 25 tablet 3   tamsulosin (FLOMAX) 0.4 MG CAPS capsule Take 0.4 mg by mouth daily.     ticagrelor (BRILINTA) 90 MG TABS tablet Take 1 tablet (90 mg total) by mouth 2 (two) times daily. 60 tablet 6   No current facility-administered medications for this visit.     Review of Systems  Please see the history of present illness.    All other systems reviewed and are otherwise negative except as noted above.  Physical Exam    Wt Readings from Last 3 Encounters:  11/22/21 181 lb 9.6 oz (82.4 kg)  10/20/21 182 lb 12.8 oz (82.9 kg)  07/12/21 180 lb 5.4 oz (81.8 kg)   VS: Vitals:   11/22/21 0949  BP: 134/70  Pulse: 64  SpO2: 97%  ,Body mass index is 28.02  kg/m.  Constitutional:      Appearance: Healthy appearance. Not in distress.  Neck:     Vascular: JVD normal.  Pulmonary:     Effort: Pulmonary effort is normal.     Breath sounds: No wheezing. No rales. Diminished in the bases Cardiovascular:     Normal rate. Regular rhythm. Normal S1. Normal S2.      Murmurs: There is no murmur.  Edema:    Peripheral edema absent.  Abdominal:     Palpations: Abdomen is soft non tender. There is no hepatomegaly.  Skin:    General: Skin is warm and dry.  Neurological:     General: No focal deficit present.     Mental Status: Alert and oriented to person, place  and time.     Cranial Nerves: Cranial nerves are intact.  EKG/LABS/Other Studies Reviewed    ECG personally reviewed by me today -none completed today    Lab Results  Component Value Date   WBC 7.5 10/20/2021   HGB 9.0 (L) 10/20/2021   HCT 28.7 (L) 10/20/2021   MCV 91 10/20/2021   PLT 264 10/20/2021   Lab Results  Component Value Date   CREATININE 1.69 (H) 10/20/2021   BUN 20 10/20/2021   NA 139 10/20/2021   K 3.8 10/20/2021   CL 105 10/20/2021   CO2 20 10/20/2021   Lab Results  Component Value Date   ALT 21 05/09/2019   AST 20 05/09/2019   ALKPHOS 62 05/09/2019   BILITOT 1.1 05/09/2019   Lab Results  Component Value Date   CHOL 148 04/29/2021   HDL 40 (L) 04/29/2021   LDLCALC 91 04/29/2021   TRIG 83 04/29/2021   CHOLHDL 3.7 04/29/2021    Lab Results  Component Value Date   HGBA1C 6.0 (H) 04/29/2021    Assessment & Plan      1.  Syncope: -Currently resolved with ZIO monitor revealing no sustained arrhythmias or pauses -He reports today that he is not had any syncopal episodes and is feeling much better -Based on results of most recent event monitor patient is cleared to resume driving at his convenience   2.  History of CVA: -2009 and 01/2019 L paramedian pontine infarct secondary to small vessel disease and L para median pons. -Continue aspirin and  Lipitor 80 mg daily -Carotid ultrasounds completed with no changes to previous studies -Patient encouraged to reach out to PCP regarding occupational and physical therapy to assist with mobility   3.  Hyperlipidemia: -Last LDL was was 91 above goal of less than 70 Continue statin therapy as noted above   4.  Hypertension: -Patient's blood pressure today was controlled at 134/70 -Continue lisinopril 40 mg daily, carvedilol 25 mg twice daily and amlodipine 10 mg daily   5.  DM type II: -Patient's last hemoglobin A1c was 6.0 -Continue metformin 1000 mg twice daily and glipizide 5 mg daily -We will add Jardiance 10 mg daily for renal protection    6.  CKD stage IIIa: -Most recent creatinine was 1.69 on 10/2021 -Patient is currently followed by nephrology -Jardiance 10 mg added as noted above  7.  Coronary artery disease: - s/p NSTEMI 04/2021 which revealed multivessel CAD with moderate LAD, LCx, and pOM1 with evidence of severe stenosis in distal OM1/bifurcation which were medically managed.  He was found to have 99% distal RCA stenosis which was managed with PCI/DES. -Patient denies any chest pain or shortness of breath today -Continue ASA and Brilinta, atorvastatin 80 mg, carvedilol 25 mg twice daily   Disposition: Follow-up with Belva Crome III, MD or APP in 3 months    Medication Adjustments/Labs and Tests Ordered: Current medicines are reviewed at length with the patient today.  Concerns regarding medicines are outlined above.   Signed, Mable Fill, Marissa Nestle, NP 11/22/2021, 10:01 AM [place]

## 2021-11-18 ENCOUNTER — Telehealth: Payer: Self-pay | Admitting: Nurse Practitioner

## 2021-11-18 NOTE — Telephone Encounter (Signed)
Spoke with pt and spouse reviewed results.

## 2021-11-18 NOTE — Telephone Encounter (Signed)
Patient's wife returning call for monitor results.

## 2021-11-22 ENCOUNTER — Encounter: Payer: Self-pay | Admitting: Nurse Practitioner

## 2021-11-22 ENCOUNTER — Ambulatory Visit (INDEPENDENT_AMBULATORY_CARE_PROVIDER_SITE_OTHER): Payer: Medicare (Managed Care) | Admitting: Nurse Practitioner

## 2021-11-22 VITALS — BP 134/70 | HR 64 | Ht 67.5 in | Wt 181.6 lb

## 2021-11-22 DIAGNOSIS — R55 Syncope and collapse: Secondary | ICD-10-CM

## 2021-11-22 DIAGNOSIS — I2510 Atherosclerotic heart disease of native coronary artery without angina pectoris: Secondary | ICD-10-CM | POA: Diagnosis not present

## 2021-11-22 DIAGNOSIS — I10 Essential (primary) hypertension: Secondary | ICD-10-CM | POA: Diagnosis not present

## 2021-11-22 DIAGNOSIS — E119 Type 2 diabetes mellitus without complications: Secondary | ICD-10-CM

## 2021-11-22 DIAGNOSIS — N1831 Chronic kidney disease, stage 3a: Secondary | ICD-10-CM | POA: Diagnosis not present

## 2021-11-22 MED ORDER — EMPAGLIFLOZIN 10 MG PO TABS: 10.0000 mg | ORAL_TABLET | Freq: Every day | ORAL | 3 refills | Status: DC

## 2021-11-22 NOTE — Patient Instructions (Signed)
Medication Instructions:  Start Jardiance 10 mg daily   *If you need a refill on your cardiac medications before your next appointment, please call your pharmacy*   Lab Work: None ordered   If you have labs (blood work) drawn today and your tests are completely normal, you will receive your results only by: [place] (if you have MyChart) OR A paper copy in the mail If you have any lab test that is abnormal or we need to change your treatment, we will call you to review the results.   Testing/Procedures: None ordered    Follow-Up: Follow up as scheduled    Other Instructions   Important Information About Sugar

## 2021-11-28 ENCOUNTER — Telehealth: Payer: Self-pay

## 2021-11-28 NOTE — Telephone Encounter (Signed)
NOTES SCANNED TO REFERAL

## 2022-01-24 ENCOUNTER — Telehealth: Payer: Self-pay | Admitting: *Deleted

## 2022-01-24 NOTE — Telephone Encounter (Signed)
   Pre-operative Risk Assessment    Patient Name: Jerry Moran  DOB: 02/19/1956 MRN: [id number]      Request for Surgical Clearance    Procedure:   EGD / COLONOSCOPY   Date of Surgery:  Clearance 01/31/22                                 Surgeon:  DR. Audrea Muscat Surgeon's Group or Practice Name:  [facility] Phone number:  [phone] EX: [phone] Fax number:  [phone]   Type of Clearance Requested:   - Pharmacy:  Hold Ticagrelor (Brilinta) X'S 4 DAYS   Type of Anesthesia:   [place]    Additional requests/questions:    Astrid Divine   01/24/2022, 7:40 AM

## 2022-01-25 ENCOUNTER — Encounter (HOSPITAL_COMMUNITY): Payer: Self-pay | Admitting: Emergency Medicine

## 2022-01-25 ENCOUNTER — Other Ambulatory Visit: Payer: Self-pay

## 2022-01-25 ENCOUNTER — Observation Stay (HOSPITAL_COMMUNITY)
Admission: EM | Admit: 2022-01-25 | Discharge: 2022-01-26 | Disposition: A | Discharge status: Home Health | Payer: No Typology Code available for payment source | Attending: Student in an Organized Health Care Education/Training Program | Admitting: Student in an Organized Health Care Education/Training Program

## 2022-01-25 DIAGNOSIS — E119 Type 2 diabetes mellitus without complications: Secondary | ICD-10-CM

## 2022-01-25 DIAGNOSIS — K922 Gastrointestinal hemorrhage, unspecified: Secondary | ICD-10-CM | POA: Diagnosis present

## 2022-01-25 DIAGNOSIS — N1831 Chronic kidney disease, stage 3a: Secondary | ICD-10-CM | POA: Diagnosis not present

## 2022-01-25 DIAGNOSIS — Z7984 Long term (current) use of oral hypoglycemic drugs: Secondary | ICD-10-CM | POA: Insufficient documentation

## 2022-01-25 DIAGNOSIS — E1122 Type 2 diabetes mellitus with diabetic chronic kidney disease: Secondary | ICD-10-CM | POA: Diagnosis not present

## 2022-01-25 DIAGNOSIS — N179 Acute kidney failure, unspecified: Secondary | ICD-10-CM | POA: Insufficient documentation

## 2022-01-25 DIAGNOSIS — Z8546 Personal history of malignant neoplasm of prostate: Secondary | ICD-10-CM | POA: Insufficient documentation

## 2022-01-25 DIAGNOSIS — Z8673 Personal history of transient ischemic attack (TIA), and cerebral infarction without residual deficits: Secondary | ICD-10-CM | POA: Diagnosis not present

## 2022-01-25 DIAGNOSIS — Z79899 Other long term (current) drug therapy: Secondary | ICD-10-CM | POA: Insufficient documentation

## 2022-01-25 DIAGNOSIS — D509 Iron deficiency anemia, unspecified: Secondary | ICD-10-CM | POA: Diagnosis not present

## 2022-01-25 DIAGNOSIS — Z87891 Personal history of nicotine dependence: Secondary | ICD-10-CM | POA: Diagnosis not present

## 2022-01-25 DIAGNOSIS — I129 Hypertensive chronic kidney disease with stage 1 through stage 4 chronic kidney disease, or unspecified chronic kidney disease: Secondary | ICD-10-CM | POA: Diagnosis not present

## 2022-01-25 DIAGNOSIS — K625 Hemorrhage of anus and rectum: Secondary | ICD-10-CM

## 2022-01-25 DIAGNOSIS — I2510 Atherosclerotic heart disease of native coronary artery without angina pectoris: Secondary | ICD-10-CM | POA: Insufficient documentation

## 2022-01-25 DIAGNOSIS — D649 Anemia, unspecified: Secondary | ICD-10-CM

## 2022-01-25 DIAGNOSIS — Z955 Presence of coronary angioplasty implant and graft: Secondary | ICD-10-CM | POA: Diagnosis not present

## 2022-01-25 DIAGNOSIS — I10 Essential (primary) hypertension: Secondary | ICD-10-CM | POA: Diagnosis present

## 2022-01-25 LAB — COMPREHENSIVE METABOLIC PANEL
ALT: 16 U/L (ref 0–44)
AST: 19 U/L (ref 15–41)
Albumin: 3.2 g/dL — ABNORMAL LOW (ref 3.5–5.0)
Alkaline Phosphatase: 81 U/L (ref 38–126)
Anion gap: 10 (ref 5–15)
BUN: 28 mg/dL — ABNORMAL HIGH (ref 8–23)
CO2: 21 mmol/L — ABNORMAL LOW (ref 22–32)
Calcium: 8.9 mg/dL (ref 8.9–10.3)
Chloride: 111 mmol/L (ref 98–111)
Creatinine, Ser: 2.49 mg/dL — ABNORMAL HIGH (ref 0.61–1.24)
GFR, Estimated: 27 mL/min — ABNORMAL LOW (ref >60)
Glucose, Bld: 100 mg/dL — ABNORMAL HIGH (ref 70–99)
Potassium: 3.9 mmol/L (ref 3.5–5.1)
Sodium: 142 mmol/L (ref 135–145)
Total Bilirubin: 0.3 mg/dL (ref 0.3–1.2)
Total Protein: 7 g/dL (ref 6.5–8.1)

## 2022-01-25 LAB — CBC
HCT: 20.9 % — ABNORMAL LOW (ref 39.0–52.0)
Hemoglobin: 6.1 g/dL — CL (ref 13.0–17.0)
MCH: 23 pg — ABNORMAL LOW (ref 26.0–34.0)
MCHC: 29.2 g/dL — ABNORMAL LOW (ref 30.0–36.0)
MCV: 78.9 fL — ABNORMAL LOW (ref 80.0–100.0)
Platelets: 427 10*3/uL — ABNORMAL HIGH (ref 150–400)
RBC: 2.65 MIL/uL — ABNORMAL LOW (ref 4.22–5.81)
RDW: 17.6 % — ABNORMAL HIGH (ref 11.5–15.5)
WBC: 6 10*3/uL (ref 4.0–10.5)
nRBC: 0 % (ref 0.0–0.2)

## 2022-01-25 LAB — PREPARE RBC (CROSSMATCH)

## 2022-01-25 LAB — ABO/RH: ABO/RH(D): O POS

## 2022-01-25 MED ORDER — LACTATED RINGERS IV BOLUS
1000.0000 mL | Freq: Once | INTRAVENOUS | Status: AC
Start: 2022-01-25 — End: 2022-01-25
  Administered 2022-01-25: 1000 mL via INTRAVENOUS

## 2022-01-25 MED ORDER — AMLODIPINE BESYLATE 10 MG PO TABS
10.0000 mg | ORAL_TABLET | Freq: Every day | ORAL | Status: DC
  Administered 2022-01-26 (×2): 10 mg via ORAL
  Filled 2022-01-25 (×2): qty 1

## 2022-01-25 MED ORDER — PANTOPRAZOLE SODIUM 40 MG IV SOLR
40.0000 mg | Freq: Two times a day (BID) | INTRAVENOUS | Status: DC
  Administered 2022-01-25: 40 mg via INTRAVENOUS
  Filled 2022-01-25: qty 10

## 2022-01-25 MED ORDER — SODIUM CHLORIDE 0.9 % IV SOLN
10.0000 mL/h | Freq: Once | INTRAVENOUS | Status: AC
Start: 2022-01-25 — End: 2022-01-25
  Administered 2022-01-25: 10 mL/h via INTRAVENOUS

## 2022-01-25 MED ORDER — ATORVASTATIN CALCIUM 80 MG PO TABS
80.0000 mg | ORAL_TABLET | Freq: Every day | ORAL | Status: DC
  Administered 2022-01-26: 80 mg via ORAL
  Filled 2022-01-25: qty 1

## 2022-01-25 NOTE — ED Triage Notes (Signed)
Pt here from home with c/o low hgb , pt had blood drawn at [facility] on Saturday and was told to come to the ED today , has had some blood in his stool and is on a blood thinner

## 2022-01-25 NOTE — H&P (Signed)
Date: 01/25/2022               Patient Name:  Jerry Moran MRN: [id number]  DOB: May 07, 1955 Age / Sex: 66 y.o., male   PCP: [facility] [place] Service: Internal Medicine Teaching Service              Attending Physician: Dr. Evette Doffing, Mallie Mussel, *    First Contact: Corene Cornea, MS3 Pager: [phone]  Second Contact: Dr. Linward Natal Pager: [phone]  Third Contact Dr. Farrel Gordon Pager: [phone]       After Hours (After 5p/  First Contact Pager: [phone]  weekends / holidays): Second Contact Pager: [phone]   Chief Complaint: Low hemoglobin  History of Present Illness:  Jerry Moran is a 66 y.o. male with a past medical history of CAD s/p NSTEMI 04/2021 treated with DES-RCA and residual multivessel CAD, CVA in 2005 and 2020, DM2, prostate cancer, HTN, and HLD, who presents for evaluation of low hgb and bloody stools. He reports he took a stool test on Friday of last week, 10/20, which showed blood in his stool. He presented to the [place] for blood work on Saturday, 10/21, and was notified today that his hemoglobin was low and instructed to present to the ED. He endorses 2 weeks of blood in his stools; his stools are brown with red mixed in, and he has seen red blood in the toilet bowl. He has only noticed bleeding with bowel movements. Denies history of similar. He also endorses 2-3 weeks of fatigue and several weeks of lightheadedness. Three weeks ago at [employer], the patient felt lightheaded, slumped over and could not get up, and his wife called EMS. He is unsure if he lost consciousness. In the ED, he was told his vitals were stable and he was discharged. He had not eaten that day, so he assumed this episode was related to low blood sugar. The patient has also noted shortness of breath with activity, such as walking and even bending over to pick something up. He used to be able to take a daily walk down the block, and now he can only make it to the  mailbox before becoming short of breath and fatigued. His wife also notes bilateral foot swelling today. Denies fever, abdominal pain, nausea, vomiting, and diarrhea. No chest pain. He reports history of constipation, for which he takes prune juice which usually helps. He is mostly compliant with his medications, and reports they cause nausea which is his main deterrent.   ED course: Hgb 6.1. Vital signs normal. Ordered 2 units pRBCs. Protonix for presumed upper GIB. CMP notable for Cr 2.49, increased from baseline. IVF ordered.   Meds:  Current Meds  Medication Sig   amLODipine (NORVASC) 10 MG tablet Take 1 tablet (10 mg total) by mouth daily.   atorvastatin (LIPITOR) 80 MG tablet Take 1 tablet (80 mg total) by mouth at bedtime.   BAYER LOW DOSE 81 MG EC tablet Take 81 mg by mouth daily.   carvedilol (COREG) 25 MG tablet Take 1 tablet (25 mg total) by mouth 2 (two) times daily with a meal.   chlorthalidone (HYGROTON) 25 MG tablet Take 12.5 mg by mouth daily.   Cholecalciferol (VITAMIN D3) 125 MCG (5000 UT) CAPS Take 1 capsule by mouth daily.   empagliflozin (JARDIANCE) 10 MG TABS tablet Take 1 tablet (10 mg total) by mouth daily before breakfast.   ferrous  sulfate 325 (65 FE) MG tablet Take 325 mg by mouth See admin instructions. Take 1 capsule by mouth on Monday, Wednesday and Fridays   glipiZIDE (GLUCOTROL) 5 MG tablet Take 5 mg by mouth 2 (two) times daily before a meal.   lisinopril (ZESTRIL) 40 MG tablet Take 40 mg by mouth daily.   metFORMIN (GLUCOPHAGE) 1000 MG tablet Take 1,000 mg by mouth 2 (two) times daily with a meal.   nitroGLYCERIN (NITROSTAT) 0.4 MG SL tablet Place 1 tablet (0.4 mg total) under the tongue every 5 (five) minutes as needed for chest pain.   oxybutynin (DITROPAN) 5 MG tablet Take 2.5 mg by mouth 2 (two) times daily.   tamsulosin (FLOMAX) 0.4 MG CAPS capsule Take 0.4 mg by mouth daily.   ticagrelor (BRILINTA) 90 MG TABS tablet Take 1 tablet (90 mg total) by mouth 2  (two) times daily.     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   Physical Exam: Blood pressure (!) 152/72, pulse 78, temperature 97.6 F (36.4 C), temperature source Oral, resp. rate 18, height 5' 7.5" (1.715 m), weight 82 kg, SpO2 100 %.  General: Well-appearing; No acute distress. HEENT: Normocephalic, atraumatic; Moist mucus membranes. Cardiovascular: Regular rate and rhythm; No murmurs, rubs, or gallops; 2+ radial pulses; capillary refill <2 seconds.  Pulmonary: Lungs clear to auscultation bilaterally; No wheezes, rales, or rhonchi.  Abdomen: Soft, distended, tympanic; Non-tender to palpation; No masses or organomegaly. Extremities: Warm and well profused  Neuro: A&O x3. Follows commands. CN 2-12 grossly intact; No focal deficits; Strength 5/5 in all extremities. Finger to nose intact.   Assessment & Plan by Problem: Principal Problem:   GI bleed  Jerry Moran is a 66 y.o. male with a past medical history of CAD s/p NSTEMI 04/2021 treated with DES-RCA and residual multivessel CAD, DM2, prostate cancer, HTN, HLD, CVA, who presents for evaluation of decreased  hemoglobin and bloody stools, admitted for symptomatic anemia likely 2/2 GIB.   #Symptomatic, microcytic anemia #Rectal bleeding, positive FOBT Rectal bleeding for a few weeks in addition to lightheadedness, fatigue. The patient's hemoglobin on admission was 6.1. Receiving 2 u pRBCs. HDS. Possibility this is related to history of prostate cancer radiation therapy. GI consulted and plans to perform EGD and colonoscopy potentially on Friday, 10/27. Patient prefers to have EGD and colonoscopy done at [facility]. Will stabilize hgb and let him continue with care there if possible. - GI following, appreciate assistance - Post-transfusion H&H  - Hold aspirin and Brilinta anticoagulation in setting of likely GI  bleed  - Transfuse hgb <7  #AoCKD 3a vs. Progression of CKD Creatinine today is 2.49, increased from baseline (1.69 three months ago). This could be progression of his chronic kidney disease vs acute injury.  He is followed by nephrology outpatient. -received LR 1 L bolus -trend BMP -avoid nephrotoxic agents  #DM2 The patient is euglycemic today. Most recent A1c 6.0 per chart review. Home regimen includes  Empagliflozin 10 mg, Glipizide 5 mg with meals BID, and Metformin 1000 mg BID.  -monitor glucose for now, can add sliding scale if needed  #HTN The patient's blood pressure is elevated to 152/72 in the ED. Endorses irregular compliance with medications. Will continue amlodipine but hold home lisinopril and chlorthalidone in the setting of possible AKI. - Amlodipine 10 mg daily  - Hold Carvedilol 25 mg BID - Hold Lisinopril 40 mg daily - Hold Chlorthalidone 25 mg daily  #Hx CVA The patient reports some residual leg weakness from his last CVA but no focal deficits on exam today. His wife supports him with his IADLs.  -hold aspirin and ticagrelor in setting of GI bleed -restart home statin  #Hx prostate cancer Treated with radiation in 2022.   #Hx of MI Benign CV exam today. The patient  denies chest pain. - Restart Amlodipine 10 mg daily - Restart Atorvastatin 80 mg daily - Hold Aspirin 81 mg daily - Hold Carvedilol 25 mg BID    Dispo: Admit patient to Observation with expected length of stay less than 2 midnights.  Attestation for Student Documentation:  I personally was present and performed or re-performed the history, physical exam and medical decision-making activities of this service and have verified that the service and findings are accurately documented in the student's note.  Linward Natal, MD 01/25/2022, 6:51 PM   Signed: Linward Natal, MD 01/25/2022, 6:51 PM  Pager: '@[phone]'$ @

## 2022-01-25 NOTE — ED Provider Notes (Signed)
[facility] EMERGENCY DEPARTMENT Provider Note   CSN: [id number] Arrival date & time: 01/25/22  1129     History  Chief Complaint  Patient presents with   GI Bleeding    Jerry Moran is a 66 y.o. male.  HPI Patient presents for low hemoglobin.  Medical history includes TIA, CVA, T2DM, HTN, CAD, CKD.  He receives his outpatient care through the [place].  He did mention to his primary care doctor that he was having dark stools over the past several weeks.  He underwent lab work last week which showed a drop in his hemoglobin.  He also underwent Hemoccult testing which was positive.  He was advised to come to the ED.  Patient currently takes aspirin and Brilinta.  He does not take any anticoagulation.  He denies any history of GI bleeding.  After the onset of his dark stools, he has been feeling fatigue.  He had a near syncopal episode several days ago.  Patient denies any areas of pain.    Home Medications Prior to Admission medications   Medication Sig Start Date End Date Taking? Authorizing Provider  amLODipine (NORVASC) 10 MG tablet Take 1 tablet (10 mg total) by mouth daily. 01/16/19   Kayleen Memos, DO  atorvastatin (LIPITOR) 80 MG tablet Take 1 tablet (80 mg total) by mouth at bedtime. 04/30/21   [name], Lorelee Cover., [name]  BAYER LOW DOSE 81 MG EC tablet Take 81 mg by mouth daily. Swallow whole.    [provider]  blood glucose meter kit and supplies KIT Dispense based on patient and insurance preference. Use up to four times daily as directed. (FOR ICD-9 250.00, 250.01). 01/16/19   Kayleen Memos, DO  carvedilol (COREG) 25 MG tablet Take 1 tablet (25 mg total) by mouth 2 (two) times daily with a meal. 04/30/21   [name], Lorelee Cover., [name]  chlorthalidone (HYGROTON) 25 MG tablet Take 25 mg by mouth daily. 05/04/21   [provider]  empagliflozin (JARDIANCE) 10 MG TABS tablet Take 1 tablet (10 mg total) by mouth daily before breakfast. 11/22/21   Marylu Lund., NP  glipiZIDE (GLUCOTROL) 5 MG tablet Take 5 mg by mouth 2 (two) times daily before a meal.    [provider]  lisinopril (ZESTRIL) 40 MG tablet Take 40 mg by mouth every morning. 08/10/20   [provider]  metFORMIN (GLUCOPHAGE) 1000 MG tablet Take 1,000 mg by mouth 2 (two) times daily with a meal.    [provider]  nitroGLYCERIN (NITROSTAT) 0.4 MG SL tablet Place 1 tablet (0.4 mg total) under the tongue every 5 (five) minutes as needed for chest pain. 04/30/21   [name], Lorelee Cover., [name]  tamsulosin (FLOMAX) 0.4 MG CAPS capsule Take 0.4 mg by mouth daily.    [provider]  ticagrelor (BRILINTA) 90 MG TABS tablet Take 1 tablet (90 mg total) by mouth 2 (two) times daily. 04/30/21   [name], Lorelee Cover., [name]      Allergies    Patient has no known allergies.    Review of Systems   Review of Systems  Constitutional:  Positive for fatigue.  Gastrointestinal:  Positive for blood in stool.  Neurological:  Positive for weakness (Generalized).  All other systems reviewed and are negative.   Physical Exam Updated Vital Signs BP (!) 157/78   Pulse 77   Temp 97.6 F (36.4 C) (Oral)   Resp 16   Ht 5' 7.5" (1.715 m)  Wt 82 kg   SpO2 100%   BMI 27.90 kg/m  Physical Exam Vitals and nursing note reviewed.  Constitutional:      General: He is not in acute distress.    Appearance: Normal appearance. He is well-developed and normal weight. He is not ill-appearing, toxic-appearing or diaphoretic.  HENT:     Head: Normocephalic and atraumatic.     Right Ear: External ear normal.     Left Ear: External ear normal.     Nose: Nose normal.     Mouth/Throat:     Mouth: Mucous membranes are moist.     Pharynx: Oropharynx is clear.  Eyes:     Extraocular Movements: Extraocular movements intact.     Conjunctiva/sclera: Conjunctivae normal.  Cardiovascular:     Rate and Rhythm: Normal rate and regular rhythm.  Pulmonary:     Effort: Pulmonary  effort is normal. No respiratory distress.  Abdominal:     General: There is no distension.     Palpations: Abdomen is soft.     Tenderness: There is no abdominal tenderness.  Musculoskeletal:        General: No swelling. Normal range of motion.     Cervical back: Normal range of motion and neck supple.     Right lower leg: Edema present.     Left lower leg: Edema present.  Skin:    General: Skin is warm and dry.     Capillary Refill: Capillary refill takes less than 2 seconds.     Coloration: Skin is not jaundiced or pale.  Neurological:     General: No focal deficit present.     Mental Status: He is alert and oriented to person, place, and time.     Cranial Nerves: No cranial nerve deficit.     Sensory: No sensory deficit.     Motor: No weakness.     Coordination: Coordination normal.  Psychiatric:        Mood and Affect: Mood normal.        Behavior: Behavior normal.     ED Results / Procedures / Treatments   Labs (all labs ordered are listed, but only abnormal results are displayed) Labs Reviewed  COMPREHENSIVE METABOLIC PANEL - Abnormal; Notable for the following components:      Result Value   CO2 21 (*)    Glucose, Bld 100 (*)    BUN 28 (*)    Creatinine, Ser 2.49 (*)    Albumin 3.2 (*)    GFR, Estimated 27 (*)    All other components within normal limits  CBC - Abnormal; Notable for the following components:   RBC 2.65 (*)    Hemoglobin 6.1 (*)    HCT 20.9 (*)    MCV 78.9 (*)    MCH 23.0 (*)    MCHC 29.2 (*)    RDW 17.6 (*)    Platelets 427 (*)    All other components within normal limits  FERRITIN  BASIC METABOLIC PANEL  CBC  POC OCCULT BLOOD, ED  TYPE AND SCREEN  ABO/RH  PREPARE RBC (CROSSMATCH)    EKG None  Radiology No results found.  Procedures Procedures    Medications Ordered in ED Medications  0.9 %  sodium chloride infusion (10 mL/hr Intravenous New Bag/Given 01/25/22 1620)  lactated ringers bolus 1,000 mL (0 mLs Intravenous  Stopped 01/25/22 1557)    ED Course/ Medical Decision Making/ A&P  Medical Decision Making Amount and/or Complexity of Data Reviewed Labs: ordered.  Risk Prescription drug management. Decision regarding hospitalization.   This patient presents to the ED for concern of anemia, this involves an extensive number of treatment options, and is a complaint that carries with it a high risk of complications and morbidity.  The differential diagnosis includes upper GI bleed, symptomatic anemia, bleeding diathesis   Co morbidities that complicate the patient evaluation  TIA, CVA, T2DM, HTN, CAD, CKD   Additional history obtained:  Additional history obtained from patient's wife External records from outside source obtained and reviewed including EMR   Lab Tests:  I Ordered, and personally interpreted labs.  The pertinent results include: Acute on chronic anemia, AKI, normal electrolytes, no leukocytosis   Cardiac Monitoring: / EKG:  The patient was maintained on a cardiac monitor.  I personally viewed and interpreted the cardiac monitored which showed an underlying rhythm of: Sinus rhythm   Consultations Obtained:  I requested consultation with the gastroenterology,  and discussed lab and imaging findings as well as pertinent plan - they recommend: Will evaluate the patient in ED and recommendations to follow   Problem List / ED Course / Critical interventions / Medication management  Patient is a pleasant 66 year old male presenting for symptomatic anemia.  He has had dark stools for the past 3 weeks.  Presence of blood was confirmed with Hemoccult testing at the [place].  His baseline hemoglobin appears to be in the range of 9-10.  His hemoglobin today is 6.1.  His recent symptoms have included fatigue and generalized weakness.  Despite this, he is well-appearing on exam.  He is accompanied by his wife who assists in history.  Currently, patient's vital signs  are normal.  Given his recent work-up at the [facility], rectal exam was deferred.  Patient was consented for blood transfusion.  2 units of PRBCs was ordered.  Protonix was ordered for empiric treatment of upper GI bleed.  Patient's lab work is also notable for an AKI.  IV fluids were ordered.  I spoke to gastroenterology who will evaluate him in the ED and provide further recommendations.  Patient was admitted to hospitalist for further management. I ordered medication including Protonix for upper GI bleed; PRBCs for symptomatic anemia; IV fluids for AKI Reevaluation of the patient after these medicines showed that the patient improved I have reviewed the patients home medicines and have made adjustments as needed   Social Determinants of Health:  Has access to outpatient care through the [facility] Performed by: Godfrey Pick   Total critical care time: 34 minutes  Critical care time was exclusive of separately billable procedures and treating other patients.  Critical care was necessary to treat or prevent imminent or life-threatening deterioration.  Critical care was time spent personally by me on the following activities: development of treatment plan with patient and/or surrogate as well as nursing, discussions with consultants, evaluation of patient's response to treatment, examination of patient, obtaining history from patient or surrogate, ordering and performing treatments and interventions, ordering and review of laboratory studies, ordering and review of radiographic studies, pulse oximetry and re-evaluation of patient's condition.           Final Clinical Impression(s) / ED Diagnoses Final diagnoses:  Symptomatic anemia  Upper GI bleed    Rx / DC Orders ED Discharge Orders     None         Godfrey Pick, MD 01/25/22 1703

## 2022-01-25 NOTE — ED Provider Triage Note (Cosign Needed Addendum)
Emergency Medicine Provider Triage Evaluation Note  Jerry Moran , a 66 y.o. male  was evaluated in triage.  Pt complains of abnormal lab values and blood in his stool.  Had labs drawn at the [place] on Saturday, called today and recommended come to the ED due to low hemoglobin.  Also notified that stool tested positive for blood.  Patient states the stool has been black and tarry with bits of bright red blood over the last 3 to 4 weeks.  Also had a syncopal episode at a grocery store 3 weeks ago.  Denies fevers, chills, N/V/D, abdominal pain, back pain.  On brilinta and ASA  Review of Systems  Positive:  Negative: See above  Physical Exam  BP 123/71 (BP Location: Right Arm)   Pulse 68   Temp 97.8 F (36.6 C)   Resp 15   SpO2 100%  Gen:   Awake, no distress   Resp:  Normal effort, CTAB, equal chest rise MSK:   Moves extremities without difficulty  Other:  Abdomen soft, nontender.  Pulses strong and equal of all extremities  Medical Decision Making  Medically screening exam initiated at 12:55 PM.  Appropriate orders placed.  Draven Ferron was informed that the remainder of the evaluation will be completed by another provider, this initial triage assessment does not replace that evaluation, and the importance of remaining in the ED until their evaluation is complete.     Prince Rome, [name] 70/92/95 [id number]    Prince Rome, [name] 40/37/09 1304

## 2022-01-25 NOTE — Consult Note (Addendum)
[facility] Consult: 2:32 PM 01/25/2022  LOS: 0 days    Referring Provider: Godfrey Pick MD in ED.    Primary Care Physician:  Cl[facility], [facility] Primary Gastroenterologist:  Dr Audrea Muscat gen surg in [place]    Reason for Consultation: Microcytic anemia.  FOBT positive.  Rectal bleeding a couple of weeks ago that resolved   HPI: Jerry Moran is a 66 y.o. male.  PMH NSTEMI, CAD treated w DES to RCA.  Has residual multivessel disease.  Developmental vascular abnormalities.  CVA 2020.  Prostate cancer treated with radiation March through May 2022.  Hypertension.  DM 2 Screening colonoscopy at the [place] about 6 years ago was unremarkable.  Experiencing fatigue for about [redacted] weeks along with dyspnea on exertion that has gotten to the point where just walking around the house makes him short of breath.  A couple of days of lower extremity edema.  2 to 3 weeks ago, over the period of a few days, he had 2 or 3 episodes of stool associated rectal bleeding, sounds like moderate volume at most.  It resolved.  No accompanying upper GI symptoms, abdominal pain, altered bowel habits.  Weight is stable.  No dysphagia.  Denies unusual or excessive bleeding or bruising.  No previous transfusions. Lab work revealed anemia and he was started on oral iron.  FOBT tests performed and were positive.  He was set up for colonoscopy and EGD at the [place] this coming Tuesday.  Patient has not seen recurrence of rectal bleeding since the episodes a few weeks ago. Repeat lab work on Saturday showed progressive anemia and he was advised by phone today to go to the local ED where he is now evaluated. He notes he has had some new onset of pedal/lower extremity edema in the past couple of days.  Early this year he had swelling but had had long  been resolved.    Today Hgb 6.1.  was 9 in mid July 2023.  MCV 78, was 91 in July.  Platelets normal. GFR 27, was 52 in January.  BUN/creatinine 28/2.4.   No INR results currently. No hypotension, tachycardia, fever.  Room air sats 100%.  Family history negative for ulcer disease, colorectal cancers, colitis, gastrointestinal disease. Lives with his wife of nearly 74 years.  No EtOH.  Former cigarette smoker.  Past Medical History:  Diagnosis Date   Coronary artery disease    Diabetes mellitus without complication ([place])    Hyperlipidemia    Hypertension    Prostate cancer ([place])    Stroke [facility])     Past Surgical History:  Procedure Laterality Date   CARDIAC CATHETERIZATION     CORONARY STENT INTERVENTION N/A 04/29/2021   Procedure: CORONARY STENT INTERVENTION;  Surgeon: Burnell Blanks, MD;  Location: [place] [facility];  Service: Cardiovascular;  Laterality: N/A;   INTRAVASCULAR PRESSURE WIRE/FFR STUDY N/A 04/29/2021   Procedure: INTRAVASCULAR PRESSURE WIRE/FFR STUDY;  Surgeon: Burnell Blanks, MD;  Location: [place] [facility];  Service: Cardiovascular;  Laterality: N/A;   LEFT HEART CATH  AND CORONARY ANGIOGRAPHY N/A 04/29/2021   Procedure: LEFT HEART CATH AND CORONARY ANGIOGRAPHY;  Surgeon: Burnell Blanks, MD;  Location: [place] [facility];  Service: Cardiovascular;  Laterality: N/A;   PROSTATE BIOPSY      Prior to Admission medications   Medication Sig Start Date End Date Taking? Authorizing Provider  amLODipine (NORVASC) 10 MG tablet Take 1 tablet (10 mg total) by mouth daily. 01/16/19   Kayleen Memos, DO  atorvastatin (LIPITOR) 80 MG tablet Take 1 tablet (80 mg total) by mouth at bedtime. 04/30/21   [name], Lorelee Cover., [name]  BAYER LOW DOSE 81 MG EC tablet Take 81 mg by mouth daily. Swallow whole.    [provider]  blood glucose meter kit and supplies KIT Dispense based on patient and insurance preference. Use up to four times daily as  directed. (FOR ICD-9 250.00, 250.01). 01/16/19   Kayleen Memos, DO  carvedilol (COREG) 25 MG tablet Take 1 tablet (25 mg total) by mouth 2 (two) times daily with a meal. 04/30/21   [name], Lorelee Cover., [name]  chlorthalidone (HYGROTON) 25 MG tablet Take 25 mg by mouth daily. 05/04/21   [provider]  empagliflozin (JARDIANCE) 10 MG TABS tablet Take 1 tablet (10 mg total) by mouth daily before breakfast. 11/22/21   Marylu Lund., NP  glipiZIDE (GLUCOTROL) 5 MG tablet Take 5 mg by mouth 2 (two) times daily before a meal.    [provider]  lisinopril (ZESTRIL) 40 MG tablet Take 40 mg by mouth every morning. 08/10/20   [provider]  metFORMIN (GLUCOPHAGE) 1000 MG tablet Take 1,000 mg by mouth 2 (two) times daily with a meal.    [provider]  nitroGLYCERIN (NITROSTAT) 0.4 MG SL tablet Place 1 tablet (0.4 mg total) under the tongue every 5 (five) minutes as needed for chest pain. 04/30/21   [name], Lorelee Cover., [name]  tamsulosin (FLOMAX) 0.4 MG CAPS capsule Take 0.4 mg by mouth daily.    [provider]  ticagrelor (BRILINTA) 90 MG TABS tablet Take 1 tablet (90 mg total) by mouth 2 (two) times daily. 04/30/21   [name], Lorelee Cover., [name]    Scheduled Meds:  pantoprazole (PROTONIX) IV  40 mg Intravenous Q12H   Infusions:  sodium chloride     PRN Meds:    Allergies as of 01/25/2022   (No Known Allergies)    Family History  Problem Relation Age of Onset   Breast cancer Mother    Colon cancer Neg Hx    Pancreatic cancer Neg Hx    Prostate cancer Neg Hx     Social History   Socioeconomic History   Marital status: Married    Spouse name: Almyra Free   Number of children: Not on file   Years of education: 13   Highest education level: GED or equivalent  Occupational History   Occupation: Retired  Tobacco Use   Smoking status: Former    Types: Cigarettes   Smokeless tobacco: Never   Tobacco comments:    Smoked cigarettes as a teenager   Scientific laboratory technician Use: Never used  Substance and Sexual Activity   Alcohol use: Not Currently   Drug use: Never   Sexual activity: Not Currently  Other Topics Concern   Not on file  Social History Narrative   Uses a cane   Social Determinants of Health   Financial Resource Strain: Not on file  Food Insecurity: Not on file  Transportation Needs: Not on file  Physical Activity: Not on file  Stress: Not on file  Social Connections: Not on file  Intimate Partner Violence: Not on file    REVIEW OF SYSTEMS: Constitutional: Fatigue and weakness for the past few weeks. ENT:  No nose bleeds Pulm: Dyspnea on exertion, progressive over the last few weeks. CV:  No palpitations, no angina.  Lower extremity edema for the past couple of days. GU:  No hematuria, no frequency.  No oliguria. GI: See HPI. Heme: See HPI Transfusions: See HPI. Neuro:  No headaches, no peripheral tingling or numbness.  No syncope, no seizures.  No significant dizziness Derm:  No itching, no rash or sores.  Endocrine:  No sweats or chills.  No polyuria or dysuria Immunization: Reviewed.  He is up-to-date on multiple vaccines including COVID-19. Travel: Not queried.   PHYSICAL EXAM: Vital signs in last 24 hours: Vitals:   01/25/22 1234 01/25/22 1404  BP: 123/71 133/72  Pulse: 68 71  Resp: 15 17  Temp: 97.8 F (36.6 C)   SpO2: 100% 100%   Wt Readings from Last 3 Encounters:  01/25/22 82 kg  11/22/21 82.4 kg  10/20/21 82.9 kg    General: Patient looks well.  Is in no distress.  Alert.  Comfortable. Head: No facial asymmetry or swelling.  No signs of head trauma. Eyes: Conjunctiva pale.  No scleral icterus.  EOMI. Ears: No hearing deficit Nose: No congestion or discharge Mouth: Fair dentition, a few missing teeth.  Tongue is midline.  Mucosa moist, pink, clear. Neck: No JVD, no thyromegaly, no masses Lungs: Clear bilaterally though breath sounds diminished globally.  No dyspnea at rest.  No  cough. Heart: RRR.  No MRG.  S1, S2 present. Abdomen: Soft without tenderness.  Active bowel sounds.  No HSM, masses, bruits, hernias..   Rectal: Deferred Musc/Skeltl: No joint redness, swelling or gross deformity. Extremities: 2+ pedal/ankle edema. Neurologic: Alert.  Oriented x3.  Moves all 4 limbs, no tremors.  Strength not tested.  No obvious deficits.  Speech is clear without aphasia. Skin: No rash, no sores, no suspicious lesions. Nodes: No cervical adenopathy Psych: Calm, cooperative, pleasant.   LAB RESULTS: Recent Labs    01/25/22 1251  WBC 6.0  HGB 6.1*  HCT 20.9*  PLT 427*   BMET Lab Results  Component Value Date   NA 142 01/25/2022   NA 139 10/20/2021   NA 139 05/26/2021   K 3.9 01/25/2022   K 3.8 10/20/2021   K 3.8 05/26/2021   CL 111 01/25/2022   CL 105 10/20/2021   CL 105 05/26/2021   CO2 21 (L) 01/25/2022   CO2 20 10/20/2021   CO2 21 05/26/2021   GLUCOSE 100 (H) 01/25/2022   GLUCOSE 165 (H) 10/20/2021   GLUCOSE 89 05/26/2021   BUN 28 (H) 01/25/2022   BUN 20 10/20/2021   BUN 19 05/26/2021   CREATININE 2.49 (H) 01/25/2022   CREATININE 1.69 (H) 10/20/2021   CREATININE 1.58 (H) 05/26/2021   CALCIUM 8.9 01/25/2022   CALCIUM 9.0 10/20/2021   CALCIUM 9.5 05/26/2021   LFT Recent Labs    01/25/22 1251  PROT 7.0  ALBUMIN 3.2*  AST 19  ALT 16  ALKPHOS 81  BILITOT 0.3     IMPRESSION:   Microcytic anemia.  Started on oral iron a couple of weeks ago.  Now referred to ED for progressive anemia.  Rectal bleeding, 2 or 3 episodes 2 or 3 weeks ago, and nothing  since.  FOBT positive on subsequent testing.  Set up for colonoscopy and EGD early next week at the [place].    CAD.  NSTEMI 04/2021. S/p coronary stenting.   On DAPT: Brilinta/ASA rec for 1 year.  Last Brilinta was yesterday evening 10/25.  AKI.  Current GFR consistent with stage IV CKD.  In January of this year it was stage 3a.      Hx Prostate cancer, treated w radiation in early 2022.      PLAN:       Transfuse 2 PRBCs as ordered.  See MD notes    Azucena Freed  01/25/2022, 2:32 PM Phone [phone]       [facility] Attending   I have taken an interval history, reviewed the chart and examined the patient. I agree with the Advanced Practitioner's note, impression and recommendations.  Majority the medical decision-making in the formulation of the assessment and plan were performed by me.   Some rectal bleeding recently - stopped. One possibility is XRT damage to rectum - radiation associated vascular ectasia. Certainly possible could be CRCA or other neoplasia.  Hgb progressively worse in past year. Was 9 about  3 mos ago. MCV decreasing. Has been on DAAPT w/o PPI.   He needs a transfusion which was ordered.  Also needs a colonoscopy, possible EGD. That is scheduled for Tuesday next week. We reviewed options of proceeding with endoscopic evaluation here vs waiting. He prefers to get blood and dc and go to [place] next week. I think, unless something changes, that is reasonable.   Gatha Mayer, MD, [facility] See Shea Evans on call - gastroenterology for best contact person 01/25/2022 3:50 PM

## 2022-01-25 NOTE — ED Notes (Signed)
Consent signed for blood transfusion

## 2022-01-25 NOTE — Telephone Encounter (Signed)
   Patient Name: Jerry Moran  DOB: 12-10-1955 MRN: [id number]  Primary Cardiologist: Sinclair Grooms, MD  Chart reviewed as part of pre-operative protocol coverage. Pharmacy clearance only received.  Angelina Antonini has a hx of CAD s/p NSTEMI 04/2021 treated with DES-RCA and residual multivessel CAD, DVA 2009 and 2020, DM2, prostate cancer, HTN.   At time of NSTEMI and LHC 04/29/21 was recommended for DAPT Brilinta/Aspirin for one year.   Per office protocols, as she is >6 months from coronary stent may hold Brilinta for 5 days prior to procedure.   If procedure is purely elective, would recommend waiting until after she has completed 1 year of DAPT 04/30/22.   I will route this recommendation to the requesting party via Epic fax function and remove from pre-op pool.  Please call with questions.  Loel Dubonnet, NP 01/25/2022, 12:30 PM

## 2022-01-25 NOTE — Hospital Course (Addendum)
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 Deficit R sided weakness, bilateral LE weakness, step gait), diabetes, htn, mi 04/2021 with stent, prostate cancer s/p radiation 2022, hld, ckd(?)  Has been having bloody stools. Knows that he needs a blood transfusion. Had labs done at [facility] on Saturday and got a call today with results to come to ed. Says stools are normal now. Stools were brown with red mixed in, and has had red in the toilet bowl. Called Friday about stool changes then had labs Saturday. Has been going on around 2 weeks. Only when he poops. Has not been feeling well after last several weeks--very fatigued. He did have to have ems called ehile her was at [employer] about 3 weeks ago because he was presyncopal,looked "slumped" per wife. He said he had LOC at that episode but he didn't? He felt unwell and put his head down, ems checked bp hr bg and everything ewas fine so he didn't go to hospital--he hadn't eaten that day so they thought it wa due to that. Has been having lightheadedness for a while. Not for months but def weeks. Has been less active due to fatigue, even with walking to the bathroom or leaning over to puick something up. No diarrhea abdominal pain. Some leg/feet swelling but has just been today. Has bad appetite but that's since stroke. Is depressed? This is first time hes ever had this. Does get constipated sometimes.  Admitted 01/25/2022  Allergies: Patient has no known allergies. Pertinent Hx: CVA in 2005 and 2020, T2DM, HTN, MI in 04/2021 s/p DES, prostate cancer s/p radiation in 2022, HLD, CKD 3a  66 y.o. male p/w GI bleed  * Lower GI bleed: Hgb 6.1 on presentation. Getting 2 units PRBC, will check post-transfusion H&H. [facility] and would like to keep that scheduled procedure. Plan is to replete with PRBC transfusions and discharge.  Consults: GI  Meds: amlodipine, atorvastatin VTE ppx: SCDs IVF: None Diet: Soft    No other personal history of  prostate cx. Is UTD on colonoscopy, last was 6y ago, don't think there were abnormalities. M had cancer, unknwon kind, she smoked D had ?? 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  Meds: mostly compliant. Has some nausea with meds which is a deterrant. Tamsulosin .4 Fe slfate 325 mwf Empagliflozin 10 Carvedilol 25 BID Vit d3 125 mcg daily Chlorthalidone 12.5 daily Oxybutinin 2.5 bid\ Nitro .4 prn Brilinta 90 bid Am;po 10 daily Gl;ipizide 4 bidatorva 80 Metformin 1000 bid Lisno 40  Asa 27  Heart surgery  Lives at home with his wife. They have a 5 yo daughter who they adopted. Has a cat.  Independent in adls, not iadls wife helps with that. Has some memory issues. Tobacco no. Used to age [age]. No. Used to, see above Other substances no. Used to, see above  _____ #Symptoatic anemia  - EGD and colonoscopy scheduled at [facility] on Tuesday, 10/31 - Please check CBC to evaluate for further anemia  - Please have the patient stop Brilinta anticoagulation until bleeding is controlled   #AoCKD 3a vs. Progression of CKD - Please recheck creatinine  ______  #Symptomatic anemia  The patient was sent to the ED from the [facility] hospital due to his low hemoglobin on Wednesday, 10/25. On presentation, his hemoglobin was 6.1, and he endorsed several weeks of blood in his stools, fatigue, lightheadedness, and dyspnea on  exertion. He was given 2 units of pRBCs and started on Protonix for suspected upper GI bleed. GI was consulted, and planned to perform a EGD and colonoscopy, but the patient chose to follow-up with his previously scheduled EGD and colonoscopy on Tuesday, 10/31 at the [facility] hospital.. After transfusion, an H&H showed his hemoglobin increased to 9.0, and he denied any further fatigue or dyspnea. An iron/anemia profile was collected and showed a decreased ferritin of 5. His iron deficit was calculated to be 580 mg and  the patient was started on IV Venofer 400 mg. His home medications include Fe sulfate 325 mg MWF, so he will continue this following discharge.   #AoCKD 3a vs. Progression of CKD The patient's CMP on admission showed a creatinine of 2.49, which was elevated from 1.69 three months prior. His creatinine had not exceeded 2.0 in the past few years, which was concerning for progression of his CKD vs. AKI.  The patient received LR 1 L bolus and his repeat CMP showed a decreased creatinine of 2.30. We concluded this is likely an AKI from chronic blood loss, and should correct following controlled bleeding and corrected anemia. He will follow-up outpatient.

## 2022-01-25 NOTE — ED Notes (Signed)
GI at bedside

## 2022-01-26 DIAGNOSIS — K625 Hemorrhage of anus and rectum: Secondary | ICD-10-CM | POA: Diagnosis not present

## 2022-01-26 DIAGNOSIS — D509 Iron deficiency anemia, unspecified: Secondary | ICD-10-CM | POA: Diagnosis not present

## 2022-01-26 LAB — BPAM RBC
Blood Product Expiration Date: 202311232359
Blood Product Expiration Date: 202311232359
ISSUE DATE / TIME: 202310251603
ISSUE DATE / TIME: 202310252048
Unit Type and Rh: 5100
Unit Type and Rh: 5100

## 2022-01-26 LAB — TYPE AND SCREEN
ABO/RH(D): O POS
Antibody Screen: NEGATIVE
Unit division: 0
Unit division: 0

## 2022-01-26 LAB — FERRITIN: Ferritin: 5 ng/mL — ABNORMAL LOW (ref 24–336)

## 2022-01-26 LAB — BASIC METABOLIC PANEL
Anion gap: 9 (ref 5–15)
BUN: 27 mg/dL — ABNORMAL HIGH (ref 8–23)
CO2: 20 mmol/L — ABNORMAL LOW (ref 22–32)
Calcium: 9 mg/dL (ref 8.9–10.3)
Chloride: 110 mmol/L (ref 98–111)
Creatinine, Ser: 2.3 mg/dL — ABNORMAL HIGH (ref 0.61–1.24)
GFR, Estimated: 30 mL/min — ABNORMAL LOW (ref >60)
Glucose, Bld: 130 mg/dL — ABNORMAL HIGH (ref 70–99)
Potassium: 4 mmol/L (ref 3.5–5.1)
Sodium: 139 mmol/L (ref 135–145)

## 2022-01-26 LAB — HEMOGLOBIN AND HEMATOCRIT, BLOOD
HCT: 27.7 % — ABNORMAL LOW (ref 39.0–52.0)
Hemoglobin: 9 g/dL — ABNORMAL LOW (ref 13.0–17.0)

## 2022-01-26 LAB — GLUCOSE, CAPILLARY: Glucose-Capillary: 87 mg/dL (ref 70–99)

## 2022-01-26 SURGERY — COLONOSCOPY WITH PROPOFOL
Anesthesia: Monitor Anesthesia Care

## 2022-01-26 MED ORDER — GLIPIZIDE 5 MG PO TABS: 5.0000 mg | ORAL_TABLET | Freq: Every day | ORAL | 2 refills | Status: AC

## 2022-01-26 MED ORDER — SODIUM CHLORIDE 0.9 % IV SOLN
400.0000 mg | Freq: Once | INTRAVENOUS | Status: DC
  Filled 2022-01-26: qty 20

## 2022-01-26 MED ORDER — SODIUM CHLORIDE 0.9 % IV SOLN: INTRAVENOUS | Status: DC

## 2022-01-26 MED ORDER — SODIUM CHLORIDE 0.9 % IV SOLN
400.0000 mg | INTRAVENOUS | Status: DC
  Filled 2022-01-26: qty 20

## 2022-01-26 NOTE — Evaluation (Signed)
Physical Therapy Evaluation Patient Details Name: Jerry Moran MRN: [id number] DOB: [birth year] Today's Date: 01/26/2022  History of Present Illness  Jerry Moran is a 66 y.o. male admitted 10/25 who presents for evaluation of low hgb and bloody stools. He reports he took a stool test on Friday of last week, 10/20, which showed blood in his stool. He presented to the [place] for blood work on Saturday, 10/21, and was notified that his hemoglobin was low and instructed to present to the ED. Pt with GIB.  PMH:  CAD s/p NSTEMI 04/2021 treated with DES-RCA and residual multivessel CAD, CVA in 2005 and 2020, DM2, prostate cancer, HTN, and HLD  Clinical Impression  Pt admitted with above diagnosis. Pt's family states his gait is close to baseline with use of SBQC.   Pt requiring min guard to min assist today with gait in hospital environment.   Encouraged wife to provide support initially at home and for pt to use rollator for incr support. Also needs to get a new SBQC or get a tip on the leg that is missing. HHPT recommended for balance, safety awareness and strength training and HHOT to assess bathroom setup and safety. Will follow acutely. Pt currently with functional limitations due to the deficits listed below (see PT Problem List). Pt will benefit from skilled PT to increase their independence and safety with mobility to allow discharge to the venue listed below.          Recommendations for follow up therapy are one component of a multi-disciplinary discharge planning process, led by the attending physician.  Recommendations may be updated based on patient status, additional functional criteria and insurance authorization.  Follow Up Recommendations Home health PT ([place])      Assistance Recommended at Discharge Intermittent Supervision/Assistance  Patient can return home with the following  A little help with walking and/or transfers;Assist for transportation;Help with stairs or ramp for  entrance;Assistance with cooking/housework    Equipment Recommendations Other (comment) [facility])  Recommendations for Other Services       Functional Status Assessment Patient has had a recent decline in their functional status and demonstrates the ability to make significant improvements in function in a reasonable and predictable amount of time.     Precautions / Restrictions Precautions Precautions: Fall Restrictions Weight Bearing Restrictions: No      Mobility  Bed Mobility Overal bed mobility: Independent                  Transfers Overall transfer level: Needs assistance Equipment used: Quad cane Transfers: Sit to/from Stand Sit to Stand: Min assist, Min guard           General transfer comment: When pt had on hospital socks, difficulty with sit to stand as feet slipping and needed min assist to stand. Once pt put his shoes on, needed min guard assist to stand.    Ambulation/Gait Ambulation/Gait assistance: Min assist, Min guard Gait Distance (Feet): 175 Feet Assistive device: Quad cane Gait Pattern/deviations: Step-to pattern, Decreased step length - left, Decreased stride length, Decreased dorsiflexion - left, Drifts right/left, Wide base of support   Gait velocity interpretation: <1.31 ft/sec, indicative of household ambulator   General Gait Details: Pt needed cues to sequence quad cane and steps.  Pt was able to progress ambulation with occasional support by PT as pt at times unsteady especially with turns.  Family present and stated that gait appears at baseline and wife reports she didnt assist pt PTA. Pt is possibly  slightly weaker.  Encouraged wife to provide support initially at home and for pt to use rollator for incr support. Also needs to get a new SBQC or get a tip in the leg that is missing. Pt with very slow gait as well with unequal step length common and with incr hip flexion on left to compensate for limited dorsiflexion on left.  Stairs             Wheelchair Mobility    Modified Rankin (Stroke Patients Only)       Balance Overall balance assessment: Needs assistance Sitting-balance support: No upper extremity supported, Feet supported Sitting balance-Leahy Scale: Good     Standing balance support: Single extremity supported, Bilateral upper extremity supported, During functional activity Standing balance-Leahy Scale: Poor Standing balance comment: relies on at least 1UE support for balance.                             Pertinent Vitals/Pain Pain Assessment Pain Assessment: No/denies pain    Home Living Family/patient expects to be discharged to:: Private residence Living Arrangements: Spouse/significant other;Children Available Help at Discharge: Family;Available 24 hours/day (spouse states she can provide supervision only) Type of Home: House Home Access: Level entry       Home Layout: Two level;Able to live on main level with bedroom/bathroom Home Equipment: Rollator (4 wheels);Cane - quad Additional Comments: Pt's SBQC has a rubber tip missing and recommended to wife and pt to get a new SBQC. Pt reports one fall down stairs in last 4 months.    Prior Function Prior Level of Function : Independent/Modified Independent             Mobility Comments: Modif I with SBQC ADLs Comments: I with B/D     Hand Dominance   Dominant Hand: Right    Extremity/Trunk Assessment   Upper Extremity Assessment Upper Extremity Assessment: Defer to OT evaluation    Lower Extremity Assessment Lower Extremity Assessment: LLE deficits/detail;RLE deficits/detail RLE Deficits / Details: grossly 3+/5 LLE Deficits / Details: grossly 3/5    Cervical / Trunk Assessment Cervical / Trunk Assessment: Normal  Communication   Communication: No difficulties  Cognition Arousal/Alertness: Awake/alert Behavior During Therapy: WFL for tasks assessed/performed Overall Cognitive Status: Within Functional  Limits for tasks assessed                                          General Comments      Exercises     Assessment/Plan    PT Assessment Patient needs continued PT services  PT Problem List Decreased activity tolerance;Decreased balance;Decreased mobility;Decreased knowledge of use of DME;Decreased safety awareness;Decreased knowledge of precautions;Decreased strength       PT Treatment Interventions DME instruction;Gait training;Functional mobility training;Therapeutic activities;Therapeutic exercise;Balance training;Patient/family education    PT Goals (Current goals can be found in the Care Plan section)  Acute Rehab PT Goals Patient Stated Goal: to go home PT Goal Formulation: With patient Time For Goal Achievement: 02/09/22 Potential to Achieve Goals: Good    Frequency Min 3X/week     Co-evaluation               AM-PAC PT "6 Clicks" Mobility  Outcome Measure Help needed turning from your back to your side while in a flat bed without using bedrails?: None Help needed moving from lying on your  back to sitting on the side of a flat bed without using bedrails?: None Help needed moving to and from a bed to a chair (including a wheelchair)?: A Little Help needed standing up from a chair using your arms (e.g., wheelchair or bedside chair)?: A Little Help needed to walk in hospital room?: A Little Help needed climbing 3-5 steps with a railing? : A Lot 6 Click Score: 19    End of Session Equipment Utilized During Treatment: Gait belt Activity Tolerance: Patient limited by fatigue Patient left: in chair;with call bell/phone within reach;with chair alarm set;with family/visitor present Nurse Communication: Mobility status PT Visit Diagnosis: Muscle weakness (generalized) (M62.81)    Time: 7106-2694 PT Time Calculation (min) (ACUTE ONLY): 33 min   Charges:   PT Evaluation $PT Eval Moderate Complexity: 1 Mod PT Treatments $Gait Training: 8-22  mins        Aslin Farinas M,PT Acute Rehab Services [phone]   Alvira Philips 01/26/2022, 11:20 AM

## 2022-01-26 NOTE — TOC Transition Note (Signed)
Transition of Care [facility]) - CM/SW Discharge Note   Patient Details  Name: Jerry Moran MRN: [id number] Date of Birth: 23-Apr-1955  [facility] CM/SW Contact:  Curlene Labrum, RN Phone Number: 01/26/2022, 3:57 PM   Clinical Narrative:    CM met with the patient prior to discharge to home with family.  The patient lives with his wife at the home, who will be providing transportation to home by car.  The patient was given choice regarding home health services and he did not have a preference.  I called Lillia Mountain, RNCM with [facility] and she accepted him for PT services.  Home Health orders were placed and co-signed by attending physician.  The patient was given updated discharge AVS to include follow up at the [facility] along with home health services.   Final next level of care: [place] Barriers to Discharge: No Barriers Identified   Patient Goals and CMS Choice Patient states their goals for this hospitalization and ongoing recovery are:: to return home CMS Medicare.gov Compare Post Acute Care list provided to:: Patient Choice offered to / list presented to : Patient  Discharge Placement                       Discharge Plan and Services   Discharge Planning Services: CM Consult Post Acute Care Choice: Home Health                    HH Arranged: PT [facility] Agency: [place] [facility]) Date [facility] Agency Contacted: 01/26/22 Time [place]: [id number] Representative spoke with at [place]: Lillia Mountain, RNCM  Social Determinants of Health (SDOH) Interventions     Readmission Risk Interventions     No data to display

## 2022-01-26 NOTE — Discharge Summary (Signed)
Name: Jerry Moran MRN: [id number] DOB: Nov 04, 1955 66 y.o. PCP: [facility]  Date of Admission: 01/25/2022 12:15 PM Date of Discharge: 01/26/2022 Attending Physician: Axel Filler, *  Discharge Diagnosis: 1. Principal Problem:   GI bleed Active Problems:   Type 2 diabetes mellitus with hemoglobin A1c goal of less than 7.0% (HCC)   Essential hypertension   Stage 3a chronic kidney disease (CKD) (HCC)   Coronary artery disease   Discharge Medications: Allergies as of 01/26/2022   No Known Allergies      Medication List     STOP taking these medications    chlorthalidone 25 MG tablet Commonly known as: HYGROTON   lisinopril 40 MG tablet Commonly known as: ZESTRIL   metFORMIN 1000 MG tablet Commonly known as: GLUCOPHAGE   ticagrelor 90 MG Tabs tablet Commonly known as: BRILINTA       TAKE these medications    amLODipine 10 MG tablet Commonly known as: NORVASC Take 1 tablet (10 mg total) by mouth daily.   atorvastatin 80 MG tablet Commonly known as: LIPITOR Take 1 tablet (80 mg total) by mouth at bedtime.   Bayer Low Dose 81 MG tablet Generic drug: aspirin EC Take 81 mg by mouth daily.   blood glucose meter kit and supplies Kit Dispense based on patient and insurance preference. Use up to four times daily as directed. (FOR ICD-9 250.00, 250.01).   carvedilol 25 MG tablet Commonly known as: COREG Take 1 tablet (25 mg total) by mouth 2 (two) times daily with a meal.   empagliflozin 10 MG Tabs tablet Commonly known as: Jardiance Take 1 tablet (10 mg total) by mouth daily before breakfast.   ferrous sulfate 325 (65 FE) MG tablet Take 325 mg by mouth See admin instructions. Take 1 capsule by mouth on Monday, Wednesday and Fridays   glipiZIDE 5 MG tablet Commonly known as: GLUCOTROL Take 1 tablet (5 mg total) by mouth daily. What changed: when to take this   nitroGLYCERIN 0.4 MG SL tablet Commonly known as:  NITROSTAT Place 1 tablet (0.4 mg total) under the tongue every 5 (five) minutes as needed for chest pain.   oxybutynin 5 MG tablet Commonly known as: DITROPAN Take 2.5 mg by mouth 2 (two) times daily.   tamsulosin 0.4 MG Caps capsule Commonly known as: FLOMAX Take 0.4 mg by mouth daily.   Vitamin D3 125 MCG (5000 UT) Caps Take 1 capsule by mouth daily.        Disposition and follow-up:   Jerry Moran was discharged from [facility] in Stable condition.  At the hospital follow up visit please address:  1. Symptomatic anemia: Patient's hemoglobin improved to 9.0 after 2 units PRBCs.  Received Venofer 400 mg this morning.  Patient is scheduled for EGD and colonoscopy at [facility] on Tuesday, 10/31.  Please ensure he is no longer having bloody stools and that his hemoglobin remains stable by obtaining a CBC. Please continue to hold his Brilinta until the source of his bleeding has been identified and addressed. Will resume aspirin.  #AoCKD 3a vs. Progression of CKD: Patient with history of CKD 3a presents with AKI versus progression of his CKD.  Responded minimally to fluids.  Please follow-up with BMP to ensure no further worsening of his kidney disease.  Please ensure continued follow-up with nephrology. Will continue to hold his chlorthalidone, lisinopril,and metformin given worsened renal function. Please follow up these changes to ensure he has optimal BP and DMII  management in the setting of his declining kidney function.  2.  Labs / imaging needed at time of follow-up: CBC, BMP  3.  Pending labs/ test needing follow-up: None  Follow-up Appointments:   Hospital Course by problem list: 1. #Symptomatic anemia: The patient was sent to the ED by the [facility] providers due to his low hemoglobin on Wednesday, 10/25. On presentation, his hemoglobin was 6.1, and he endorsed several weeks of blood in his stools, fatigue, lightheadedness, and dyspnea on exertion. He was  given 2 units of pRBCs and a dose of Protonix for suspected upper GI bleed. GI was consulted, and planned to perform a EGD and colonoscopy, but the patient chose to follow-up with his previously scheduled EGD and colonoscopy on Tuesday, 10/31 at the [facility] hospital. After transfusion, an H&H showed his hemoglobin increased to 9.0, and he denied any further fatigue or dyspnea. An iron/anemia profile was collected and showed a decreased ferritin of 5. His iron deficit was calculated to be 580 mg and the patient was started on IV Venofer 400 mg. His home medications include Fe sulfate 325 mg MWF, so he will continue this following discharge.   #AoCKD 3a vs. Progression of CKD Followed by nephrology as an outpatient.  Creatinine of 2.49 on admission, elevated from 1.69 three months prior. His creatinine had not exceeded 2.0 in the past few years, concerning for progression of his CKD 3a vs AKI. The patient received LR 1 L bolus and his repeat CMP showed mild improvement of his creatinine.  We concluded this is likely progression of his CKD due to anemia for at least several weeks now.  Discharge Exam:   BP (!) 152/85 (BP Location: Right Arm)   Pulse 69   Temp 97.8 F (36.6 C) (Oral)   Resp 15   Ht 5' 7.5" (1.715 m)   Wt 82 kg   SpO2 99%   BMI 27.90 kg/m  Discharge exam: Physical Exam Constitutional:      General: He is not in acute distress. HENT:     Head: Normocephalic and atraumatic.  Eyes:     Extraocular Movements: Extraocular movements intact.  Cardiovascular:     Rate and Rhythm: Normal rate and regular rhythm.     Heart sounds: No murmur heard. Pulmonary:     Effort: Pulmonary effort is normal.     Breath sounds: No wheezing, rhonchi or rales.  Abdominal:     Comments: Distended, tympanic, non tender  Musculoskeletal:     Comments: Trace pitting edema at bilateral lower extremities  Skin:    General: Skin is warm and dry.  Neurological:     Mental Status: He is alert and oriented  to person, place, and time.  Psychiatric:        Mood and Affect: Mood normal.        Behavior: Behavior normal.      Pertinent Labs, Studies, and Procedures:     Latest Ref Rng & Units 01/26/2022    1:58 AM 01/25/2022   12:51 PM 10/20/2021    9:24 AM  CBC  WBC 4.0 - 10.5 K/uL  6.0  7.5   Hemoglobin 13.0 - 17.0 g/dL 9.0  6.1  9.0   Hematocrit 39.0 - 52.0 % 27.7  20.9  28.7   Platelets 150 - 400 K/uL  427  264       Latest Ref Rng & Units 01/26/2022    8:17 AM 01/25/2022   12:51 PM 10/20/2021  9:24 AM  CMP  Glucose 70 - 99 mg/dL 130  100  165   BUN 8 - 23 mg/dL _0 Creatinine 0.61 - 1.24 mg/dL 2.30  2.49  1.69   Sodium 135 - 145 mmol/L 139  142  139   Potassium 3.5 - 5.1 mmol/L 4.0  3.9  3.8   Chloride 98 - 111 mmol/L 110  111  105   CO2 22 - 32 mmol/L _1 Calcium 8.9 - 10.3 mg/dL 9.0  8.9  9.0   Total Protein 6.5 - 8.1 g/dL  7.0    Total Bilirubin 0.3 - 1.2 mg/dL  0.3    Alkaline Phos 38 - 126 U/L  81    AST 15 - 41 U/L  19    ALT 0 - 44 U/L  16      Discharge Instructions: Discharge Instructions     Diet - low sodium heart healthy   Complete by: As directed    Discharge instructions   Complete by: As directed    Mr. Leifheit,  It was a pleasure caring for you during your stay here at [facility]. You came in with low hemoglobin and blood in your stools. You received blood and iron and your hemoglobin improved. You are scheduled for a colonoscopy on Tuesday to further evaluate for a gastrointestinal bleed. You were also found to have some worsening of your chronic kidney disease. We will make the following changes to your medication regimen and you can discuss any further management with your primary care provider. -Please schedule an appointment with your primary care provider to discuss medication changes. We will discontinue metformin, chlorthalidone, and lisinopril given your worsened kidney function. Also, please only take your glipizide once a  day. -Your gastroenterologists will let you know when to restart Brilinta. Do no take this until discussing with them.   Increase activity slowly   Complete by: As directed        Signed: Linward Natal, MD 01/26/2022, 2:48 PM   Pager: [phone]

## 2022-01-26 NOTE — Plan of Care (Signed)

## 2022-02-11 ENCOUNTER — Emergency Department (HOSPITAL_COMMUNITY)
Admission: EM | Admit: 2022-02-11 | Discharge: 2022-02-12 | Discharge status: Against Medical Advice | Payer: No Typology Code available for payment source | Attending: Student | Admitting: Student

## 2022-02-11 ENCOUNTER — Emergency Department (HOSPITAL_COMMUNITY): Payer: No Typology Code available for payment source

## 2022-02-11 ENCOUNTER — Other Ambulatory Visit: Payer: Self-pay

## 2022-02-11 ENCOUNTER — Encounter (HOSPITAL_COMMUNITY): Payer: Self-pay

## 2022-02-11 DIAGNOSIS — N5089 Other specified disorders of the male genital organs: Secondary | ICD-10-CM | POA: Diagnosis present

## 2022-02-11 DIAGNOSIS — Z5321 Procedure and treatment not carried out due to patient leaving prior to being seen by health care provider: Secondary | ICD-10-CM | POA: Diagnosis not present

## 2022-02-11 NOTE — ED Provider Triage Note (Signed)
Emergency Medicine Provider Triage Evaluation Note  Jerry Moran , a 66 y.o. male  was evaluated in triage.  Pt complains of 2 to 3 days of scrotal swelling.  Patient also complains of tenderness in the scrotum.  Denies any dysuria, penile discharge.  Denies any fevers, nausea, vomiting, abdominal pain.  Review of Systems  Positive: As above Negative: As above  Physical Exam  There were no vitals taken for this visit. Gen:   Awake, no distress   Resp:  Normal effort  MSK:   Moves extremities without difficulty  Other:  Scrotum is diffusely swollen, tender  Medical Decision Making  Medically screening exam initiated at 2:02 PM.  Appropriate orders placed.  Jerry Moran was informed that the remainder of the evaluation will be completed by another provider, this initial triage assessment does not replace that evaluation, and the importance of remaining in the ED until their evaluation is complete.     Dorothyann Peng, [name] 02/11/22 [phone]

## 2022-02-11 NOTE — ED Triage Notes (Signed)
Patient said both of his testicles are swollen and painful. No problems with urination.

## 2022-02-21 NOTE — Progress Notes (Deleted)
Office Visit    Patient Name: Jerry Moran Date of Encounter: 02/21/2022  Primary Care Provider:  [facility] Primary Cardiologist:  Sinclair Grooms, MD Primary Electrophysiologist: None  Chief Complaint    Jerry Moran is a 66 y.o. male with PMH of CAD s/p NSTEMI 04/2021 treated with DES to RCA with residual multivessel CAD and moderate LAD, LCx and posterior OM1 with evidence of severe distal OM bifurcation, recent CVA in 2009 and 2020, DM type II, history of prostate CA, HTN who presents today for follow-up of coronary artery disease.  Past Medical History    Past Medical History:  Diagnosis Date   Coronary artery disease    Diabetes mellitus without complication ([place])    Hyperlipidemia    Hypertension    Prostate cancer ([place])    Stroke [facility])    Past Surgical History:  Procedure Laterality Date   CARDIAC CATHETERIZATION     CORONARY STENT INTERVENTION N/A 04/29/2021   Procedure: CORONARY STENT INTERVENTION;  Surgeon: Burnell Blanks, MD;  Location: [place] [facility];  Service: Cardiovascular;  Laterality: N/A;   INTRAVASCULAR PRESSURE WIRE/FFR STUDY N/A 04/29/2021   Procedure: INTRAVASCULAR PRESSURE WIRE/FFR STUDY;  Surgeon: Burnell Blanks, MD;  Location: [place] [facility];  Service: Cardiovascular;  Laterality: N/A;   LEFT HEART CATH AND CORONARY ANGIOGRAPHY N/A 04/29/2021   Procedure: LEFT HEART CATH AND CORONARY ANGIOGRAPHY;  Surgeon: Burnell Blanks, MD;  Location: [place] [facility];  Service: Cardiovascular;  Laterality: N/A;   PROSTATE BIOPSY      Allergies  No Known Allergies  History of Present Illness    Jerry Moran is a 66 year old male with the above-mentioned past medical history who presents today for evaluation of syncope.  He has a history of CVA -2009 and 2020 with Left paramedian pontine infarct secondary to small vessel disease and 2020 Left para median pons. Jerry Moran presented to the ED via  EMS on 04/28/2021 with complaint of substernal chest pain that was not reproducible.  Patient's ECG was unremarkable on arrival to ED and was given 324 mg ASA.  He was reported to be hypertensive with systolic blood pressures in the 140s to 170s and creatinine of 1.8.  High-sensitivity troponins trended at 321>>311 and patient was started on heparin drip.  Patient underwent LHC with DES to distal RCA and severe RCA stenosis, moderate LAD disease, and severe distal OM branch disease.  He was started on Brilinta and continue on ASA 81 mg for 1 year.  TTE was performed with EF of 60 to 65%, no RWMA, no LVH, and normal biatrial size. Patient was last seen 05/2021 for follow-up after NSTEMI and reported no chest pain or shortness of breath.  He participated in cardiac rehab and unfortunately fell and broke two toes after falling down stairs. Patient's wife recently contacted the office due to a syncopal episode that occurred in [place] on 10/14/2021. He was treated with IV fluids and had recovery full recovery. Patient was seen by me on 10/20/2021 for evaluation of syncopal episode.  Patient had a 14-day ZIO monitor and labs drawn consisting of CBC, BMET, TSH, and Mg that were all normal.  ZIO monitor results shows sinus rhythm with 4 runs of nonsustained VT and SVT with low PVC burden.  He reported no further episodes of syncope and blood pressure was well-controlled.  He was seen in the ED on 01/25/2022 for complaint of fatigue x 2 weeks.  He  also endorsed rectal bleeding and was transfused with 2 units of PRBC.  He underwent upper GI at the [place] that showed gastritis and H. pylori.  He was started on bismuth, metronidazole, doxycycline, omeprazole for treatment.   Since last being seen in the office patient reports***.  Patient denies chest pain, palpitations, dyspnea, PND, orthopnea, nausea, vomiting, dizziness, syncope, edema, weight gain, or early satiety.   ***Notes: -He was seen in the ED 10/25 for  progressive anemia he received 2 units of PRBC and is scheduled to undergo EGD at [facility] Medications    Current Outpatient Medications  Medication Sig Dispense Refill   amLODipine (NORVASC) 10 MG tablet Take 1 tablet (10 mg total) by mouth daily. 30 tablet 0   atorvastatin (LIPITOR) 80 MG tablet Take 1 tablet (80 mg total) by mouth at bedtime. 90 tablet 3   BAYER LOW DOSE 81 MG EC tablet Take 81 mg by mouth daily.     blood glucose meter kit and supplies KIT Dispense based on patient and insurance preference. Use up to four times daily as directed. (FOR ICD-9 250.00, 250.01). 1 each 0   carvedilol (COREG) 25 MG tablet Take 1 tablet (25 mg total) by mouth 2 (two) times daily with a meal. 60 tablet 6   Cholecalciferol (VITAMIN D3) 125 MCG (5000 UT) CAPS Take 1 capsule by mouth daily.     empagliflozin (JARDIANCE) 10 MG TABS tablet Take 1 tablet (10 mg total) by mouth daily before breakfast. 90 tablet 3   ferrous sulfate 325 (65 FE) MG tablet Take 325 mg by mouth See admin instructions. Take 1 capsule by mouth on Monday, Wednesday and Fridays     glipiZIDE (GLUCOTROL) 5 MG tablet Take 1 tablet (5 mg total) by mouth daily. 30 tablet 2   nitroGLYCERIN (NITROSTAT) 0.4 MG SL tablet Place 1 tablet (0.4 mg total) under the tongue every 5 (five) minutes as needed for chest pain. 25 tablet 3   oxybutynin (DITROPAN) 5 MG tablet Take 2.5 mg by mouth 2 (two) times daily.     tamsulosin (FLOMAX) 0.4 MG CAPS capsule Take 0.4 mg by mouth daily.     No current facility-administered medications for this visit.     Review of Systems  Please see the history of present illness.    (+)*** (+)***  All other systems reviewed and are otherwise negative except as noted above.  Physical Exam    Wt Readings from Last 3 Encounters:  01/25/22 180 lb 12.4 oz (82 kg)  11/22/21 181 lb 9.6 oz (82.4 kg)  10/20/21 182 lb 12.8 oz (82.9 kg)   EA:VWUJW were no vitals filed for this visit.,There is no height  or weight on file to calculate BMI.  Constitutional:      Appearance: Healthy appearance. Not in distress.  Neck:     Vascular: JVD normal.  Pulmonary:     Effort: Pulmonary effort is normal.     Breath sounds: No wheezing. No rales. Diminished in the bases Cardiovascular:     Normal rate. Regular rhythm. Normal S1. Normal S2.      Murmurs: There is no murmur.  Edema:    Peripheral edema absent.  Abdominal:     Palpations: Abdomen is soft non tender. There is no hepatomegaly.  Skin:    General: Skin is warm and dry.  Neurological:     General: No focal deficit present.     Mental Status: Alert and oriented to person, place  and time.     Cranial Nerves: Cranial nerves are intact.  EKG/LABS/Other Studies Reviewed    ECG personally reviewed by me today - ***  Risk Assessment/Calculations:   {Does this patient have ATRIAL FIBRILLATION?[phone]}        Lab Results  Component Value Date   WBC 6.0 01/25/2022   HGB 9.0 (L) 01/26/2022   HCT 27.7 (L) 01/26/2022   MCV 78.9 (L) 01/25/2022   PLT 427 (H) 01/25/2022   Lab Results  Component Value Date   CREATININE 2.30 (H) 01/26/2022   BUN 27 (H) 01/26/2022   NA 139 01/26/2022   K 4.0 01/26/2022   CL 110 01/26/2022   CO2 20 (L) 01/26/2022   Lab Results  Component Value Date   ALT 16 01/25/2022   AST 19 01/25/2022   ALKPHOS 81 01/25/2022   BILITOT 0.3 01/25/2022   Lab Results  Component Value Date   CHOL 148 04/29/2021   HDL 40 (L) 04/29/2021   LDLCALC 91 04/29/2021   TRIG 83 04/29/2021   CHOLHDL 3.7 04/29/2021    Lab Results  Component Value Date   HGBA1C 6.0 (H) 04/29/2021    Assessment & Plan    1.  Syncope: -Currently resolved with ZIO monitor revealing no sustained arrhythmias or pauses -He reports today that he is not had any syncopal episodes and is feeling much better -Based on results of most recent event monitor patient is cleared to resume driving at his convenience   2.  History of  CVA: -2009 and 01/2019 L paramedian pontine infarct secondary to small vessel disease and L para median pons. -Continue aspirin and Lipitor 80 mg daily -Carotid ultrasounds completed with no changes to previous studies -Patient encouraged to reach out to PCP regarding occupational and physical therapy to assist with mobility   3.  Hyperlipidemia: -Last LDL was was 91 above goal of less than 70 Continue statin therapy as noted above   4.  Hypertension: -Patient's blood pressure today was controlled at 134/70 -Continue lisinopril 40 mg daily, carvedilol 25 mg twice daily and amlodipine 10 mg daily   5.  DM type II: -Patient's last hemoglobin A1c was 6.0 -Continue metformin 1000 mg twice daily and glipizide 5 mg daily -We will add Jardiance 10 mg daily for renal protection    6.  CKD stage IIIa: -Most recent creatinine was 1.69 on 10/2021 -Patient is currently followed by nephrology -Jardiance 10 mg added as noted above   7.  Coronary artery disease: - s/p NSTEMI 04/2021 which revealed multivessel CAD with moderate LAD, LCx, and pOM1 with evidence of severe stenosis in distal OM1/bifurcation which were medically managed.  He was found to have 99% distal RCA stenosis which was managed with PCI/DES. -Patient denies any chest pain or shortness of breath today -Continue ASA and Brilinta, atorvastatin 80 mg, carvedilol 25 mg twice daily        Disposition: Follow-up with Jerry Crome III, MD or APP in *** months {Are you ordering a CV Procedure (e.g. stress test, cath, DCCV, TEE, etc)?   Press F2        :[id number]}   Medication Adjustments/Labs and Tests Ordered: Current medicines are reviewed at length with the patient today.  Concerns regarding medicines are outlined above.   Signed, Mable Fill, Marissa Nestle, NP 02/21/2022, 1:13 PM [facility]  Note:  This document was prepared using Dragon voice recognition software and may include unintentional dictation  errors.

## 2022-02-22 ENCOUNTER — Ambulatory Visit: Payer: No Typology Code available for payment source | Admitting: Nurse Practitioner

## 2022-02-22 DIAGNOSIS — I2510 Atherosclerotic heart disease of native coronary artery without angina pectoris: Secondary | ICD-10-CM

## 2022-03-23 ENCOUNTER — Telehealth: Payer: Self-pay | Admitting: Nurse Practitioner

## 2022-03-23 NOTE — Telephone Encounter (Signed)
New Message:     Amy called. She wants to know what medicine patient is supposed to be taking since his last office visit in August..

## 2022-03-23 NOTE — Telephone Encounter (Signed)
Spoke with Amy form [facility]. She states she was calling to discuss the patients medication. I read to her the patients meds. She states there were some meds missing but she did not know the dosage. I advised her that we will address the meds at the patients next appointment. She states the patient is very forgetful and I advised her to have the patients spouse come with him to the next appointment. She voiced understanding.

## 2022-04-27 NOTE — Progress Notes (Signed)
Office Visit    Patient Name: Jerry Moran Date of Encounter: 04/27/2022  Primary Care Provider:  [facility] Primary Cardiologist:  Sinclair Grooms, MD Primary Electrophysiologist: None  Chief Complaint    Jerry Moran is a 67 y.o. male with PMH of CAD s/p NSTEMI 04/2021 treated with DES to RCA with residual multivessel CAD and moderate LAD, LCx and posterior OM1 with evidence of severe distal OM bifurcation, recent CVA in 2009 and 2020, DM type II, history of prostate CA, HTN who presents today for follow-up of coronary artery disease.   Past Medical History    Past Medical History:  Diagnosis Date   Coronary artery disease    Diabetes mellitus without complication ([place])    Hyperlipidemia    Hypertension    Prostate cancer ([place])    Stroke [facility])    Past Surgical History:  Procedure Laterality Date   CARDIAC CATHETERIZATION     CORONARY STENT INTERVENTION N/A 04/29/2021   Procedure: CORONARY STENT INTERVENTION;  Surgeon: Burnell Blanks, MD;  Location: [place] [facility];  Service: Cardiovascular;  Laterality: N/A;   INTRAVASCULAR PRESSURE WIRE/FFR STUDY N/A 04/29/2021   Procedure: INTRAVASCULAR PRESSURE WIRE/FFR STUDY;  Surgeon: Burnell Blanks, MD;  Location: [place] [facility];  Service: Cardiovascular;  Laterality: N/A;   LEFT HEART CATH AND CORONARY ANGIOGRAPHY N/A 04/29/2021   Procedure: LEFT HEART CATH AND CORONARY ANGIOGRAPHY;  Surgeon: Burnell Blanks, MD;  Location: [place] [facility];  Service: Cardiovascular;  Laterality: N/A;   PROSTATE BIOPSY      Allergies  No Known Allergies  History of Present Illness    Jerry Moran is a 67 year old male with the above-mentioned past medical history who presents today for evaluation of syncope.  He has a history of CVA -2009 and 2020 with Left paramedian pontine infarct secondary to small vessel disease and 2020 Left para median pons. Jerry Moran presented to the ED via  EMS on 04/28/2021 with complaint of substernal chest pain that was not reproducible.  Patient's ECG was unremarkable on arrival to ED and was given 324 mg ASA.  He was reported to be hypertensive with systolic blood pressures in the 140s to 170s and creatinine of 1.8.  High-sensitivity troponins trended at 321>>311 and patient was started on heparin drip.  Patient underwent LHC with DES to distal RCA and severe RCA stenosis, moderate LAD disease, and severe distal OM branch disease.  He was started on Brilinta and continue on ASA 81 mg for 1 year.  TTE was performed with EF of 60 to 65%, no RWMA, no LVH, and normal biatrial size. Patient was last seen 05/2021 for follow-up after NSTEMI and reported no chest pain or shortness of breath.  He participated in cardiac rehab and unfortunately fell and broke two toes after falling down stairs. Patient's wife recently contacted the office due to a syncopal episode that occurred in [place] on 10/14/2021. He was treated with IV fluids and had recovery full recovery. Patient was seen by me on 10/20/2021 for evaluation of syncopal episode.  Patient had a 14-day ZIO monitor and labs drawn consisting of CBC, BMET, TSH, and Mg that were all normal.  ZIO monitor results shows sinus rhythm with 4 runs of nonsustained VT and SVT with low PVC burden.  He reported no further episodes of syncope and blood pressure was well-controlled.  He was seen in the ED on 01/25/2022 for complaint of fatigue x 2 weeks.  He also endorsed rectal bleeding and was transfused with 2 units of PRBC.  He underwent upper GI at the [place] that showed gastritis and H. pylori.  He was started on bismuth, metronidazole, doxycycline, omeprazole for treatment.   Mr. Jerry Moran presents today with his wife for follow-up.  Since last being seen in the office patient reports that he has been doing well with no new cardiac complaints.  He denies any presyncope or syncope and is compliant with his current medication  regimen.  His blood pressures today are well-controlled at 138/72 however his wife reports that his systolic pressures have been in the 140s 150s at home.  In discussing the patient's diet he reports that he pretty much eats everything and does not watch his salt.  He has lower extremity swelling bilaterally and is otherwise euvolemic on exam.  We discussed the DASH diet and the importance of abstaining from excess salt for controlling hypertension and decreasing cardiovascular risk.  He is currently being seen by PT/OT at the [place] he was recently seen in the [place] and had a repeat H. pylori test that was negative.  He reports that he is having occasional bloody stools that he is planning to discuss with his gastroenterologist.. Patient denies chest pain, palpitations, dyspnea, PND, orthopnea, nausea, vomiting, dizziness, syncope, edema, weight gain, or early satiety.   Home Medications    Current Outpatient Medications  Medication Sig Dispense Refill   amLODipine (NORVASC) 10 MG tablet Take 1 tablet (10 mg total) by mouth daily. 30 tablet 0   atorvastatin (LIPITOR) 80 MG tablet Take 1 tablet (80 mg total) by mouth at bedtime. 90 tablet 3   BAYER LOW DOSE 81 MG EC tablet Take 81 mg by mouth daily.     blood glucose meter kit and supplies KIT Dispense based on patient and insurance preference. Use up to four times daily as directed. (FOR ICD-9 250.00, 250.01). 1 each 0   carvedilol (COREG) 25 MG tablet Take 1 tablet (25 mg total) by mouth 2 (two) times daily with a meal. 60 tablet 6   Cholecalciferol (VITAMIN D3) 125 MCG (5000 UT) CAPS Take 1 capsule by mouth daily.     empagliflozin (JARDIANCE) 10 MG TABS tablet Take 1 tablet (10 mg total) by mouth daily before breakfast. 90 tablet 3   ferrous sulfate 325 (65 FE) MG tablet Take 325 mg by mouth See admin instructions. Take 1 capsule by mouth on Monday, Wednesday and Fridays     glipiZIDE (GLUCOTROL) 5 MG tablet Take 1 tablet (5 mg total) by mouth daily. 30  tablet 2   nitroGLYCERIN (NITROSTAT) 0.4 MG SL tablet Place 1 tablet (0.4 mg total) under the tongue every 5 (five) minutes as needed for chest pain. 25 tablet 3   oxybutynin (DITROPAN) 5 MG tablet Take 2.5 mg by mouth 2 (two) times daily.     tamsulosin (FLOMAX) 0.4 MG CAPS capsule Take 0.4 mg by mouth daily.     No current facility-administered medications for this visit.     Review of Systems  Please see the history of present illness.    (+) Occasional bloody stools (+) Lower extremity swelling  All other systems reviewed and are otherwise negative except as noted above.  Physical Exam    Wt Readings from Last 3 Encounters:  01/25/22 180 lb 12.4 oz (82 kg)  11/22/21 181 lb 9.6 oz (82.4 kg)  10/20/21 182 lb 12.8 oz (82.9 kg)   TD:HRCBU were no vitals  filed for this visit.,There is no height or weight on file to calculate BMI.  Constitutional:      Appearance: Healthy appearance. Not in distress.  Neck:     Vascular: JVD normal.  Pulmonary:     Effort: Pulmonary effort is normal.     Breath sounds: No wheezing. No rales. Diminished in the bases Cardiovascular:     Normal rate. Regular rhythm. Normal S1. Normal S2.      Murmurs: There is no murmur.  Edema:    Peripheral edema absent.  Abdominal:     Palpations: Abdomen is soft non tender. There is no hepatomegaly.  Skin:    General: Skin is warm and dry.  Neurological:     General: No focal deficit present.     Mental Status: Alert and oriented to person, place and time.     Cranial Nerves: Cranial nerves are intact.  EKG/LABS/Other Studies Reviewed    ECG personally reviewed by me today -none completed today  Lab Results  Component Value Date   WBC 6.0 01/25/2022   HGB 9.0 (L) 01/26/2022   HCT 27.7 (L) 01/26/2022   MCV 78.9 (L) 01/25/2022   PLT 427 (H) 01/25/2022   Lab Results  Component Value Date   CREATININE 2.30 (H) 01/26/2022   BUN 27 (H) 01/26/2022   NA 139 01/26/2022   K 4.0 01/26/2022   CL 110  01/26/2022   CO2 20 (L) 01/26/2022   Lab Results  Component Value Date   ALT 16 01/25/2022   AST 19 01/25/2022   ALKPHOS 81 01/25/2022   BILITOT 0.3 01/25/2022   Lab Results  Component Value Date   CHOL 148 04/29/2021   HDL 40 (L) 04/29/2021   LDLCALC 91 04/29/2021   TRIG 83 04/29/2021   CHOLHDL 3.7 04/29/2021    Lab Results  Component Value Date   HGBA1C 6.0 (H) 04/29/2021    Assessment & Plan    1. Coronary artery disease: - s/p NSTEMI 04/2021 which revealed multivessel CAD with moderate LAD, LCx, and pOM1 with evidence of severe stenosis in distal OM1/bifurcation which were medically managed.  He was found to have 99% distal RCA stenosis which was managed with PCI/DES. -Patient denies any chest pain or shortness of breath today -Continue ASA and Brilinta, atorvastatin 80 mg, carvedilol 25 mg twice daily   2. Hypertension: -Patient's blood pressure today was controlled at 138/72 -Continue carvedilol 25 mg twice daily and amlodipine 10 mg daily -Patient reports elevated systolic blood pressures at home and will record for next 2 weeks and report back to our clinic. -Blood pressures are remaining elevated we will increase carvedilol to 37.5 mg twice daily.   3.  Bilateral lower extremity swelling: -Patient is currently on Lasix 20 mg daily and we will increase to 40 mg twice daily x 2 days -He will take 10 mEq of potassium over 2 days and then will take as needed -He was advised to document weights and abstain from excess salt in his diet. -He was also encouraged to elevate his extremities and use compression stockings  4.   Hyperlipidemia: -Last LDL was was 91 above goal of less than 70 Continue statin therapy as noted above   5.  DM type II: -Patient's last hemoglobin A1c was 6.0 -Continue metformin 1000 mg twice daily and glipizide 5 mg daily -We will add Jardiance 10 mg daily for renal protection    6.  CKD stage IIIa: -Most recent creatinine was 1.69 on  10/2021 -Patient is currently followed by nephrology -Jardiance 10 mg added as noted above   7.  History of CVA: -2009 and 01/2019 L paramedian pontine infarct secondary to small vessel disease and L para median pons. -Continue aspirin and Lipitor 80 mg daily -Carotid ultrasounds completed with no changes to previous studies -Patient encouraged to reach out to PCP regarding occupational and physical therapy to assist with mobility   Disposition: Follow-up with Belva Crome III, MD or APP in 3 months    Medication Adjustments/Labs and Tests Ordered: Current medicines are reviewed at length with the patient today.  Concerns regarding medicines are outlined above.   Signed, Mable Fill, Marissa Nestle, NP 04/27/2022, 8:44 AM  [facility]  Note:  This document was prepared using Dragon voice recognition software and may include unintentional dictation errors.

## 2022-04-28 ENCOUNTER — Ambulatory Visit: Payer: No Typology Code available for payment source | Attending: Nurse Practitioner | Admitting: Nurse Practitioner

## 2022-04-28 ENCOUNTER — Encounter: Payer: Self-pay | Admitting: Nurse Practitioner

## 2022-04-28 VITALS — BP 138/72 | HR 77 | Ht 67.5 in | Wt 183.0 lb

## 2022-04-28 DIAGNOSIS — E785 Hyperlipidemia, unspecified: Secondary | ICD-10-CM

## 2022-04-28 DIAGNOSIS — I10 Essential (primary) hypertension: Secondary | ICD-10-CM

## 2022-04-28 DIAGNOSIS — N1831 Chronic kidney disease, stage 3a: Secondary | ICD-10-CM

## 2022-04-28 DIAGNOSIS — I2510 Atherosclerotic heart disease of native coronary artery without angina pectoris: Secondary | ICD-10-CM | POA: Diagnosis not present

## 2022-04-28 DIAGNOSIS — E119 Type 2 diabetes mellitus without complications: Secondary | ICD-10-CM | POA: Diagnosis not present

## 2022-04-28 DIAGNOSIS — M7989 Other specified soft tissue disorders: Secondary | ICD-10-CM

## 2022-04-28 MED ORDER — POTASSIUM CHLORIDE ER 10 MEQ PO TBCR: 10.0000 meq | EXTENDED_RELEASE_TABLET | Freq: Every day | ORAL | 0 refills | Status: DC

## 2022-04-28 NOTE — Patient Instructions (Signed)
Medication Instructions:  INCREASE Lasix to '40mg'$  Take 1 tablet daily for 2 days then go back to '20mg'$  once a day  START Potassium 10 meq take 1 tablet daily for 2 days then take as needed *If you need a refill on your cardiac medications before your next appointment, please call your pharmacy*   Lab Work: 2 weeks-BMET If you have labs (blood work) drawn today and your tests are completely normal, you will receive your results only by: [place] (if you have MyChart) OR A paper copy in the mail If you have any lab test that is abnormal or we need to change your treatment, we will call you to review the results.   Testing/Procedures: None    Follow-Up: At [facility], you and your health needs are our priority.  As part of our continuing mission to provide you with exceptional heart care, we have created designated Provider Care Teams.  These Care Teams include your primary Cardiologist (physician) and Advanced Practice Providers (APPs -  Physician Assistants and Nurse Practitioners) who all work together to provide you with the care you need, when you need it.  We recommend signing up for the patient portal called "MyChart".  Sign up information is provided on this After Visit Summary.  MyChart is used to connect with patients for Virtual Visits (Telemedicine).  Patients are able to view lab/test results, encounter notes, upcoming appointments, etc.  Non-urgent messages can be sent to your provider as well.   To learn more about what you can do with MyChart, go to [email or web address].    Your next appointment:   3 month(s)  Provider:   Early Osmond, MD Other Instructions Check your blood pressure daily for 2 weeks then contact the office with the readings.

## 2022-05-06 ENCOUNTER — Other Ambulatory Visit: Payer: Self-pay | Admitting: Nurse Practitioner

## 2022-05-07 ENCOUNTER — Emergency Department (HOSPITAL_COMMUNITY)
Admission: EM | Admit: 2022-05-07 | Discharge: 2022-05-07 | Disposition: A | Discharge status: Home / Self Care | Payer: No Typology Code available for payment source | Attending: Emergency Medicine | Admitting: Emergency Medicine

## 2022-05-07 ENCOUNTER — Emergency Department (HOSPITAL_COMMUNITY): Payer: No Typology Code available for payment source

## 2022-05-07 ENCOUNTER — Encounter (HOSPITAL_COMMUNITY): Payer: Self-pay

## 2022-05-07 ENCOUNTER — Other Ambulatory Visit: Payer: Self-pay

## 2022-05-07 DIAGNOSIS — Z7984 Long term (current) use of oral hypoglycemic drugs: Secondary | ICD-10-CM | POA: Diagnosis not present

## 2022-05-07 DIAGNOSIS — I10 Essential (primary) hypertension: Secondary | ICD-10-CM | POA: Insufficient documentation

## 2022-05-07 DIAGNOSIS — R531 Weakness: Secondary | ICD-10-CM | POA: Insufficient documentation

## 2022-05-07 DIAGNOSIS — R42 Dizziness and giddiness: Secondary | ICD-10-CM | POA: Diagnosis present

## 2022-05-07 DIAGNOSIS — E1165 Type 2 diabetes mellitus with hyperglycemia: Secondary | ICD-10-CM | POA: Diagnosis not present

## 2022-05-07 DIAGNOSIS — Z8673 Personal history of transient ischemic attack (TIA), and cerebral infarction without residual deficits: Secondary | ICD-10-CM | POA: Insufficient documentation

## 2022-05-07 DIAGNOSIS — Z79899 Other long term (current) drug therapy: Secondary | ICD-10-CM | POA: Diagnosis not present

## 2022-05-07 DIAGNOSIS — R739 Hyperglycemia, unspecified: Secondary | ICD-10-CM

## 2022-05-07 LAB — I-STAT CHEM 8, ED
BUN: 19 mg/dL (ref 8–23)
Calcium, Ion: 1.2 mmol/L (ref 1.15–1.40)
Chloride: 107 mmol/L (ref 98–111)
Creatinine, Ser: 2.1 mg/dL — ABNORMAL HIGH (ref 0.61–1.24)
Glucose, Bld: 179 mg/dL — ABNORMAL HIGH (ref 70–99)
HCT: 35 % — ABNORMAL LOW (ref 39.0–52.0)
Hemoglobin: 11.9 g/dL — ABNORMAL LOW (ref 13.0–17.0)
Potassium: 3.7 mmol/L (ref 3.5–5.1)
Sodium: 143 mmol/L (ref 135–145)
TCO2: 23 mmol/L (ref 22–32)

## 2022-05-07 LAB — COMPREHENSIVE METABOLIC PANEL
ALT: 20 U/L (ref 0–44)
AST: 22 U/L (ref 15–41)
Albumin: 3.3 g/dL — ABNORMAL LOW (ref 3.5–5.0)
Alkaline Phosphatase: 90 U/L (ref 38–126)
Anion gap: 10 (ref 5–15)
BUN: 18 mg/dL (ref 8–23)
CO2: 21 mmol/L — ABNORMAL LOW (ref 22–32)
Calcium: 9 mg/dL (ref 8.9–10.3)
Chloride: 107 mmol/L (ref 98–111)
Creatinine, Ser: 2.03 mg/dL — ABNORMAL HIGH (ref 0.61–1.24)
GFR, Estimated: 35 mL/min — ABNORMAL LOW (ref >60)
Glucose, Bld: 192 mg/dL — ABNORMAL HIGH (ref 70–99)
Potassium: 3.8 mmol/L (ref 3.5–5.1)
Sodium: 138 mmol/L (ref 135–145)
Total Bilirubin: 1.3 mg/dL — ABNORMAL HIGH (ref 0.3–1.2)
Total Protein: 6.6 g/dL (ref 6.5–8.1)

## 2022-05-07 LAB — CBC WITH DIFFERENTIAL/PLATELET
Abs Immature Granulocytes: 0.02 10*3/uL (ref 0.00–0.07)
Basophils Absolute: 0 10*3/uL (ref 0.0–0.1)
Basophils Relative: 0 %
Eosinophils Absolute: 0.2 10*3/uL (ref 0.0–0.5)
Eosinophils Relative: 2 %
HCT: 38.7 % — ABNORMAL LOW (ref 39.0–52.0)
Hemoglobin: 12.6 g/dL — ABNORMAL LOW (ref 13.0–17.0)
Immature Granulocytes: 0 %
Lymphocytes Relative: 4 %
Lymphs Abs: 0.4 10*3/uL — ABNORMAL LOW (ref 0.7–4.0)
MCH: 29.6 pg (ref 26.0–34.0)
MCHC: 32.6 g/dL (ref 30.0–36.0)
MCV: 90.8 fL (ref 80.0–100.0)
Monocytes Absolute: 1.1 10*3/uL — ABNORMAL HIGH (ref 0.1–1.0)
Monocytes Relative: 12 %
Neutro Abs: 7.6 10*3/uL (ref 1.7–7.7)
Neutrophils Relative %: 82 %
Platelets: 189 10*3/uL (ref 150–400)
RBC: 4.26 MIL/uL (ref 4.22–5.81)
RDW: 15.5 % (ref 11.5–15.5)
WBC: 9.3 10*3/uL (ref 4.0–10.5)
nRBC: 0 % (ref 0.0–0.2)

## 2022-05-07 LAB — CBG MONITORING, ED: Glucose-Capillary: 199 mg/dL — ABNORMAL HIGH (ref 70–99)

## 2022-05-07 LAB — TROPONIN I (HIGH SENSITIVITY): Troponin I (High Sensitivity): 14 ng/L (ref <18)

## 2022-05-07 LAB — PROTIME-INR
INR: 1.1 (ref 0.8–1.2)
Prothrombin Time: 14 seconds (ref 11.4–15.2)

## 2022-05-07 LAB — APTT: aPTT: 27 seconds (ref 24–36)

## 2022-05-07 MED ORDER — SODIUM CHLORIDE 0.9% FLUSH
3.0000 mL | Freq: Once | INTRAVENOUS | Status: AC
  Administered 2022-05-07: 3 mL via INTRAVENOUS

## 2022-05-07 MED ORDER — SODIUM CHLORIDE 0.9 % IV BOLUS
1000.0000 mL | Freq: Once | INTRAVENOUS | Status: AC
  Administered 2022-05-07: 1000 mL via INTRAVENOUS

## 2022-05-07 NOTE — ED Triage Notes (Signed)
Arrives via EMS weakness, dizziness, lightheaded that just start this morning. BO 83/56 by EMS, unable to ambulate for EMS. BP 142/82, HR 64, CBG 205, 97.8,

## 2022-05-07 NOTE — Discharge Instructions (Addendum)
As we discussed, your head CT looks normal.  You also do not appear to be having any heart damage at this time.  That you became lightheaded from dehydration or elevated blood sugar.  As we discussed, you should take your blood sugar at least once daily and please call your PCP for an appointment tomorrow.  You likely need better control of your diabetes to avoid further episodes such as this 1, heart attacks and strokes.  Do not hesitate to return with any worsening or recurring symptoms.  It was a pleasure to meet you and we hope you feel better!

## 2022-05-07 NOTE — ED Provider Notes (Signed)
[facility] Provider Note   CSN: [id number] Arrival date & time: 05/07/22  1020     History  Chief Complaint  Patient presents with   Weakness    Starting the AM   Hypotension    BP 83/54 on arrival, SBP 141 in truck    Jerry Moran is a 67 y.o. male with a past medical history of type 2 diabetes, hypertension, CVA and NSTEMI presenting today with an episode of lightheadedness and weakness.  He reports that this morning he was ambulating to the table and all of a sudden became weak all over and slightly lightheaded.  Denies any syncopal episodes.  Did not have chest pain or shortness of breath at this time.  Currently says that he feels completely normal and that it only lasted around 30 minutes. He says he thinks it is because he is dehydrated and takes a fluid pill every night.  Does not check his blood sugar every day but says that he checks it regularly.   Weakness      Home Medications Prior to Admission medications   Medication Sig Start Date End Date Taking? Authorizing Provider  amLODipine (NORVASC) 10 MG tablet Take 1 tablet (10 mg total) by mouth daily. 01/16/19   Kayleen Memos, DO  atorvastatin (LIPITOR) 80 MG tablet Take 1 tablet (80 mg total) by mouth at bedtime. 04/30/21   [name], Lorelee Cover., [name]  BAYER LOW DOSE 81 MG EC tablet Take 81 mg by mouth daily.    [provider]  blood glucose meter kit and supplies KIT Dispense based on patient and insurance preference. Use up to four times daily as directed. (FOR ICD-9 250.00, 250.01). 01/16/19   Kayleen Memos, DO  carvedilol (COREG) 25 MG tablet Take 1 tablet (25 mg total) by mouth 2 (two) times daily with a meal. 04/30/21   [name], Lorelee Cover., [name]  Cholecalciferol (VITAMIN D3) 125 MCG (5000 UT) CAPS Take 1 capsule by mouth daily.    [provider]  empagliflozin (JARDIANCE) 10 MG TABS tablet Take 1 tablet (10 mg total) by mouth daily before breakfast.  11/22/21   Marylu Lund., NP  ferrous sulfate 325 (65 FE) MG tablet Take 325 mg by mouth See admin instructions. Take 1 capsule by mouth on Monday, Wednesday and Fridays    [provider]  furosemide (LASIX) 20 MG tablet Take 20 mg by mouth daily. 04/04/22   [provider]  glipiZIDE (GLUCOTROL) 5 MG tablet Take 1 tablet (5 mg total) by mouth daily. 01/26/22   Linward Natal, MD  nitroGLYCERIN (NITROSTAT) 0.4 MG SL tablet Place 1 tablet (0.4 mg total) under the tongue every 5 (five) minutes as needed for chest pain. 04/30/21   [name], Lorelee Cover., [name]  oxybutynin (DITROPAN) 5 MG tablet Take 2.5 mg by mouth 2 (two) times daily.    [provider]  potassium chloride (KLOR-CON) 10 MEQ tablet Take 1 tablet (10 mEq total) by mouth daily. 04/28/22   Marylu Lund., NP  tamsulosin (FLOMAX) 0.4 MG CAPS capsule Take 0.4 mg by mouth daily.    [provider]      Allergies    Patient has no known allergies.    Review of Systems   Review of Systems  Neurological:  Positive for weakness.    Physical Exam Updated Vital Signs BP (!) 165/102 (BP Location: Right Arm)   Pulse 64   Temp Marland Kitchen)  97.5 F (36.4 C) (Oral)   Resp 13   SpO2 100%  Physical Exam Vitals and nursing note reviewed.  Constitutional:      Appearance: Normal appearance.  HENT:     Head: Normocephalic and atraumatic.  Eyes:     General: No scleral icterus.    Conjunctiva/sclera: Conjunctivae normal.  Cardiovascular:     Rate and Rhythm: Normal rate and regular rhythm.  Pulmonary:     Effort: Pulmonary effort is normal. No respiratory distress.     Breath sounds: No wheezing.  Skin:    Findings: No rash.  Neurological:     Mental Status: He is alert.  Psychiatric:        Mood and Affect: Mood normal.     Comments: Cranial nerves II through XII grossly intact.  No dysmetria.  Normal strength in bilateral upper and lower extremities.  No problem with finger-nose testing.  No slurred  speech, facial droop or ataxia.     ED Results / Procedures / Treatments   Labs (all labs ordered are listed, but only abnormal results are displayed) Labs Reviewed  CBC WITH DIFFERENTIAL/PLATELET  PROTIME-INR  APTT  COMPREHENSIVE METABOLIC PANEL  ETHANOL  CBG MONITORING, ED  I-STAT CHEM 8, ED  TROPONIN I (HIGH SENSITIVITY)    EKG EKG Interpretation  Date/Time:  Sunday May 07 2022 10:24:39 EST Ventricular Rate:  66 PR Interval:  151 QRS Duration: 141 QT Interval:  441 QTC Calculation: 463 R Axis:   75 Text Interpretation: Sinus rhythm Right bundle branch block flipped t waves in anterior leads seen on prior though not most recent Otherwise no significant change Confirmed by Deno Etienne [phone]) on 05/07/2022 10:29:40 AM  Radiology No results found.  Procedures Procedures   Medications Ordered in ED Medications  sodium chloride flush (NS) 0.9 % injection 3 mL (has no administration in time range)  sodium chloride 0.9 % bolus 1,000 mL (has no administration in time range)    ED Course/ Medical Decision Making/ A&P                             Medical Decision Making Amount and/or Complexity of Data Reviewed Labs: ordered. Radiology: ordered. ECG/medicine tests: ordered.   67 year old male presenting today due to 30 minutes of weakness and some lightheadedness.  Differential includes but is not limited to cardiac arrhythmia, ACS, dissection, CVA/TIA, blood sugar problem, orthostasis, hypotension.  This is not an exhaustive differential.    Past Medical History / Co-morbidities / Social History: Type 2 diabetes, hypertension, hyperlipidemia, history of CVA, NSTEMI January 2023  Additional history: Per external chart review patient follows with cardiology for CAD and NSTEMI in January 2023.  At that time had a 99% distal RCA stenosis that was managed with a DES.  Echocardiogram done in 2020 showed EF of 60-65  CVA in 2009 and 2020 in the left paramedian  pontine region in 2019 and the left paramedian pons in 2020  After an episode of syncope, patient wore ZIO monitor in July 2023 with no sustained arrhythmias.  physical Exam: Pertinent physical exam findings include   Lab Tests: I ordered, and personally interpreted labs.  The pertinent results include: Creatinine 2.0, appears to be around patient's baseline Blood sugar in the high 190s   Imaging Studies: Ordered, viewed and interpreted patient's head CT.  Agree with radiology that there are no acute findings   Cardiac Monitoring:  The patient was maintained  on a cardiac monitor.  I viewed and interpreted the cardiac monitored which showed an underlying rhythm of: Sinus   Medications: IV bolus, reports feeling better   MDM/Disposition: This is a 67 year old male with hypertension, hyperlipidemia, diabetes, NSTEMI and CVA who presented today for around 30 minutes of lightheadedness.  Reports that it started while he was walking to the table.  Says that this has happened numerous other times and that it was secondary to dehydration.  Denies any numbness or tingling.  No unilateral weakness, just said that he felt overall malaised.  No chest pain or shortness of breath at that time.  Does not check his blood sugar at home but says it usually runs in the 200s when he does check it.  ACS workup negative and patient did not have chest pain or shortness of breath to begin with.  Stroke panel also negative and head CT also negative.  No arrhythmia on CT.  Normal physical exam.  Patient's creatinine is around his baseline, may be slightly elevated.  Question worsening kidney function versus mild AKI/dehydration.  He was treated with a bolus of fluids.  Reports feeling much better.  Do not believe he has an emergent condition requiring any further consults or intervention today.  Believe he is stable for discharge home with PCP follow-up about his hyperglycemia and kidney function.  I spoke about this  at bedside with the patient and his wife who agreed with this plan.   Final Clinical Impression(s) / ED Diagnoses Final diagnoses:  Lightheadedness  Hyperglycemia    Rx / DC Orders ED Discharge Orders     None      Results and diagnoses were explained to the patient. Return precautions discussed in full. Patient had no additional questions and expressed complete understanding.   This chart was dictated using voice recognition software.  Despite best efforts to proofread,  errors can occur which can change the documentation meaning.     Rhae Hammock, [name] 05/07/22 Crandon, Dexter, DO 05/07/22 1243

## 2022-05-12 ENCOUNTER — Ambulatory Visit: Payer: No Typology Code available for payment source | Attending: Nurse Practitioner

## 2022-05-12 DIAGNOSIS — I2510 Atherosclerotic heart disease of native coronary artery without angina pectoris: Secondary | ICD-10-CM

## 2022-05-13 LAB — BASIC METABOLIC PANEL
BUN/Creatinine Ratio: 9 — ABNORMAL LOW (ref 10–24)
BUN: 18 mg/dL (ref 8–27)
CO2: 22 mmol/L (ref 20–29)
Calcium: 8.9 mg/dL (ref 8.6–10.2)
Chloride: 104 mmol/L (ref 96–106)
Creatinine, Ser: 1.96 mg/dL — ABNORMAL HIGH (ref 0.76–1.27)
Glucose: 243 mg/dL — ABNORMAL HIGH (ref 70–99)
Potassium: 3.8 mmol/L (ref 3.5–5.2)
Sodium: 138 mmol/L (ref 134–144)
eGFR: 37 mL/min/{1.73_m2} — ABNORMAL LOW (ref >59)

## 2022-05-15 ENCOUNTER — Telehealth: Payer: Self-pay | Admitting: Nurse Practitioner

## 2022-05-15 NOTE — Telephone Encounter (Signed)
Patient's wife Almyra Free returned call for lab results.

## 2022-05-15 NOTE — Telephone Encounter (Signed)
Spoke with the patients wife and gave her lab results. She voiced understanding.

## 2022-06-26 NOTE — Progress Notes (Unsigned)
Office Visit    Patient Name: Jerry Moran Date of Encounter: 06/27/2022  Primary Care Provider:  [facility], [facility] Primary Cardiologist:  None Primary Electrophysiologist: None  Chief Complaint    Jerry Moran is a 67 y.o. male with PMH of CAD s/p NSTEMI 04/2021 treated with DES to RCA with residual multivessel CAD and moderate LAD, LCx and posterior OM1 with evidence of severe distal OM bifurcation, recent CVA in 2009 and 2020, DM type II, history of prostate CA, HTN who presents today for follow-up of coronary artery disease.   Past Medical History    Past Medical History:  Diagnosis Date   Coronary artery disease    Diabetes mellitus without complication ([place])    Hyperlipidemia    Hypertension    Prostate cancer ([place])    Stroke [facility])    Past Surgical History:  Procedure Laterality Date   CARDIAC CATHETERIZATION     CORONARY STENT INTERVENTION N/A 04/29/2021   Procedure: CORONARY STENT INTERVENTION;  Surgeon: Burnell Blanks, MD;  Location: [place] [facility];  Service: Cardiovascular;  Laterality: N/A;   INTRAVASCULAR PRESSURE WIRE/FFR STUDY N/A 04/29/2021   Procedure: INTRAVASCULAR PRESSURE WIRE/FFR STUDY;  Surgeon: Burnell Blanks, MD;  Location: [place] [facility];  Service: Cardiovascular;  Laterality: N/A;   LEFT HEART CATH AND CORONARY ANGIOGRAPHY N/A 04/29/2021   Procedure: LEFT HEART CATH AND CORONARY ANGIOGRAPHY;  Surgeon: Burnell Blanks, MD;  Location: [place] [facility];  Service: Cardiovascular;  Laterality: N/A;   PROSTATE BIOPSY      Allergies  No Known Allergies  History of Present Illness    Jerry Moran is a 68 year old male with the above-mentioned past medical history who presents today for 19-month follow-up.  He has a history of CVA-2009 and 2020 with Left paramedian pontine infarct secondary to small vessel disease and 2020 Left para median pons. Jerry Moran presented to the ED via EMS on 04/28/2021 with  complaint of substernal chest pain that was not reproducible.  Patient's ECG was unremarkable on arrival to ED and was given 324 mg ASA.  He was reported to be hypertensive with systolic blood pressures in the 140s to 170s and creatinine of 1.8.  High-sensitivity troponins trended at 321>>311 and patient was started on heparin drip.  Patient underwent LHC with DES to distal RCA and severe RCA stenosis, moderate LAD disease, and severe distal OM branch disease.  He was started on Brilinta and continue on ASA 81 mg for 1 year.  TTE was performed with EF of 60 to 65%, no RWMA, no LVH, and normal biatrial size. Patient was last seen 05/2021 for follow-up after NSTEMI and reported no chest pain or shortness of breath.  He participated in cardiac rehab and unfortunately fell and broke two toes after falling down stairs. Patient's wife recently contacted the office due to a syncopal episode that occurred in [place] on 10/14/2021. He was treated with IV fluids and had recovery full recovery. Patient was seen by me on 10/20/2021 for evaluation of syncopal episode.  Patient had a 14-day ZIO monitor and labs drawn consisting of CBC, BMET, TSH, and Mg that were all normal.  ZIO monitor results shows sinus rhythm with 4 runs of nonsustained VT and SVT with low PVC burden.  He reported no further episodes of syncope and blood pressure was well-controlled.  He was seen in the ED on 01/25/2022 for complaint of fatigue x 2 weeks.  He also endorsed rectal bleeding and  was transfused with 2 units of PRBC.  He underwent upper GI at the [place] that showed gastritis and H. pylori.  He was started on bismuth, metronidazole, doxycycline, omeprazole for treatment.  He was seen in follow-up 04/28/2022 and was volume up on exam and reported indiscretions with salt.  Carvedilol was increased to 37.5 mg twice daily.  Lasix was increased for 2 days and patient encouraged to monitor volume status.  He was seen in the ED 05/07/2022 with complaint of  weakness with lightheadedness.  ACS workup was negative and CT of the head was also negative.  He was treated with a fluid bolus and felt much better.  He was discharged in stable condition.   Jerry Moran presents today for 40-month follow-up. Since last being seen in the office patient reports that he has been doing well and has not had any recurrence of dehydration or weakness.  He is currently increasing his physical activity and has been walking twice daily.  His blood pressure today is well-controlled at 138/72 was 60 bpm.  He was euvolemic on examination today.  He reports no pressure action with his current medications.  During today's visit we discussed the importance of abstaining from excess salt and watching his fluid intake.  He was reminded to keep his fluid intake under 64 ounces daily.  We also further discussed the importance of monitoring blood pressure and contacting us if blood pressure remains elevated.  Patient denies chest pain, palpitations, dyspnea, PND, orthopnea, nausea, vomiting, dizziness, syncope, edema, weight gain, or early satiety.   Home Medications    Current Outpatient Medications  Medication Sig Dispense Refill   amLODipine (NORVASC) 10 MG tablet Take 1 tablet (10 mg total) by mouth daily. 30 tablet 0   atorvastatin (LIPITOR) 80 MG tablet Take 1 tablet (80 mg total) by mouth at bedtime. 90 tablet 3   blood glucose meter kit and supplies KIT Dispense based on patient and insurance preference. Use up to four times daily as directed. (FOR ICD-9 250.00, 250.01). 1 each 0   Cholecalciferol (VITAMIN D3) 125 MCG (5000 UT) CAPS Take 1 capsule by mouth daily.     empagliflozin (JARDIANCE) 10 MG TABS tablet Take 1 tablet (10 mg total) by mouth daily before breakfast. 90 tablet 3   ferrous sulfate 325 (65 FE) MG tablet Take 325 mg by mouth See admin instructions. Take 1 capsule by mouth on Monday, Wednesday and Fridays     furosemide (LASIX) 20 MG tablet Take 20 mg by mouth  daily.     glipiZIDE (GLUCOTROL) 5 MG tablet Take 1 tablet (5 mg total) by mouth daily. 30 tablet 2   metoprolol succinate (TOPROL-XL) 50 MG 24 hr tablet Take 75 mg by mouth daily. Pt takes 1&1/2 tablets once a day.     nitroGLYCERIN (NITROSTAT) 0.4 MG SL tablet Place 1 tablet (0.4 mg total) under the tongue every 5 (five) minutes as needed for chest pain. 25 tablet 3   oxybutynin (DITROPAN) 5 MG tablet Take 2.5 mg by mouth 2 (two) times daily.     potassium chloride (KLOR-CON) 10 MEQ tablet TAKE 1 TABLET BY MOUTH EVERY DAY 90 tablet 3   tamsulosin (FLOMAX) 0.4 MG CAPS capsule Take 0.4 mg by mouth daily.     ticagrelor (BRILINTA) 90 MG TABS tablet Take 90 mg by mouth 2 (two) times daily. Pt takes 1 tablet BID.     No current facility-administered medications for this visit.     Review of  Systems  Please see the history of present illness.    (+) Shortness of breath with heavy exertion (+) Trace lower extremity edema completed today  All other systems reviewed and are otherwise negative except as noted above.  Physical Exam    Wt Readings from Last 3 Encounters:  06/27/22 183 lb 3.2 oz (83.1 kg)  04/28/22 183 lb (83 kg)  01/25/22 180 lb 12.4 oz (82 kg)   VS: Vitals:   06/27/22 0959  BP: 138/72  Pulse: 62  SpO2: 97%  ,Body mass index is 28.27 kg/m.  Constitutional:      Appearance: Healthy appearance. Not in distress.  Neck:     Vascular: JVD normal.  Pulmonary:     Effort: Pulmonary effort is normal.     Breath sounds: No wheezing. No rales. Diminished in the bases Cardiovascular:     Normal rate. Regular rhythm. Normal S1. Normal S2.      Murmurs: There is no murmur.  Edema:    Peripheral edema absent.  Abdominal:     Palpations: Abdomen is soft non tender. There is no hepatomegaly.  Skin:    General: Skin is warm and dry.  Neurological:     General: No focal deficit present.     Mental Status: Alert and oriented to person, place and time.     Cranial Nerves:  Cranial nerves are intact.  EKG/LABS/ Recent Cardiac Studies    ECG personally reviewed by me today -none completed today   Lab Results  Component Value Date   WBC 9.3 05/07/2022   HGB 11.9 (L) 05/07/2022   HCT 35.0 (L) 05/07/2022   MCV 90.8 05/07/2022   PLT 189 05/07/2022   Lab Results  Component Value Date   CREATININE 1.96 (H) 05/12/2022   BUN 18 05/12/2022   NA 138 05/12/2022   K 3.8 05/12/2022   CL 104 05/12/2022   CO2 22 05/12/2022   Lab Results  Component Value Date   ALT 20 05/07/2022   AST 22 05/07/2022   ALKPHOS 90 05/07/2022   BILITOT 1.3 (H) 05/07/2022   Lab Results  Component Value Date   CHOL 148 04/29/2021   HDL 40 (L) 04/29/2021   LDLCALC 91 04/29/2021   TRIG 83 04/29/2021   CHOLHDL 3.7 04/29/2021    Lab Results  Component Value Date   HGBA1C 6.0 (H) 04/29/2021    Cardiac Studies & Procedures   CARDIAC CATHETERIZATION  CARDIAC CATHETERIZATION 04/29/2021  Narrative   Dist RCA lesion is 99% stenosed.   1st Mrg-1 lesion is 60% stenosed.   Prox Cx to Mid Cx lesion is 40% stenosed.   1st Mrg-2 lesion is 80% stenosed.   Lat 1st Mrg lesion is 80% stenosed.   Prox LAD to Mid LAD lesion is 50% stenosed.   A drug-eluting stent was successfully placed using a STENT ONYX FRONTIER 2.5X12.   Post intervention, there is a 0% residual stenosis.  The LAD is a large caliber vessel that courses to the apex. The mid LAD has moderate stenosis. RFR 0.92 suggesting this lesion is not flow limiting. The Circumflex is a large vessel. The first obtuse marginal branch is a large bifurcating vessel. The proximal segment of the obtuse marginal branch has a moderate stenosis. The vessel then bifurcates. There is diffuse severe disease in both distal sub-branches of the obtuse marginal branch. The RCA is a large dominant vessel with severe stenosis just before the bifurcation. This is felt to be the culprit lesion. Successful  PTCA/DES x 1 distal RCA Mild elevation LV  filling pressures  Recommendations: DAPT with ASA and Brilinta for one year. High intensity statin. Beta blocker as tolerated. Echo is pending. Recheck renal function tomorrow. Medical management of residual CAD in the Circumflex artery. Case reviewed with IC team.  Findings Coronary Findings Diagnostic  Dominance: Right  Left Anterior Descending Prox LAD to Mid LAD lesion is 50% stenosed.  Left Circumflex Vessel is large. Prox Cx to Mid Cx lesion is 40% stenosed.  First Obtuse Marginal Branch Vessel is large in size. 1st Mrg-1 lesion is 60% stenosed. 1st Mrg-2 lesion is 80% stenosed.  Lateral First Obtuse Marginal Branch Lat 1st Mrg lesion is 80% stenosed.  Right Coronary Artery Vessel is large. Dist RCA lesion is 99% stenosed.  Intervention  Dist RCA lesion Stent CATH LAUNCHER 6FR AL.75 guide catheter was inserted. Lesion crossed with guidewire using a WIRE COUGAR XT STRL 190CM. Pre-stent angioplasty was performed using a BALLN SAPPHIRE 2.0X12. A drug-eluting stent was successfully placed using a STENT ONYX FRONTIER 2.5X12. Stent strut is well apposed. Post-stent angioplasty was performed using a BALLN [place] EUPHORA RX 2.75X8. Post-Intervention Lesion Assessment The intervention was successful. Pre-interventional TIMI flow is 3. Post-intervention TIMI flow is 3. No complications occurred at this lesion. There is a 0% residual stenosis post intervention.     ECHOCARDIOGRAM  ECHOCARDIOGRAM COMPLETE 04/29/2021  Narrative ECHOCARDIOGRAM REPORT    Patient Name:   Jerry Moran Date of Exam: 04/29/2021 Medical Rec #:  [id number]         Height:       68.0 in Accession #:    [id number]        Weight:       175.0 lb Date of Birth:  01-17-1956         BSA:          1.931 m Patient Age:    39 years          BP:           176/88 mmHg Patient Gender: M                 HR:           74 bpm. Exam Location:  Inpatient  Procedure: 2D Echo and Strain Analysis  Indications:     acute myocardial infarction  History:        Patient has prior history of Echocardiogram examinations, most recent 01/14/2019. Risk Factors:Dyslipidemia.  Sonographer:    Johny Chess RDCS Referring Phys: [id number] [place]   1. Left ventricular ejection fraction, by estimation, is 60 to 65%. The left ventricle has normal function. The left ventricle has no regional wall motion abnormalities. Left ventricular diastolic parameters are indeterminate. Elevated left ventricular end-diastolic pressure. 2. Right ventricular systolic function is normal. The right ventricular size is normal. Tricuspid regurgitation signal is inadequate for assessing PA pressure. 3. The mitral valve is normal in structure. Mild mitral valve regurgitation. No evidence of mitral stenosis. 4. The aortic valve is normal in structure. Aortic valve regurgitation is trivial. No aortic stenosis is present. 5. The inferior vena cava is normal in size with greater than 50% respiratory variability, suggesting right atrial pressure of 3 mmHg.  FINDINGS Left Ventricle: Left ventricular ejection fraction, by estimation, is 60 to 65%. The left ventricle has normal function. The left ventricle has no regional wall motion abnormalities. Global longitudinal strain performed but not reported based on interpreter judgement due to  suboptimal tracking. The left ventricular internal cavity size was normal in size. There is no left ventricular hypertrophy. Left ventricular diastolic parameters are indeterminate. Elevated left ventricular end-diastolic pressure.  Right Ventricle: The right ventricular size is normal. No increase in right ventricular wall thickness. Right ventricular systolic function is normal. Tricuspid regurgitation signal is inadequate for assessing PA pressure.  Left Atrium: Left atrial size was normal in size.  Right Atrium: Right atrial size was normal in size.  Pericardium: There is no  evidence of pericardial effusion.  Mitral Valve: The mitral valve is normal in structure. Mild mitral valve regurgitation. No evidence of mitral valve stenosis.  Tricuspid Valve: The tricuspid valve is normal in structure. Tricuspid valve regurgitation is trivial. No evidence of tricuspid stenosis.  Aortic Valve: The aortic valve is normal in structure. Aortic valve regurgitation is trivial. No aortic stenosis is present.  Pulmonic Valve: The pulmonic valve was normal in structure. Pulmonic valve regurgitation is trivial. No evidence of pulmonic stenosis.  Aorta: The aortic root is normal in size and structure.  Venous: The inferior vena cava is normal in size with greater than 50% respiratory variability, suggesting right atrial pressure of 3 mmHg.  IAS/Shunts: No atrial level shunt detected by color flow Doppler.   LEFT VENTRICLE PLAX 2D LVIDd:         5.00 cm   Diastology LVIDs:         3.40 cm   LV e' medial:    5.77 cm/s LV PW:         1.30 cm   LV E/e' medial:  17.5 LV IVS:        1.00 cm   LV e' lateral:   5.66 cm/s LVOT diam:     2.10 cm   LV E/e' lateral: 17.8 LV SV:         69 LV SV Index:   36 LVOT Area:     3.46 cm   RIGHT VENTRICLE             IVC RV S prime:     11.50 cm/s  IVC diam: 1.80 cm TAPSE (M-mode): 2.5 cm  LEFT ATRIUM             Index        RIGHT ATRIUM           Index LA diam:        3.70 cm 1.92 cm/m   RA Area:     13.70 cm LA Vol (A2C):   51.5 ml 26.67 ml/m  RA Volume:   33.00 ml  17.09 ml/m LA Vol (A4C):   54.1 ml 28.02 ml/m LA Biplane Vol: 54.4 ml 28.17 ml/m AORTIC VALVE LVOT Vmax:   95.40 cm/s LVOT Vmean:  60.900 cm/s LVOT VTI:    0.200 m  AORTA Ao Root diam: 3.00 cm Ao Asc diam:  3.40 cm  MITRAL VALVE MV Area (PHT): 4.80 cm     SHUNTS MV Decel Time: 158 msec     Systemic VTI:  0.20 m MR Peak grad: 103.6 mmHg    Systemic Diam: 2.10 cm MR Mean grad: 70.0 mmHg MR Vmax:      509.00 cm/s MR Vmean:     400.0 cm/s MV E velocity:  101.00 cm/s MV A velocity: 82.50 cm/s MV E/A ratio:  1.22  Fransico Him MD Electronically signed by Fransico Him MD Signature Date/Time: 04/29/2021/4:46:45 PM    Final    MONITORS  LONG TERM MONITOR (3-14  DAYS) 11/18/2021  Narrative   Basic underlying rhythm is normal sinus rhythm with average heart rate 73 bpm.  (Heart rate range 50 to to 207 bpm)   4 nonsustained runs of ventricular tachycardia with longest lasting 12 seconds at a rate of 130 bpm.  Fastest at 207 bpm.   For supraventricular tachycardia runs with longest lasting 13.6 seconds at 117 bpm.  Fastest SVT 174 bpm.   Low PVC burden.   Patch Wear Time:  14 days and 0 hours (2023-07-23T08:44:57-0400 to 2023-08-06T08:45:04-398)  Patient had a min HR of 52 bpm, max HR of 207 bpm, and avg HR of 73 bpm. Predominant underlying rhythm was Sinus Rhythm. Bundle Branch Block/IVCD was present. 4 Ventricular Tachycardia runs occurred, the run with the fastest interval lasting 4 beats with a max rate of 207 bpm, the longest lasting 11.5 secs with an avg rate of 130 bpm. 4 Supraventricular Tachycardia runs occurred, the run with the fastest interval lasting 7 beats with a max rate of 174 bpm, the longest lasting 13.6 secs with an avg rate of 117 bpm. Idioventricular Rhythm was present. Isolated SVEs were rare (<1.0%), SVE Couplets were rare (<1.0%), and SVE Triplets were rare (<1.0%). Isolated VEs were rare (<1.0%, 254), VE Couplets were rare (<1.0%, 2), and VE Triplets were rare (<1.0%, 1). Inverted QRS complexes possibly due to inverted placement of device.           Assessment & Plan    1. Coronary artery disease: - s/p NSTEMI 04/2021 which revealed multivessel CAD with moderate LAD, LCx, and pOM1 with evidence of severe stenosis in distal OM1/bifurcation which were medically managed.  He was found to have 99% distal RCA stenosis which was managed with PCI/DES. -Patient denies any chest pain or shortness of breath today -Continue   Brilinta 90 mg twice daily atorvastatin 80 mg, and Toprol-XL 75 mg daily.    2. Hypertension: -Patient's blood pressure today was controlled at 138/72 -Continue Toprol-XL 75 mg daily and amlodipine 10 mg daily -He was advised to continue to check blood pressures at home and contact the office if blood pressures are elevated.   3.  Bilateral lower extremity swelling: -He is euvolemic on exam today with trace lower extremity edema noted. -He is doing a good job with abstaining from excess salt in his diet. -He was encouraged to continue his Lasix 20 mg daily, with potassium 10 mg daily -He was also encouraged to elevate his extremities and use compression stockings -Low sodium diet, fluid restriction <2L, and daily weights encouraged. Educated to contact our office for weight gain of 2 lbs overnight or 5 lbs in one week.    4.   Hyperlipidemia: -Last LDL was was 91 above goal of less than 70 Continue statin therapy as noted above   5.  DM type II: -Patient's last hemoglobin A1c was 6.0 -Continue metformin 1000 mg twice daily and glipizide 5 mg daily -We will add Jardiance 10 mg daily for renal protection    6.  CKD stage IIIa: -Most recent creatinine was 1.69 on 10/2021 -Patient is currently followed by nephrology -Jardiance 10 mg added as noted above   7.  History of CVA: -2009 and 01/2019 L paramedian pontine infarct secondary to small vessel disease and L para median pons. -Continue aspirin and Lipitor 80 mg daily -Carotid ultrasounds completed with no changes to previous studies -Patient encouraged to reach out to PCP regarding occupational and physical therapy to assist with mobility  Disposition: Follow-up  with None or APP in 6 months    Medication Adjustments/Labs and Tests Ordered: Current medicines are reviewed at length with the patient today.  Concerns regarding medicines are outlined above.   Signed, Mable Fill, Marissa Nestle, NP 06/27/2022, 10:32 AM [facility]  Note:  This document was prepared using Dragon voice recognition software and may include unintentional dictation errors.

## 2022-06-27 ENCOUNTER — Encounter: Payer: Self-pay | Admitting: Nurse Practitioner

## 2022-06-27 ENCOUNTER — Telehealth: Payer: Self-pay | Admitting: *Deleted

## 2022-06-27 ENCOUNTER — Ambulatory Visit: Payer: No Typology Code available for payment source | Attending: Nurse Practitioner | Admitting: Nurse Practitioner

## 2022-06-27 VITALS — BP 138/72 | HR 62 | Ht 67.5 in | Wt 183.2 lb

## 2022-06-27 DIAGNOSIS — I10 Essential (primary) hypertension: Secondary | ICD-10-CM

## 2022-06-27 DIAGNOSIS — N1831 Chronic kidney disease, stage 3a: Secondary | ICD-10-CM | POA: Diagnosis not present

## 2022-06-27 DIAGNOSIS — E119 Type 2 diabetes mellitus without complications: Secondary | ICD-10-CM | POA: Diagnosis not present

## 2022-06-27 DIAGNOSIS — I2510 Atherosclerotic heart disease of native coronary artery without angina pectoris: Secondary | ICD-10-CM | POA: Diagnosis not present

## 2022-06-27 DIAGNOSIS — E785 Hyperlipidemia, unspecified: Secondary | ICD-10-CM

## 2022-06-27 NOTE — Patient Instructions (Signed)
Medication Instructions:  Your physician recommends that you continue on your current medications as directed. Please refer to the Current Medication list given to you today. *If you need a refill on your cardiac medications before your next appointment, please call your pharmacy*   Lab Work: NONE ORDERED   Testing/Procedures: NONE ORDERED   Follow-Up: At [facility], you and your health needs are our priority.  As part of our continuing mission to provide you with exceptional heart care, we have created designated Provider Care Teams.  These Care Teams include your primary Cardiologist (physician) and Advanced Practice Providers (APPs -  Physician Assistants and Nurse Practitioners) who all work together to provide you with the care you need, when you need it.  We recommend signing up for the patient portal called "MyChart".  Sign up information is provided on this After Visit Summary.  MyChart is used to connect with patients for Virtual Visits (Telemedicine).  Patients are able to view lab/test results, encounter notes, upcoming appointments, etc.  Non-urgent messages can be sent to your provider as well.   To learn more about what you can do with MyChart, go to [email or web address].    Your next appointment:   6 month(s)  Provider:   Lenna Sciara, MD   Other Instructions

## 2022-06-27 NOTE — Telephone Encounter (Signed)
   Primary Cardiologist: None  Chart reviewed as part of pre-operative protocol coverage. Simple dental extractions (1-2 teeth) are considered low risk procedures per guidelines and generally do not require any specific cardiac clearance. It is also generally accepted that for simple extractions and dental cleanings, there is no need to interrupt blood thinner therapy.   SBE prophylaxis is not required for the patient.  I will route this recommendation to the requesting party via Epic fax function and remove from pre-op pool.  Please call with questions.  Emmaline Life, NP-C  06/27/2022, 1:16 PM [address], [address] Office [phone] Fax [phone]'

## 2022-06-27 NOTE — Telephone Encounter (Signed)
   Pre-operative Risk Assessment    Patient Name: Jerry Moran  DOB: Jun 01, 1955 MRN: [id number]     Request for Surgical Clearance    Procedure:  Dental Extraction - Amount of Teeth to be Pulled:  CLEARANCE DIDN'T STATE, HAVE LEFT THEM A MESSAGE TO CALL BACK.  Date of Surgery:  Clearance TBD                                 Surgeon:  DR. Martinique THOMAS Surgeon's Group or Practice Name:  [facility] Phone number:  [phone] Fax number:  [phone]   Type of Clearance Requested:   - Medical  - Pharmacy:  Hold Ticagrelor (Brilinta) NOT INDICATED HOW LONG   Type of Anesthesia:  Local    Additional requests/questions:   PER CLEARANCE REQUEST, THEY WANT A COMPLETE PROBLEM LIST AND A LIST OF ALL MEDICATIONS ALONG WITH ANY CONCERNS  Signed, Jeanann Lewandowsky   06/27/2022, 11:32 AM

## 2022-06-27 NOTE — Telephone Encounter (Signed)
I s/w the [facility] office and they were able to clarify the pt is having only 1 tooth surgically extracted, under Local

## 2022-08-01 ENCOUNTER — Other Ambulatory Visit (HOSPITAL_COMMUNITY): Payer: Self-pay | Admitting: Internal Medicine

## 2022-08-01 ENCOUNTER — Encounter: Payer: Self-pay | Admitting: Internal Medicine

## 2022-08-01 DIAGNOSIS — R0989 Other specified symptoms and signs involving the circulatory and respiratory systems: Secondary | ICD-10-CM

## 2022-08-07 ENCOUNTER — Ambulatory Visit (HOSPITAL_COMMUNITY)
Admission: RE | Admit: 2022-08-07 | Discharge: 2022-08-07 | Disposition: A | Discharge status: Home / Self Care | Payer: Medicare (Managed Care) | Source: Ambulatory Visit | Attending: Internal Medicine | Admitting: Internal Medicine

## 2022-08-07 DIAGNOSIS — R0989 Other specified symptoms and signs involving the circulatory and respiratory systems: Secondary | ICD-10-CM

## 2022-08-07 LAB — VAS US ABI WITH/WO TBI
Left ABI: 0.78
Right ABI: 0.74

## 2022-08-07 NOTE — Progress Notes (Signed)
VASCULAR LAB    ABI  has been performed.  See CV proc for preliminary results.   Khya Halls, RVT 08/07/2022, 10:13 AM

## 2022-12-05 ENCOUNTER — Telehealth: Payer: Self-pay | Admitting: Cardiovascular Disease

## 2022-12-05 MED ORDER — EMPAGLIFLOZIN 10 MG PO TABS: 10.0000 mg | ORAL_TABLET | Freq: Every day | ORAL | 1 refills | Status: AC

## 2022-12-05 NOTE — Telephone Encounter (Signed)
*  STAT* If patient is at the pharmacy, call can be transferred to refill team.   1. Which medications need to be refilled? (please list name of each medication and dose if known)   empagliflozin (JARDIANCE) 10 MG TABS tablet   Take 1 tablet (10 mg total) by mouth daily before breakfast.    2. Would you like to learn more about the convenience, safety, & potential cost savings by using the [facility]? No   3. Are you open to using the [facility] No   4. Which pharmacy/location (including street and city if local pharmacy) is medication to be sent to? [facility] - [place], [place] - [address]     5. Do they need a 30 day or 90 day supply? 90 Day Supply

## 2022-12-05 NOTE — Telephone Encounter (Signed)
Pt's medication was sent to pt's pharmacy as requested. Confirmation received.  °

## 2022-12-14 NOTE — Progress Notes (Signed)
Cardiology Office Note:   Date:  12/22/2022  ID:  Jerry Moran, DOB December 15, 1955, MRN [id number] PCP:  [facility]  [facility] Providers Cardiologist:  Alverda Skeans, MD Referring MD: [facility]   Chief Complaint/Reason for Referral: Cardiology follow-up ASSESSMENT:    1. Coronary artery disease involving native coronary artery of native heart, unspecified whether angina present   2. Type 2 diabetes mellitus with hemoglobin A1c goal of less than 7.0% (HCC)   3. Hypertension associated with diabetes (HCC)   4. Hyperlipidemia associated with type 2 diabetes mellitus (HCC)   5. Stage 3b chronic kidney disease (HCC)   6. BMI 27.0-27.9,adult     PLAN:   In order of problems listed above: 1.  Coronary artery disease: Continue indefinite Brilinta monotherapy.  Will stop beta-blocker given preserved ejection fraction (REDUCE-AMI trial). 2.  Type 2 diabetes: Continue Brilinta instead of aspirin, Lipitor, Jardiance, and start losartan 25 mg daily.  Check CMP next week. 3.  Hypertension: Add losartan 25 mg and check labs next week with nurse blood pressure check.  If not at goal of less than 130/80 would increase losartan and repeat blood work. 4.  Hyperlipidemia: Goal LDL is less than 55 given history of acute coronary syndrome and stroke.  He is currently only on Zetia.  Will check lipid panel in the next week.  I imagine we will have to start Crestor 20 mg after that.  He was intolerant of atorvastatin due to brain fog. 5.  Stage III chronic kidney disease: Continue Jardiance and start losartan 5 mg for renal protection. 6.  Elevated BMI: Given history of diabetes will refer to pharmacy for recommendations regarding GLP-1 receptor agonist therapy to reduce risk of future myocardial infarction.       Dispo:  Return in about 3 months (around 03/23/2023).    Medication Adjustments/Labs and Tests Ordered: Current medicines are reviewed at length with the patient  today.  Concerns regarding medicines are outlined above.  The following changes have been made:     Labs/tests ordered: Orders Placed This Encounter  Procedures   Basic metabolic panel   Lipid panel   AMB Referral to Heartcare Pharm-D    Medication Changes: Meds ordered this encounter  Medications   metoprolol succinate (TOPROL-XL) 50 MG 24 hr tablet    Sig: Take 1.5 tablets (75 mg total) by mouth daily.    Dispense:  135 tablet    Refill:  3   losartan (COZAAR) 25 MG tablet    Sig: Take 1 tablet (25 mg total) by mouth daily.    Dispense:  90 tablet    Refill:  3    Current medicines are reviewed at length with the patient today.  The patient does not have concerns regarding medicines.  History of Present Illness:   FOCUSED PROBLEM LIST:   Acute coronary syndrome status post PCI of right coronary artery with moderate residual LAD, left circumflex, and obtuse marginal disease January 2023 Type 2 diabetes not on insulin Hyperlipidemia: Intolerant of atorvastatin due to brain fog Hypertension BMI of 27 CKD stage III History of stroke in 2009 and 2020 (due to small vessel disease)  The patient is a 67 y.o. male with the indicated medical history here for routine cardiology follow-up.  He has been followed by Dr. Verdis Prime for his cardiology care until recently.  He was last seen in our clinic in March of this year.  At that time he was doing well.  He  specifically denied any cardiovascular complaints.  In the interim since he was seen last his atorvastatin was stopped by his [place] provider due to brain fog.  He was started on Zetia 10 mg.  He is also stopped taking his Lasix.  He denies any increasing shortness of breath or peripheral edema.  He denies any exertional angina.  He has had no recurrent signs or symptoms of stroke.  He has been checking his blood pressures at home and unfortunately they remain elevated in the 140s to 160s.  He has been taking his medications faithfully  without any missed doses.  He fortunately has not required any emergency room visits or hospitalizations recently.  He was last seen in the emergency department in February due to low blood pressure      Current Medications: Current Meds  Medication Sig   amLODipine (NORVASC) 10 MG tablet Take 1 tablet (10 mg total) by mouth daily.   ascorbic acid (VITAMIN C) 250 MG tablet 250 mg 4 (four) times a week.   atorvastatin (LIPITOR) 80 MG tablet Take 1 tablet (80 mg total) by mouth at bedtime.   blood glucose meter kit and supplies KIT Dispense based on patient and insurance preference. Use up to four times daily as directed. (FOR ICD-9 250.00, 250.01).   chlorthalidone (HYGROTON) 25 MG tablet Take 12.5 mg by mouth daily.   Cholecalciferol (VITAMIN D3) 125 MCG (5000 UT) CAPS Take 1 capsule by mouth daily.   empagliflozin (JARDIANCE) 10 MG TABS tablet Take 1 tablet (10 mg total) by mouth daily before breakfast.   ezetimibe (ZETIA) 10 MG tablet Take 10 mg by mouth daily.   ferrous sulfate 325 (65 FE) MG tablet Take 325 mg by mouth See admin instructions. Take 1 capsule by mouth on Monday, Wednesday and Fridays   furosemide (LASIX) 20 MG tablet Take 20 mg by mouth daily as needed for edema.   glipiZIDE (GLUCOTROL) 5 MG tablet Take 1 tablet (5 mg total) by mouth daily.   losartan (COZAAR) 25 MG tablet Take 1 tablet (25 mg total) by mouth daily.   nitroGLYCERIN (NITROSTAT) 0.4 MG SL tablet Place 1 tablet (0.4 mg total) under the tongue every 5 (five) minutes as needed for chest pain.   oxybutynin (DITROPAN) 5 MG tablet Take 2.5 mg by mouth 2 (two) times daily.   tamsulosin (FLOMAX) 0.4 MG CAPS capsule Take 0.4 mg by mouth daily.   ticagrelor (BRILINTA) 90 MG TABS tablet Take 90 mg by mouth 2 (two) times daily. Pt takes 1 tablet BID.   [DISCONTINUED] metoprolol succinate (TOPROL-XL) 50 MG 24 hr tablet Take 75 mg by mouth daily. Pt takes 1&1/2 tablets once a day.     Allergies:    Patient has no known  allergies.   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    All other systems  reviewed and are negative.     EKGs/Labs/Other Test Reviewed:   EKG: EKG performed February 2024 that I personally reviewed demonstrates sinus rhythm and a right bundle branch block  EKG Interpretation Date/Time:    Ventricular Rate:    PR Interval:    QRS Duration:    QT Interval:    QTC Calculation:   R Axis:      Text Interpretation:          Prior CV studies reviewed: Cardiac Studies & Procedures   CARDIAC CATHETERIZATION  CARDIAC CATHETERIZATION 04/29/2021  Narrative   Dist RCA lesion is 99% stenosed.   1st Mrg-1 lesion is 60% stenosed.   Prox Cx to Mid Cx lesion is 40% stenosed.   1st Mrg-2 lesion is 80% stenosed.   Lat 1st Mrg lesion is 80% stenosed.   Prox LAD to Mid LAD lesion is 50% stenosed.   A drug-eluting stent was successfully placed using a STENT ONYX FRONTIER 2.5X12.   Post intervention, there is a 0% residual stenosis.  The LAD is a large caliber vessel that courses to the apex. The mid LAD has moderate stenosis. RFR 0.92 suggesting this lesion is not flow limiting. The Circumflex is a large vessel. The first obtuse marginal branch is a large bifurcating vessel. The proximal segment of the obtuse marginal branch has a moderate stenosis. The vessel then bifurcates. There is diffuse severe disease in both distal sub-branches of the obtuse marginal branch. The RCA is a large dominant vessel with  severe stenosis just before the bifurcation. This is felt to be the culprit lesion. Successful PTCA/DES x 1 distal RCA Mild elevation LV filling pressures  Recommendations: DAPT with ASA and Brilinta for one year. High intensity statin. Beta blocker as tolerated. Echo is pending. Recheck renal function tomorrow. Medical management of residual CAD in the Circumflex artery. Case reviewed with IC team.  Findings Coronary Findings Diagnostic  Dominance: Right  Left Anterior Descending Prox LAD to Mid LAD lesion is 50% stenosed.  Left Circumflex Vessel is large. Prox Cx to Mid Cx lesion is 40% stenosed.  First Obtuse Marginal Branch Vessel is large in size. 1st Mrg-1 lesion is 60% stenosed. 1st Mrg-2 lesion is 80% stenosed.  Lateral First Obtuse Marginal Branch Lat 1st Mrg lesion is 80% stenosed.  Right Coronary Artery Vessel is large. Dist RCA lesion is 99% stenosed.  Intervention  Dist RCA lesion Stent CATH LAUNCHER 6FR AL.75 guide catheter was inserted. Lesion crossed with guidewire using a WIRE COUGAR XT STRL 190CM. Pre-stent angioplasty was performed using a BALLN SAPPHIRE 2.0X12. A drug-eluting stent was successfully placed using a STENT ONYX FRONTIER 2.5X12. Stent strut is well apposed. Post-stent angioplasty was performed using a BALLN [place] EUPHORA RX 2.75X8. Post-Intervention Lesion Assessment The intervention was successful. Pre-interventional TIMI flow is 3. Post-intervention TIMI flow is 3. No complications occurred at this lesion. There is a 0% residual stenosis post intervention.     ECHOCARDIOGRAM  ECHOCARDIOGRAM COMPLETE 04/29/2021  Narrative ECHOCARDIOGRAM REPORT    Patient Name:   Jerry Moran Date of Exam: 04/29/2021 Medical Rec #:  [id number]         Height:       68.0 in Accession #:    [id number]        Weight:       175.0 lb Date of Birth:  [birth year]         BSA:          1.931 m Patient Age:  65 years          BP:           176/88  mmHg Patient Gender: M                 HR:           74 bpm. Exam Location:  Inpatient  Procedure: 2D Echo and Strain Analysis  Indications:    acute myocardial infarction  History:        Patient has prior history of Echocardiogram examinations, most recent 01/14/2019. Risk Factors:Dyslipidemia.  Sonographer:    Delcie Roch RDCS Referring Phys: [id number] DENNIS NARCISSE JR  IMPRESSIONS   1. Left ventricular ejection fraction, by estimation, is 60 to 65%. The left ventricle has normal function. The left ventricle has no regional wall motion abnormalities. Left ventricular diastolic parameters are indeterminate. Elevated left ventricular end-diastolic pressure. 2. Right ventricular systolic function is normal. The right ventricular size is normal. Tricuspid regurgitation signal is inadequate for assessing PA pressure. 3. The mitral valve is normal in structure. Mild mitral valve regurgitation. No evidence of mitral stenosis. 4. The aortic valve is normal in structure. Aortic valve regurgitation is trivial. No aortic stenosis is present. 5. The inferior vena cava is normal in size with greater than 50% respiratory variability, suggesting right atrial pressure of 3 mmHg.  FINDINGS Left Ventricle: Left ventricular ejection fraction, by estimation, is 60 to 65%. The left ventricle has normal function. The left ventricle has no regional wall motion abnormalities. Global longitudinal strain performed but not reported based on interpreter judgement due to suboptimal tracking. The left ventricular internal cavity size was normal in size. There is no left ventricular hypertrophy. Left ventricular diastolic parameters are indeterminate. Elevated left ventricular end-diastolic pressure.  Right Ventricle: The right ventricular size is normal. No increase in right ventricular wall thickness. Right ventricular systolic function is normal. Tricuspid regurgitation signal is inadequate for assessing  PA pressure.  Left Atrium: Left atrial size was normal in size.  Right Atrium: Right atrial size was normal in size.  Pericardium: There is no evidence of pericardial effusion.  Mitral Valve: The mitral valve is normal in structure. Mild mitral valve regurgitation. No evidence of mitral valve stenosis.  Tricuspid Valve: The tricuspid valve is normal in structure. Tricuspid valve regurgitation is trivial. No evidence of tricuspid stenosis.  Aortic Valve: The aortic valve is normal in structure. Aortic valve regurgitation is trivial. No aortic stenosis is present.  Pulmonic Valve: The pulmonic valve was normal in structure. Pulmonic valve regurgitation is trivial. No evidence of pulmonic stenosis.  Aorta: The aortic root is normal in size and structure.  Venous: The inferior vena cava is normal in size with greater than 50% respiratory variability, suggesting right atrial pressure of 3 mmHg.  IAS/Shunts: No atrial level shunt detected by color flow Doppler.   LEFT VENTRICLE PLAX 2D LVIDd:         5.00 cm   Diastology LVIDs:         3.40 cm   LV e' medial:    5.77 cm/s LV PW:         1.30 cm   LV E/e' medial:  17.5 LV IVS:        1.00 cm   LV e' lateral:   5.66 cm/s LVOT diam:     2.10 cm   LV E/e' lateral: 17.8 LV SV:         69 LV SV Index:   36 LVOT  Area:     3.46 cm   RIGHT VENTRICLE             IVC RV S prime:     11.50 cm/s  IVC diam: 1.80 cm TAPSE (M-mode): 2.5 cm  LEFT ATRIUM             Index        RIGHT ATRIUM           Index LA diam:        3.70 cm 1.92 cm/m   RA Area:     13.70 cm LA Vol (A2C):   51.5 ml 26.67 ml/m  RA Volume:   33.00 ml  17.09 ml/m LA Vol (A4C):   54.1 ml 28.02 ml/m LA Biplane Vol: 54.4 ml 28.17 ml/m AORTIC VALVE LVOT Vmax:   95.40 cm/s LVOT Vmean:  60.900 cm/s LVOT VTI:    0.200 m  AORTA Ao Root diam: 3.00 cm Ao Asc diam:  3.40 cm  MITRAL VALVE MV Area (PHT): 4.80 cm     SHUNTS MV Decel Time: 158 msec     Systemic VTI:  0.20  m MR Peak grad: 103.6 mmHg    Systemic Diam: 2.10 cm MR Mean grad: 70.0 mmHg MR Vmax:      509.00 cm/s MR Vmean:     400.0 cm/s MV E velocity: 101.00 cm/s MV A velocity: 82.50 cm/s MV E/A ratio:  1.22  Armanda Magic MD Electronically signed by Armanda Magic MD Signature Date/Time: 04/29/2021/4:46:45 PM    Final    MONITORS  LONG TERM MONITOR (3-14 DAYS) 11/11/2021  Narrative   Basic underlying rhythm is normal sinus rhythm with average heart rate 73 bpm.  (Heart rate range 50 to to 207 bpm)   4 nonsustained runs of ventricular tachycardia with longest lasting 12 seconds at a rate of 130 bpm.  Fastest at 207 bpm.   For supraventricular tachycardia runs with longest lasting 13.6 seconds at 117 bpm.  Fastest SVT 174 bpm.   Low PVC burden.   Patch Wear Time:  14 days and 0 hours (2023-07-23T08:44:57-0400 to 2023-08-06T08:45:04-398)  Patient had a min HR of 52 bpm, max HR of 207 bpm, and avg HR of 73 bpm. Predominant underlying rhythm was Sinus Rhythm. Bundle Branch Block/IVCD was present. 4 Ventricular Tachycardia runs occurred, the run with the fastest interval lasting 4 beats with a max rate of 207 bpm, the longest lasting 11.5 secs with an avg rate of 130 bpm. 4 Supraventricular Tachycardia runs occurred, the run with the fastest interval lasting 7 beats with a max rate of 174 bpm, the longest lasting 13.6 secs with an avg rate of 117 bpm. Idioventricular Rhythm was present. Isolated SVEs were rare (<1.0%), SVE Couplets were rare (<1.0%), and SVE Triplets were rare (<1.0%). Isolated VEs were rare (<1.0%, 254), VE Couplets were rare (<1.0%, 2), and VE Triplets were rare (<1.0%, 1). Inverted QRS complexes possibly due to inverted placement of device.           Recent Labs: 05/07/2022: ALT 20; Hemoglobin 11.9; Platelets 189 05/12/2022: BUN 18; Creatinine, Ser 1.96; Potassium 3.8; Sodium 138   Lipid Panel    Component Value Date/Time   CHOL 148 04/29/2021 0213   TRIG 83 04/29/2021  0213   HDL 40 (L) 04/29/2021 0213   CHOLHDL 3.7 04/29/2021 0213   VLDL 17 04/29/2021 0213   LDLCALC 91 04/29/2021 0213    Risk Assessment/Calculations:  Physical Exam:   VS:  BP (!) 150/80   Pulse 63   Ht 5\' 8"  (1.727 m)   Wt 187 lb (84.8 kg)   SpO2 95%   BMI 28.43 kg/m    HYPERTENSION CONTROL Vitals:   12/22/22 1057 12/22/22 1125  BP: (!) 162/80 (!) 150/80    The patient's blood pressure is elevated above target today.  In order to address the patient's elevated BP: A new medication was prescribed today.      Wt Readings from Last 3 Encounters:  12/22/22 187 lb (84.8 kg)  06/27/22 183 lb 3.2 oz (83.1 kg)  04/28/22 183 lb (83 kg)      GENERAL:  No apparent distress, AOx3 HEENT:  No carotid bruits, +2 carotid impulses, no scleral icterus CAR: RRR no murmurs, gallops, rubs, or thrills RES:  Clear to auscultation bilaterally ABD:  Soft, nontender, nondistended, positive bowel sounds x 4 VASC:  +2 radial pulses, +2 carotid pulses NEURO:  CN 2-12 grossly intact; motor and sensory grossly intact PSYCH:  No active depression or anxiety EXT:  No edema, ecchymosis, or cyanosis  Signed, Orbie Pyo, MD  12/22/2022 11:35 AM    [facility] [address] [place], [place], [place]  [address] Phone: [phone]; Fax: [phone]   Note:  This document was prepared using Dragon voice recognition software and may include unintentional dictation errors.

## 2022-12-22 ENCOUNTER — Encounter: Payer: Self-pay | Admitting: Internal Medicine

## 2022-12-22 ENCOUNTER — Ambulatory Visit: Payer: No Typology Code available for payment source | Attending: Internal Medicine | Admitting: Internal Medicine

## 2022-12-22 VITALS — BP 150/80 | HR 63 | Ht 68.0 in | Wt 187.0 lb

## 2022-12-22 DIAGNOSIS — I152 Hypertension secondary to endocrine disorders: Secondary | ICD-10-CM

## 2022-12-22 DIAGNOSIS — E119 Type 2 diabetes mellitus without complications: Secondary | ICD-10-CM | POA: Diagnosis not present

## 2022-12-22 DIAGNOSIS — E785 Hyperlipidemia, unspecified: Secondary | ICD-10-CM

## 2022-12-22 DIAGNOSIS — I2510 Atherosclerotic heart disease of native coronary artery without angina pectoris: Secondary | ICD-10-CM

## 2022-12-22 DIAGNOSIS — E1159 Type 2 diabetes mellitus with other circulatory complications: Secondary | ICD-10-CM

## 2022-12-22 DIAGNOSIS — Z7984 Long term (current) use of oral hypoglycemic drugs: Secondary | ICD-10-CM

## 2022-12-22 DIAGNOSIS — E1169 Type 2 diabetes mellitus with other specified complication: Secondary | ICD-10-CM | POA: Diagnosis not present

## 2022-12-22 DIAGNOSIS — N1832 Chronic kidney disease, stage 3b: Secondary | ICD-10-CM

## 2022-12-22 DIAGNOSIS — Z6827 Body mass index (BMI) 27.0-27.9, adult: Secondary | ICD-10-CM

## 2022-12-22 MED ORDER — LOSARTAN POTASSIUM 25 MG PO TABS: 25.0000 mg | ORAL_TABLET | Freq: Every day | ORAL | 3 refills | Status: DC

## 2022-12-22 MED ORDER — METOPROLOL SUCCINATE ER 50 MG PO TB24: 75.0000 mg | ORAL_TABLET | Freq: Every day | ORAL | 3 refills | Status: DC

## 2022-12-22 NOTE — Addendum Note (Signed)
Addended by: Lendon Ka on: 12/22/2022 11:46 AM   Modules accepted: Orders

## 2022-12-22 NOTE — Patient Instructions (Signed)
Medication Instructions:  Your physician has recommended you make the following change in your medication:  1.) start losartan 25 mg - one tablet daily 2.) take Lasix 20 mg - one tablet daily as needed for swelling or shortness of breath  NURSE VISIT FOR BLOOD PRESSURE CHECK ON FRI 9/27 OR MON 9/30.  *If you need a refill on your cardiac medications before your next appointment, please call your pharmacy*   Lab Work: Please return for labs Fri 9/27 or Mon 9/30 (BMET, LIPIDS)  If you have labs (blood work) drawn today and your tests are completely normal, you will receive your results only by: MyChart Message (if you have MyChart) OR A paper copy in the mail If you have any lab test that is abnormal or we need to change your treatment, we will call you to review the results.   Testing/Procedures: none   Follow-Up: At [facility], you and your health needs are our priority.  As part of our continuing mission to provide you with exceptional heart care, we have created designated Provider Care Teams.  These Care Teams include your primary Cardiologist (physician) and Advanced Practice Providers (APPs -  Physician Assistants and Nurse Practitioners) who all work together to provide you with the care you need, when you need it.  Your next appointment:   3 month(s)  Provider:   Jari Favre, [name], Ronie Spies, [name], Robin Searing, NP, Jacolyn Reedy, [name], Eligha Bridegroom, NP, Tereso Newcomer, [name], or Perlie Gold, [name]      You have been referred to Clinical Pharmacy Team for elevated BMI.

## 2022-12-27 ENCOUNTER — Telehealth: Payer: Self-pay | Admitting: Internal Medicine

## 2022-12-27 NOTE — Telephone Encounter (Signed)
Nurses visit needs to be reschedule. Please advise

## 2023-01-01 ENCOUNTER — Ambulatory Visit: Payer: Medicare (Managed Care)

## 2023-01-02 ENCOUNTER — Ambulatory Visit: Payer: No Typology Code available for payment source

## 2023-01-04 ENCOUNTER — Ambulatory Visit: Payer: No Typology Code available for payment source | Attending: Cardiology | Admitting: Pharmacist

## 2023-01-04 DIAGNOSIS — E785 Hyperlipidemia, unspecified: Secondary | ICD-10-CM

## 2023-01-04 DIAGNOSIS — E119 Type 2 diabetes mellitus without complications: Secondary | ICD-10-CM | POA: Diagnosis not present

## 2023-01-04 DIAGNOSIS — I10 Essential (primary) hypertension: Secondary | ICD-10-CM

## 2023-01-04 NOTE — Progress Notes (Signed)
Patient ID: Jerry Moran                 DOB: [birth year]                    MRN: [id number]      HPI: Jerry Moran is a 67 y.o. male patient referred to pharmacy clinic by Dr. Lynnette Caffey to initiate weight loss therapy with GLP1-RA. PMH is significant for obesity complicated by chronic medical conditions including DM, CAD s/p ACS s/p PCI 1/23, HLD, HTN, CKD III, stroke in 2009 and 2020 due to small vessel disease. Most recent BMI 28.  Atorvastatin was stopped and patient placed on zetia due to brain fog with atorvastatin. [facility] Seen by Dr. Lynnette Caffey 9/20 and placed to losartan 25mg  due to elevated BP. He has CKD.   Looks like PCP at [facility] recommended Ozmepic at appointment 12/04/22. Last A1C in Aug was 9.6.   Patient presents today to clinic.  He is a former [employer] development officer.  Uses the [place] in [place].  Originally from [place].  His blood sugar is uncontrolled.  Unsure if he is on Jardiance.  Also on glipizide.  Walks with a cane for balance.  Recently started going back to the gym 3 days a week. Does the bike and weight machines.   We talked about the benefits of Ozempic on both his blood sugar and his heart and kidneys.  He does not remember discussion with PCP but states his wife is the boss of his medications.   Exercise: started going to the gym 3 days a week  Family History:  Family History  Problem Relation Age of Onset   Breast cancer Mother    Colon cancer Neg Hx    Pancreatic cancer Neg Hx    Prostate cancer Neg Hx     Social History: no tobacco, no alcohol  Labs: Lab Results  Component Value Date   HGBA1C 6.0 (H) 04/29/2021    Wt Readings from Last 1 Encounters:  12/22/22 187 lb (84.8 kg)    BP Readings from Last 1 Encounters:  12/22/22 (!) 150/80   Pulse Readings from Last 1 Encounters:  12/22/22 63       Component Value Date/Time   CHOL 148 04/29/2021 0213   TRIG 83 04/29/2021 0213   HDL 40 (L) 04/29/2021 0213   CHOLHDL 3.7  04/29/2021 0213   VLDL 17 04/29/2021 0213   LDLCALC 91 04/29/2021 0213    Past Medical History:  Diagnosis Date   Coronary artery disease    Diabetes mellitus without complication (HCC)    Hyperlipidemia    Hypertension    Prostate cancer (HCC)    Stroke (HCC)     Current Outpatient Medications on File Prior to Visit  Medication Sig Dispense Refill   amLODipine (NORVASC) 10 MG tablet Take 1 tablet (10 mg total) by mouth daily. 30 tablet 0   ascorbic acid (VITAMIN C) 250 MG tablet 250 mg 4 (four) times a week.     atorvastatin (LIPITOR) 80 MG tablet Take 1 tablet (80 mg total) by mouth at bedtime. 90 tablet 3   blood glucose meter kit and supplies KIT Dispense based on patient and insurance preference. Use up to four times daily as directed. (FOR ICD-9 250.00, 250.01). 1 each 0   chlorthalidone (HYGROTON) 25 MG tablet Take 12.5 mg by mouth daily.     Cholecalciferol (VITAMIN D3) 125 MCG (5000 UT) CAPS Take 1 capsule  by mouth daily.     empagliflozin (JARDIANCE) 10 MG TABS tablet Take 1 tablet (10 mg total) by mouth daily before breakfast. 90 tablet 1   ezetimibe (ZETIA) 10 MG tablet Take 10 mg by mouth daily.     ferrous sulfate 325 (65 FE) MG tablet Take 325 mg by mouth See admin instructions. Take 1 capsule by mouth on Monday, Wednesday and Fridays     furosemide (LASIX) 20 MG tablet Take 20 mg by mouth daily as needed for edema.     glipiZIDE (GLUCOTROL) 5 MG tablet Take 1 tablet (5 mg total) by mouth daily. 30 tablet 2   losartan (COZAAR) 25 MG tablet Take 1 tablet (25 mg total) by mouth daily. 90 tablet 3   metoprolol succinate (TOPROL-XL) 50 MG 24 hr tablet Take 1.5 tablets (75 mg total) by mouth daily. 135 tablet 3   nitroGLYCERIN (NITROSTAT) 0.4 MG SL tablet Place 1 tablet (0.4 mg total) under the tongue every 5 (five) minutes as needed for chest pain. 25 tablet 3   oxybutynin (DITROPAN) 5 MG tablet Take 2.5 mg by mouth 2 (two) times daily.     potassium chloride (KLOR-CON) 10  MEQ tablet TAKE 1 TABLET BY MOUTH EVERY DAY (Patient not taking: Reported on 12/22/2022) 90 tablet 3   tamsulosin (FLOMAX) 0.4 MG CAPS capsule Take 0.4 mg by mouth daily.     ticagrelor (BRILINTA) 90 MG TABS tablet Take 90 mg by mouth 2 (two) times daily. Pt takes 1 tablet BID.     No current facility-administered medications on file prior to visit.    No Known Allergies   Assessment/Plan:  1. Weight loss/diabetes-patient is overweight and has uncontrolled diabetes.  He would benefit from GLP-1.  Primary care doctor Dr. Willa Rough recommended semaglutide which I am in agreement with.  I imagine that this is their formulary option over [facility].  Confirmed patient has no personal or family history of medullary thyroid carcinoma (MTC) or Multiple Endocrine Neoplasia syndrome type 2 (MEN 2).  No history of gallstones or pancreatitis.  Advised patient on common side effects including nausea, diarrhea, dyspepsia, decreased appetite, and fatigue. Counseled patient on reducing meal size and how to titrate medication to minimize side effects. Counseled patient to call if intolerable side effects or if experiencing dehydration, abdominal pain, or dizziness. Patient will adhere to dietary modifications and will target at least 150 minutes of moderate intensity exercise weekly.   Since patient will need to get the medication from the [facility] for her to be affordable I will let primary care manage this medication.  Prescription will need to come from [place] provider in order for him to fill it at the [facility].  I have advised patient to tell Dr. Willa Rough that he would like to start this medication.  Handout information provided.  2. HLD/CAD-patient had brain fog on atorvastatin and was changed by PCP to Zetia.  Highly doubt this will be effective in controlling his cholesterol.  Will check direct LDL-C today.  3. HTN Losartan was added last visit with Dr. Lynnette Caffey 9/20.  BMP drawn today.   Thank you,  Olene Floss, Pharm.D,  BCACP, BCPS, CPP [facility] Division of [facility] [address], [place], [place] [address]  Phone: [phone]; Fax: [phone]

## 2023-01-04 NOTE — Patient Instructions (Addendum)
Please ask Dr. Willa Rough about Ozempic  GLP1 Agonist Titration Plan:  Will plan to follow the titration plan as below, pending patient is tolerating each dose before increasing to the next. Can slow titration if needed for tolerability.    Ozempic:  -Month 1: Inject Ozempic 0.25 mg SQ once weekly x 4 weeks -Month 2: Inject Ozempic 0.5 mg  SQ once weekly x 4 weeks -Month 3: Inject Ozempic 1 mg SQ once weekly x 4 weeks -Month 4+: Inject Ozempic 2 mg SQ once weekly  GLP-1 Receptor Agonist Counseling Points This medication reduces your appetite and may make you feel fuller longer.  Stop eating when your body tells you that you are full. This will likely happen sooner than you are used to. Fried/greasy food and sweets may upset your stomach - minimize these as much as possible. Store your medication in the fridge until you are ready to use it. Inject your medication in the fatty tissue of your lower abdominal area (2 inches away from belly button) or upper outer thigh. Rotate injection sites. Common side effects include: nausea, diarrhea/constipation, and heartburn, and are more likely to occur if you overeat. Stop your injection for 7 days prior to surgical procedures requiring anesthesia.  Dosing schedule:  We will touch base with you monthly over the phone. The medication can be increased in monthly intervals depending on tolerability and efficacy.  Tips for success: Write down the reasons why you want to lose weight and post it in a place where you'll see it often.  Start small and work your way up. Keep in mind that it takes time to achieve goals, and small steps add up.  Any additional movements help to burn calories. Taking the stairs rather than the elevator and parking at the far end of your parking lot are easy ways to start. Brisk walking for at least 30 minutes 4 or more days of the week is an excellent goal to work toward  Understanding what it means to feel full: Did you know that  it can take 15 minutes or more for your brain to receive the message that you've eaten? That means that, if you eat less food, but consume it slower, you may still feel satisfied.  Eating a lot of fruits and vegetables can also help you feel fuller.  Eat off of smaller plates so that moderate portions don't seem too small  Tips for living a healthier life     Building a Healthy and Balanced Diet Make most of your meal vegetables and fruits -  of your plate. Aim for color and variety, and remember that potatoes don't count as vegetables on the Healthy Eating Plate because of their negative impact on blood sugar.  Go for whole grains -  of your plate. Whole and intact grains--whole wheat, barley, wheat berries, quinoa, oats, brown rice, and foods made with them, such as whole wheat pasta--have a milder effect on blood sugar and insulin than white bread, white rice, and other refined grains.  Protein power -  of your plate. Fish, poultry, beans, and nuts are all healthy, versatile protein sources--they can be mixed into salads, and pair well with vegetables on a plate. Limit red meat, and avoid processed meats such as bacon and sausage.  Healthy plant oils - in moderation. Choose healthy vegetable oils like olive, canola, soy, corn, sunflower, peanut, and others, and avoid partially hydrogenated oils, which contain unhealthy trans fats. Remember that low-fat does not mean "healthy."  Drink water, coffee, or tea. Skip sugary drinks, limit milk and dairy products to one to two servings per day, and limit juice to a small glass per day.  Stay active. The red figure running across the Healthy Eating Plate's placemat is a reminder that staying active is also important in weight control.  The main message of the Healthy Eating Plate is to focus on diet quality:  The type of carbohydrate in the diet is more important than the amount of carbohydrate in the diet, because some sources of  carbohydrate--like vegetables (other than potatoes), fruits, whole grains, and beans--are healthier than others. The Healthy Eating Plate also advises consumers to avoid sugary beverages, a major source of calories--usually with little nutritional value--in the American diet. The Healthy Eating Plate encourages consumers to use healthy oils, and it does not set a maximum on the percentage of calories people should get each day from healthy sources of fat. In this way, the Healthy Eating Plate recommends the opposite of the low-fat message promoted for decades by the USDA.  [employer]  SUGAR  Sugar is a huge problem in the modern day diet. Sugar is a big contributor to heart disease, diabetes, high triglyceride levels, fatty liver disease and obesity. Sugar is hidden in almost all packaged foods/beverages. Added sugar is extra sugar that is added beyond what is naturally found and has no nutritional benefit for your body. The American Heart Association recommends limiting added sugars to no more than 25g for women and 36 grams for men per day. There are many names for sugar including maltose, sucrose (names ending in "ose"), high fructose corn syrup, molasses, cane sugar, corn sweetener, raw sugar, syrup, honey or fruit juice concentrate.   One of the best ways to limit your added sugars is to stop drinking sweetened beverages such as soda, sweet tea, and fruit juice.  There is 65g of added sugars in one 20oz bottle of Coke! That is equal to 7.5 donuts.   Pay attention and read all nutrition facts labels. Below is an examples of a nutrition facts label. The #1 is showing you the total sugars where the # 2 is showing you the added sugars. This one serving has almost the max amount of added sugars per day!   EXERCISE  Exercise is good. We've all heard that. In an ideal world, we would all have time and resources to get plenty of it. When you are  active, your heart pumps more efficiently and you will feel better.  Multiple studies show that even walking regularly has benefits that include living a longer life. The American Heart Association recommends 150 minutes per week of exercise (30 minutes per day most days of the week). You can do this in any increment you wish. Nine or more 10-minute walks count. So does an hour-long exercise class. Break the time apart into what will work in your life. Some of the best things you can do include walking briskly, jogging, cycling or swimming laps. Not everyone is ready to "exercise." Sometimes we need to start with just getting active. Here are some easy ways to be more active throughout the day:  Take the stairs instead of the elevator  Go for a 10-15 minute walk during your lunch break (find a friend to make it more enjoyable)  When shopping, park at the back of the parking lot  If you take public transportation, get off one stop early and walk the extra distance  Pace around while  making phone calls  Check with your doctor if you aren't sure what your limitations may be. Always remember to drink plenty of water when doing any type of exercise. Don't feel like a failure if you're not getting the 90-150 minutes per week. If you started by being a couch potato, then just a 10-minute walk each day is a huge improvement. Start with little victories and work your way up.   HEALTHY EATING TIPS              Plan ahead: make a menu of the meals for a week then create a grocery list to go with that menu. Consider meals that easily stretch into a night of leftovers, such as stews or casseroles. Or consider making two of your favorite meal and put one in the freezer for another night. Try a night or two each week that is "meatless" or "no cook" such as salads. When you get home from the grocery store wash and prepare your vegetables and fruits. Then when you need them they are ready to go.   Tips for going to the  grocery store:  Buy store or generic brands  Check the weekly ad from your store on-line or in their in-store flyer  Look at the unit price on the shelf tag to compare/contrast the costs of different items  Buy fruits/vegetables in season  Carrots, bananas and apples are low-cost, naturally healthy items  If meats or frozen vegetables are on sale, buy some extras and put in your freezer  Limit buying prepared or "ready to eat" items, even if they are pre-made salads or fruit snacks  Do not shop when you're hungry  Foods at eye level tend to be more expensive. Look on the high and low shelves for deals.  Consider shopping at the farmer's market for fresh foods in season.  Avoid the cookie and chip aisles (these are expensive, high in calories and low in nutritional value). Shop on the outside of the grocery store.  Healthy food preparations:  If you can't get lean hamburger, be sure to drain the fat when cooking  Steam, saut (in olive oil), grill or bake foods  Experiment with different seasonings to avoid adding salt to your foods. Kosher salt, sea salt and Himalayan salt are all still salt and should be avoided. Try seasoning food with onion, garlic, thyme, rosemary, basil ect. Onion powder or garlic powder is ok. Avoid if it says salt (ie garlic salt).

## 2023-01-05 ENCOUNTER — Telehealth: Payer: Self-pay | Admitting: Pharmacist

## 2023-01-05 LAB — BASIC METABOLIC PANEL
BUN/Creatinine Ratio: 10 (ref 10–24)
BUN: 22 mg/dL (ref 8–27)
CO2: 18 mmol/L — ABNORMAL LOW (ref 20–29)
Calcium: 9.6 mg/dL (ref 8.6–10.2)
Chloride: 103 mmol/L (ref 96–106)
Creatinine, Ser: 2.2 mg/dL — ABNORMAL HIGH (ref 0.76–1.27)
Glucose: 240 mg/dL — ABNORMAL HIGH (ref 70–99)
Potassium: 3.8 mmol/L (ref 3.5–5.2)
Sodium: 139 mmol/L (ref 134–144)
eGFR: 32 mL/min/{1.73_m2} — ABNORMAL LOW (ref >59)

## 2023-01-05 LAB — LDL CHOLESTEROL, DIRECT: LDL Direct: 168 mg/dL — ABNORMAL HIGH (ref 0–99)

## 2023-01-05 MED ORDER — ROSUVASTATIN CALCIUM 20 MG PO TABS: 20.0000 mg | ORAL_TABLET | Freq: Every day | ORAL | 3 refills | Status: DC

## 2023-01-05 MED ORDER — ROSUVASTATIN CALCIUM 10 MG PO TABS: 10.0000 mg | ORAL_TABLET | Freq: Every day | ORAL | 3 refills | Status: DC

## 2023-01-05 NOTE — Telephone Encounter (Signed)
Acceptable increase in scr with losartan start. LDL-C way above goal on just zetia. Will need to discuss trying rosuvastatin since he had brain fog with atorvastatin.  I called and spoke with patient wife and pt about labs. Wife thinks he is on 2 cholesterol medications. They were not home at the time, but was going to call me when they got home.  We also talked about his BP. He has the ability to check his BP at home but doesn't often. I offered apt with PharmD for follow. Will schedule when they call back.

## 2023-01-05 NOTE — Telephone Encounter (Signed)
Pt wife called back. He is only on zetia for cholesterol. Agreeable to trying rosuvastatin. Rx sent to [facility] for ro[facility]atin 10mg  daily. He does have medicare plan.   Apt for BP made for 10/7

## 2023-01-08 ENCOUNTER — Ambulatory Visit: Payer: Medicare (Managed Care)

## 2023-01-08 NOTE — Progress Notes (Deleted)
Patient ID: Jerry Moran                 DOB: 10-Aug-1955                      MRN: [id number]      HPI: Jerry Moran is a 67 y.o. male referred by Dr. Lynnette Caffey to HTN clinic. PMH is significant for DM, CAD s/p ACS s/p PCI 1/23, HLD, HTN, CKD III, stroke in 2009 and 2020 due to small vessel disease. Seen by PharmD on 10/3 to discuss ozempic for weight and DM control. Blood sugar not controlled, latest A1C 9.6. At appointment with Dr. Lynnette Caffey on 9/20, BP was elevated. Losartan 25mg  daily was started. Patient has CKD. Scr bumped an acceptable amount (<30%).  Ran out of time to discuss blood pressure and cholesterol. I spoke with patient and his wife on the phone 10/4. Direct LDL-C was above goal on just zetia. Brain fog with atorvastatin. Agreeable to try rosuvastatin. Patient uses the [place] in [place], but also has medicare.   Home readings? Dizziness, lightheadedness, headache, blurred vision, SOB, swelling   Current HTN meds: amlodipine 10mg  daily, chlorthalidone 12.5mg  daily, furosemide 20mg  daily as needed, losartan 25mg  daily, metoprolol succinate 75mg  daily.  Previously tried:  BP goal: <130/80  Family History:   Social History:   Diet:   Exercise:  {types:28256}  Home BP readings:  Date SBP/DBP  HR                              Average      Wt Readings from Last 3 Encounters:  12/22/22 187 lb (84.8 kg)  06/27/22 183 lb 3.2 oz (83.1 kg)  04/28/22 183 lb (83 kg)   BP Readings from Last 3 Encounters:  12/22/22 (!) 150/80  06/27/22 138/72  05/07/22 (!) 165/102   Pulse Readings from Last 3 Encounters:  12/22/22 63  06/27/22 62  05/07/22 64    Renal function: CrCl cannot be calculated (Unknown ideal weight.).  Past Medical History:  Diagnosis Date   Coronary artery disease    Diabetes mellitus without complication (HCC)    Hyperlipidemia    Hypertension    Prostate cancer (HCC)    Stroke [facility])     Current Outpatient Medications on File  Prior to Visit  Medication Sig Dispense Refill   amLODipine (NORVASC) 10 MG tablet Take 1 tablet (10 mg total) by mouth daily. 30 tablet 0   ascorbic acid (VITAMIN C) 250 MG tablet 250 mg 4 (four) times a week.     blood glucose meter kit and supplies KIT Dispense based on patient and insurance preference. Use up to four times daily as directed. (FOR ICD-9 250.00, 250.01). 1 each 0   chlorthalidone (HYGROTON) 25 MG tablet Take 12.5 mg by mouth daily.     Cholecalciferol (VITAMIN D3) 125 MCG (5000 UT) CAPS Take 1 capsule by mouth daily.     empagliflozin (JARDIANCE) 10 MG TABS tablet Take 1 tablet (10 mg total) by mouth daily before breakfast. 90 tablet 1   ezetimibe (ZETIA) 10 MG tablet Take 10 mg by mouth daily.     ferrous sulfate 325 (65 FE) MG tablet Take 325 mg by mouth See admin instructions. Take 1 capsule by mouth on Monday, Wednesday and Fridays     furosemide (LASIX) 20 MG tablet Take 20 mg by mouth daily as needed for edema.  glipiZIDE (GLUCOTROL) 5 MG tablet Take 1 tablet (5 mg total) by mouth daily. 30 tablet 2   losartan (COZAAR) 25 MG tablet Take 1 tablet (25 mg total) by mouth daily. 90 tablet 3   metoprolol succinate (TOPROL-XL) 50 MG 24 hr tablet Take 1.5 tablets (75 mg total) by mouth daily. 135 tablet 3   nitroGLYCERIN (NITROSTAT) 0.4 MG SL tablet Place 1 tablet (0.4 mg total) under the tongue every 5 (five) minutes as needed for chest pain. 25 tablet 3   oxybutynin (DITROPAN) 5 MG tablet Take 2.5 mg by mouth 2 (two) times daily.     potassium chloride (KLOR-CON) 10 MEQ tablet TAKE 1 TABLET BY MOUTH EVERY DAY (Patient not taking: Reported on 12/22/2022) 90 tablet 3   rosuvastatin (CRESTOR) 10 MG tablet Take 1 tablet (10 mg total) by mouth daily. 90 tablet 3   tamsulosin (FLOMAX) 0.4 MG CAPS capsule Take 0.4 mg by mouth daily.     ticagrelor (BRILINTA) 90 MG TABS tablet Take 90 mg by mouth 2 (two) times daily. Pt takes 1 tablet BID.     No current facility-administered  medications on file prior to visit.    No Known Allergies  There were no vitals taken for this visit.   Assessment/Plan: No BP recorded.  {Refresh Note OR Click here to enter BP  :1}***   1. Hypertension -  No problem-specific Assessment & Plan notes found for this encounter.      Thank you  Olene Floss, Pharm.D, BCPS, CPP [facility] Division of [facility] [address] [place], [place] [address]  Phone: [phone]; Fax: [phone]

## 2023-01-22 ENCOUNTER — Encounter (HOSPITAL_COMMUNITY): Payer: Self-pay | Admitting: Internal Medicine

## 2023-01-22 ENCOUNTER — Inpatient Hospital Stay (HOSPITAL_COMMUNITY)
Admission: EM | Admit: 2023-01-22 | Discharge: 2023-01-31 | DRG: 522 | Disposition: A | Discharge status: Skilled Nursing Facility | Payer: No Typology Code available for payment source | Attending: Internal Medicine | Admitting: Internal Medicine

## 2023-01-22 ENCOUNTER — Emergency Department (HOSPITAL_COMMUNITY): Payer: No Typology Code available for payment source

## 2023-01-22 ENCOUNTER — Other Ambulatory Visit: Payer: Self-pay

## 2023-01-22 DIAGNOSIS — Z955 Presence of coronary angioplasty implant and graft: Secondary | ICD-10-CM

## 2023-01-22 DIAGNOSIS — Z87891 Personal history of nicotine dependence: Secondary | ICD-10-CM

## 2023-01-22 DIAGNOSIS — Z01810 Encounter for preprocedural cardiovascular examination: Secondary | ICD-10-CM

## 2023-01-22 DIAGNOSIS — K5900 Constipation, unspecified: Secondary | ICD-10-CM | POA: Diagnosis present

## 2023-01-22 DIAGNOSIS — W010XXA Fall on same level from slipping, tripping and stumbling without subsequent striking against object, initial encounter: Secondary | ICD-10-CM | POA: Diagnosis present

## 2023-01-22 DIAGNOSIS — N1832 Chronic kidney disease, stage 3b: Secondary | ICD-10-CM

## 2023-01-22 DIAGNOSIS — Z7902 Long term (current) use of antithrombotics/antiplatelets: Secondary | ICD-10-CM

## 2023-01-22 DIAGNOSIS — Z803 Family history of malignant neoplasm of breast: Secondary | ICD-10-CM

## 2023-01-22 DIAGNOSIS — S72009A Fracture of unspecified part of neck of unspecified femur, initial encounter for closed fracture: Secondary | ICD-10-CM | POA: Diagnosis present

## 2023-01-22 DIAGNOSIS — Z7984 Long term (current) use of oral hypoglycemic drugs: Secondary | ICD-10-CM | POA: Diagnosis not present

## 2023-01-22 DIAGNOSIS — E1165 Type 2 diabetes mellitus with hyperglycemia: Secondary | ICD-10-CM | POA: Diagnosis present

## 2023-01-22 DIAGNOSIS — I129 Hypertensive chronic kidney disease with stage 1 through stage 4 chronic kidney disease, or unspecified chronic kidney disease: Secondary | ICD-10-CM | POA: Diagnosis present

## 2023-01-22 DIAGNOSIS — N400 Enlarged prostate without lower urinary tract symptoms: Secondary | ICD-10-CM | POA: Diagnosis present

## 2023-01-22 DIAGNOSIS — C61 Malignant neoplasm of prostate: Secondary | ICD-10-CM | POA: Diagnosis present

## 2023-01-22 DIAGNOSIS — E1122 Type 2 diabetes mellitus with diabetic chronic kidney disease: Secondary | ICD-10-CM | POA: Diagnosis present

## 2023-01-22 DIAGNOSIS — I252 Old myocardial infarction: Secondary | ICD-10-CM

## 2023-01-22 DIAGNOSIS — S72012A Unspecified intracapsular fracture of left femur, initial encounter for closed fracture: Principal | ICD-10-CM | POA: Diagnosis present

## 2023-01-22 DIAGNOSIS — I4510 Unspecified right bundle-branch block: Secondary | ICD-10-CM | POA: Diagnosis present

## 2023-01-22 DIAGNOSIS — Z794 Long term (current) use of insulin: Secondary | ICD-10-CM | POA: Diagnosis not present

## 2023-01-22 DIAGNOSIS — Z79899 Other long term (current) drug therapy: Secondary | ICD-10-CM

## 2023-01-22 DIAGNOSIS — Y92481 Parking lot as the place of occurrence of the external cause: Secondary | ICD-10-CM

## 2023-01-22 DIAGNOSIS — I10 Essential (primary) hypertension: Secondary | ICD-10-CM | POA: Diagnosis present

## 2023-01-22 DIAGNOSIS — I2510 Atherosclerotic heart disease of native coronary artery without angina pectoris: Secondary | ICD-10-CM | POA: Diagnosis present

## 2023-01-22 DIAGNOSIS — Z7951 Long term (current) use of inhaled steroids: Secondary | ICD-10-CM | POA: Diagnosis not present

## 2023-01-22 DIAGNOSIS — W19XXXA Unspecified fall, initial encounter: Secondary | ICD-10-CM

## 2023-01-22 DIAGNOSIS — E876 Hypokalemia: Secondary | ICD-10-CM | POA: Diagnosis present

## 2023-01-22 DIAGNOSIS — Y9301 Activity, walking, marching and hiking: Secondary | ICD-10-CM | POA: Diagnosis present

## 2023-01-22 DIAGNOSIS — E785 Hyperlipidemia, unspecified: Secondary | ICD-10-CM | POA: Diagnosis present

## 2023-01-22 DIAGNOSIS — I69354 Hemiplegia and hemiparesis following cerebral infarction affecting left non-dominant side: Secondary | ICD-10-CM | POA: Diagnosis not present

## 2023-01-22 DIAGNOSIS — E119 Type 2 diabetes mellitus without complications: Secondary | ICD-10-CM

## 2023-01-22 DIAGNOSIS — S72002A Fracture of unspecified part of neck of left femur, initial encounter for closed fracture: Principal | ICD-10-CM

## 2023-01-22 DIAGNOSIS — M25552 Pain in left hip: Secondary | ICD-10-CM | POA: Diagnosis present

## 2023-01-22 LAB — COMPREHENSIVE METABOLIC PANEL
ALT: 18 U/L (ref 0–44)
AST: 18 U/L (ref 15–41)
Albumin: 3.5 g/dL (ref 3.5–5.0)
Alkaline Phosphatase: 72 U/L (ref 38–126)
Anion gap: 10 (ref 5–15)
BUN: 22 mg/dL (ref 8–23)
CO2: 22 mmol/L (ref 22–32)
Calcium: 8.9 mg/dL (ref 8.9–10.3)
Chloride: 101 mmol/L (ref 98–111)
Creatinine, Ser: 2.42 mg/dL — ABNORMAL HIGH (ref 0.61–1.24)
GFR, Estimated: 28 mL/min — ABNORMAL LOW (ref >60)
Glucose, Bld: 453 mg/dL — ABNORMAL HIGH (ref 70–99)
Potassium: 3.6 mmol/L (ref 3.5–5.1)
Sodium: 133 mmol/L — ABNORMAL LOW (ref 135–145)
Total Bilirubin: 0.9 mg/dL (ref 0.3–1.2)
Total Protein: 7.1 g/dL (ref 6.5–8.1)

## 2023-01-22 LAB — HEMOGLOBIN A1C
Hgb A1c MFr Bld: 10.1 % — ABNORMAL HIGH (ref 4.8–5.6)
Mean Plasma Glucose: 243.17 mg/dL

## 2023-01-22 LAB — CBC WITH DIFFERENTIAL/PLATELET
Abs Immature Granulocytes: 0.02 10*3/uL (ref 0.00–0.07)
Basophils Absolute: 0 10*3/uL (ref 0.0–0.1)
Basophils Relative: 0 %
Eosinophils Absolute: 0.1 10*3/uL (ref 0.0–0.5)
Eosinophils Relative: 2 %
HCT: 37 % — ABNORMAL LOW (ref 39.0–52.0)
Hemoglobin: 12.7 g/dL — ABNORMAL LOW (ref 13.0–17.0)
Immature Granulocytes: 0 %
Lymphocytes Relative: 14 %
Lymphs Abs: 0.6 10*3/uL — ABNORMAL LOW (ref 0.7–4.0)
MCH: 30.6 pg (ref 26.0–34.0)
MCHC: 34.3 g/dL (ref 30.0–36.0)
MCV: 89.2 fL (ref 80.0–100.0)
Monocytes Absolute: 0.5 10*3/uL (ref 0.1–1.0)
Monocytes Relative: 11 %
Neutro Abs: 3.4 10*3/uL (ref 1.7–7.7)
Neutrophils Relative %: 73 %
Platelets: 190 10*3/uL (ref 150–400)
RBC: 4.15 MIL/uL — ABNORMAL LOW (ref 4.22–5.81)
RDW: 12.7 % (ref 11.5–15.5)
WBC: 4.7 10*3/uL (ref 4.0–10.5)
nRBC: 0 % (ref 0.0–0.2)

## 2023-01-22 LAB — GLUCOSE, CAPILLARY: Glucose-Capillary: 299 mg/dL — ABNORMAL HIGH (ref 70–99)

## 2023-01-22 MED ORDER — ATORVASTATIN CALCIUM 80 MG PO TABS: 80.0000 mg | ORAL_TABLET | Freq: Every day | ORAL | Status: DC

## 2023-01-22 MED ORDER — HYDRALAZINE HCL 20 MG/ML IJ SOLN
10.0000 mg | Freq: Four times a day (QID) | INTRAMUSCULAR | Status: DC | PRN
  Administered 2023-01-22: 10 mg via INTRAVENOUS
  Filled 2023-01-22: qty 1

## 2023-01-22 MED ORDER — AMLODIPINE BESYLATE 10 MG PO TABS
10.0000 mg | ORAL_TABLET | Freq: Every day | ORAL | Status: DC
  Administered 2023-01-22 – 2023-01-30 (×8): 10 mg via ORAL
  Filled 2023-01-22: qty 2
  Filled 2023-01-22 (×7): qty 1

## 2023-01-22 MED ORDER — TAMSULOSIN HCL 0.4 MG PO CAPS: 0.4000 mg | ORAL_CAPSULE | Freq: Every day | ORAL | Status: DC

## 2023-01-22 MED ORDER — INSULIN ASPART 100 UNIT/ML IJ SOLN
10.0000 [IU] | Freq: Once | INTRAMUSCULAR | Status: AC
  Administered 2023-01-22: 10 [IU] via SUBCUTANEOUS

## 2023-01-22 MED ORDER — OXYCODONE-ACETAMINOPHEN 5-325 MG PO TABS
1.0000 | ORAL_TABLET | Freq: Once | ORAL | Status: AC
  Administered 2023-01-22: 1 via ORAL
  Filled 2023-01-22: qty 1

## 2023-01-22 MED ORDER — METOPROLOL SUCCINATE ER 25 MG PO TB24
25.0000 mg | ORAL_TABLET | Freq: Every day | ORAL | Status: DC
  Administered 2023-01-22 – 2023-01-30 (×8): 25 mg via ORAL
  Filled 2023-01-22 (×9): qty 1

## 2023-01-22 MED ORDER — NITROGLYCERIN 0.4 MG SL SUBL: 0.4000 mg | SUBLINGUAL_TABLET | SUBLINGUAL | Status: DC | PRN

## 2023-01-22 MED ORDER — OXYBUTYNIN CHLORIDE 5 MG PO TABS
5.0000 mg | ORAL_TABLET | Freq: Every day | ORAL | Status: DC
  Administered 2023-01-22 – 2023-01-30 (×9): 5 mg via ORAL
  Filled 2023-01-22 (×9): qty 1

## 2023-01-22 MED ORDER — ALBUTEROL SULFATE (2.5 MG/3ML) 0.083% IN NEBU: 2.5000 mg | INHALATION_SOLUTION | RESPIRATORY_TRACT | Status: DC | PRN

## 2023-01-22 MED ORDER — POLYETHYLENE GLYCOL 3350 17 G PO PACK
17.0000 g | PACK | Freq: Every day | ORAL | Status: DC
  Administered 2023-01-25 – 2023-01-30 (×6): 17 g via ORAL
  Filled 2023-01-22 (×7): qty 1

## 2023-01-22 MED ORDER — MOMETASONE FURO-FORMOTEROL FUM 200-5 MCG/ACT IN AERO: 2.0000 | INHALATION_SPRAY | Freq: Two times a day (BID) | RESPIRATORY_TRACT | Status: DC

## 2023-01-22 MED ORDER — OXYBUTYNIN CHLORIDE 5 MG PO TABS: 7.5000 mg | ORAL_TABLET | ORAL | Status: DC

## 2023-01-22 MED ORDER — VITAMIN D 25 MCG (1000 UNIT) PO TABS
5000.0000 [IU] | ORAL_TABLET | Freq: Every day | ORAL | Status: DC
  Administered 2023-01-23 – 2023-01-30 (×7): 5000 [IU] via ORAL
  Filled 2023-01-22 (×7): qty 5

## 2023-01-22 MED ORDER — ROSUVASTATIN CALCIUM 10 MG PO TABS
10.0000 mg | ORAL_TABLET | Freq: Every day | ORAL | Status: DC
  Administered 2023-01-22 – 2023-01-30 (×9): 10 mg via ORAL
  Filled 2023-01-22 (×5): qty 1
  Filled 2023-01-22: qty 2
  Filled 2023-01-22 (×2): qty 1
  Filled 2023-01-22 (×2): qty 2

## 2023-01-22 MED ORDER — INSULIN ASPART 100 UNIT/ML IJ SOLN
0.0000 [IU] | Freq: Three times a day (TID) | INTRAMUSCULAR | Status: DC
  Administered 2023-01-23: 3 [IU] via SUBCUTANEOUS
  Administered 2023-01-23: 5 [IU] via SUBCUTANEOUS
  Administered 2023-01-24: 3 [IU] via SUBCUTANEOUS
  Administered 2023-01-24: 2 [IU] via SUBCUTANEOUS
  Administered 2023-01-25: 9 [IU] via SUBCUTANEOUS
  Administered 2023-01-25: 5 [IU] via SUBCUTANEOUS
  Administered 2023-01-25: 3 [IU] via SUBCUTANEOUS
  Administered 2023-01-26: 2 [IU] via SUBCUTANEOUS
  Administered 2023-01-26: 3 [IU] via SUBCUTANEOUS
  Administered 2023-01-26: 5 [IU] via SUBCUTANEOUS
  Administered 2023-01-27: 2 [IU] via SUBCUTANEOUS
  Administered 2023-01-27 (×2): 3 [IU] via SUBCUTANEOUS
  Administered 2023-01-28: 2 [IU] via SUBCUTANEOUS
  Administered 2023-01-28 – 2023-01-29 (×3): 3 [IU] via SUBCUTANEOUS
  Administered 2023-01-29: 5 [IU] via SUBCUTANEOUS
  Administered 2023-01-29: 3 [IU] via SUBCUTANEOUS
  Administered 2023-01-30: 2 [IU] via SUBCUTANEOUS
  Administered 2023-01-30 (×2): 3 [IU] via SUBCUTANEOUS

## 2023-01-22 MED ORDER — OXYBUTYNIN CHLORIDE 5 MG PO TABS
2.5000 mg | ORAL_TABLET | Freq: Every day | ORAL | Status: DC
  Administered 2023-01-23 – 2023-01-30 (×7): 2.5 mg via ORAL
  Filled 2023-01-22 (×7): qty 1

## 2023-01-22 MED ORDER — INSULIN ASPART 100 UNIT/ML IJ SOLN
0.0000 [IU] | Freq: Every day | INTRAMUSCULAR | Status: DC
  Administered 2023-01-22 – 2023-01-23 (×2): 3 [IU] via SUBCUTANEOUS
  Administered 2023-01-24: 5 [IU] via SUBCUTANEOUS
  Administered 2023-01-26: 3 [IU] via SUBCUTANEOUS
  Administered 2023-01-27: 4 [IU] via SUBCUTANEOUS
  Administered 2023-01-28 – 2023-01-30 (×2): 3 [IU] via SUBCUTANEOUS

## 2023-01-22 MED ORDER — EZETIMIBE 10 MG PO TABS
10.0000 mg | ORAL_TABLET | Freq: Every day | ORAL | Status: DC
  Administered 2023-01-23 – 2023-01-30 (×7): 10 mg via ORAL
  Filled 2023-01-22 (×7): qty 1

## 2023-01-22 NOTE — ED Provider Notes (Signed)
[facility] AT [facility] Provider Note   CSN: [id number] Arrival date & time: 01/22/23  1311     History  Chief Complaint  Patient presents with   Jerry Moran is a 67 y.o. male.   Fall  Patient presents after fall.  Medical history includes HTN, HLD, CVA, CAD, DM.  He is not on a blood thinner.  Patient reports mechanical fall due to loss of balance.  When he fell, he landed on pavement, and struck his left hip area.  He denies striking anything else.  He has since had pain in area of left hip.  He denies any other areas of discomfort.  He has not been able to bear weight on his left leg.  He denies any distal numbness or paresthesias.       Home Medications Prior to Admission medications   Medication Sig Start Date End Date Taking? Authorizing Provider  amLODipine (NORVASC) 10 MG tablet Take 1 tablet (10 mg total) by mouth daily. 01/16/19   Darlin Drop, DO  ascorbic acid (VITAMIN C) 250 MG tablet 250 mg 4 (four) times a week. 07/27/22   [provider]  blood glucose meter kit and supplies KIT Dispense based on patient and insurance preference. Use up to four times daily as directed. (FOR ICD-9 250.00, 250.01). 01/16/19   Darlin Drop, DO  chlorthalidone (HYGROTON) 25 MG tablet Take 12.5 mg by mouth daily. 10/25/22   [provider]  Cholecalciferol (VITAMIN D3) 125 MCG (5000 UT) CAPS Take 1 capsule by mouth daily.    [provider]  empagliflozin (JARDIANCE) 10 MG TABS tablet Take 1 tablet (10 mg total) by mouth daily before breakfast. 12/05/22   Gaston Islam., NP  ezetimibe (ZETIA) 10 MG tablet Take 10 mg by mouth daily. 07/27/22   [provider]  ferrous sulfate 325 (65 FE) MG tablet Take 325 mg by mouth See admin instructions. Take 1 capsule by mouth on Monday, Wednesday and Fridays    [provider]  furosemide (LASIX) 20 MG tablet Take 20 mg by mouth daily as needed for edema.  04/04/22   [provider]  glipiZIDE (GLUCOTROL) 5 MG tablet Take 1 tablet (5 mg total) by mouth daily. 01/26/22   Adron Bene, MD  losartan (COZAAR) 25 MG tablet Take 1 tablet (25 mg total) by mouth daily. 12/22/22   Orbie Pyo, MD  metoprolol succinate (TOPROL-XL) 50 MG 24 hr tablet Take 1.5 tablets (75 mg total) by mouth daily. 12/22/22   Orbie Pyo, MD  nitroGLYCERIN (NITROSTAT) 0.4 MG SL tablet Place 1 tablet (0.4 mg total) under the tongue every 5 (five) minutes as needed for chest pain. 04/30/21   [name], Ovidio Kin., [name]  oxybutynin (DITROPAN) 5 MG tablet Take 2.5 mg by mouth 2 (two) times daily.    [provider]  potassium chloride (KLOR-CON) 10 MEQ tablet TAKE 1 TABLET BY MOUTH EVERY DAY Patient not taking: Reported on 12/22/2022 05/08/22   Gaston Islam., NP  rosuvastatin (CRESTOR) 10 MG tablet Take 1 tablet (10 mg total) by mouth daily. 01/05/23 04/05/23  Orbie Pyo, MD  tamsulosin (FLOMAX) 0.4 MG CAPS capsule Take 0.4 mg by mouth daily.    [provider]  ticagrelor (BRILINTA) 90 MG TABS tablet Take 90 mg by mouth 2 (two) times daily. Pt takes 1 tablet BID. 05/24/22   [provider]  Allergies    Patient has no known allergies.    Review of Systems   Review of Systems  Musculoskeletal:  Positive for arthralgias.  All other systems reviewed and are negative.   Physical Exam Updated Vital Signs BP (!) 150/82 (BP Location: Left Arm)   Pulse 66   Temp 98.2 F (36.8 C) (Oral)   Resp 15   SpO2 100%  Physical Exam Vitals and nursing note reviewed.  Constitutional:      General: He is not in acute distress.    Appearance: Normal appearance. He is well-developed. He is not ill-appearing, toxic-appearing or diaphoretic.  HENT:     Head: Normocephalic and atraumatic.     Right Ear: External ear normal.     Left Ear: External ear normal.     Nose: Nose normal.     Mouth/Throat:     Mouth: Mucous membranes are moist.   Eyes:     Extraocular Movements: Extraocular movements intact.     Conjunctiva/sclera: Conjunctivae normal.  Cardiovascular:     Rate and Rhythm: Normal rate and regular rhythm.  Pulmonary:     Effort: Pulmonary effort is normal. No respiratory distress.  Abdominal:     General: There is no distension.     Palpations: Abdomen is soft.  Musculoskeletal:        General: Tenderness present. No swelling or deformity.     Cervical back: Normal range of motion and neck supple.  Skin:    General: Skin is warm and dry.     Coloration: Skin is not jaundiced or pale.  Neurological:     General: No focal deficit present.     Mental Status: He is alert and oriented to person, place, and time.     Cranial Nerves: No cranial nerve deficit.     Sensory: No sensory deficit.     Motor: No weakness.  Psychiatric:        Mood and Affect: Mood normal.        Behavior: Behavior normal.        Thought Content: Thought content normal.        Judgment: Judgment normal.     ED Results / Procedures / Treatments   Labs (all labs ordered are listed, but only abnormal results are displayed) Labs Reviewed  COMPREHENSIVE METABOLIC PANEL - Abnormal; Notable for the following components:      Result Value   Sodium 133 (*)    Glucose, Bld 453 (*)    Creatinine, Ser 2.42 (*)    GFR, Estimated 28 (*)    All other components within normal limits  CBC WITH DIFFERENTIAL/PLATELET - Abnormal; Notable for the following components:   RBC 4.15 (*)    Hemoglobin 12.7 (*)    HCT 37.0 (*)    Lymphs Abs 0.6 (*)    All other components within normal limits  HEMOGLOBIN A1C    EKG EKG Interpretation Date/Time:  Monday January 22 2023 13:26:09 EDT Ventricular Rate:  71 PR Interval:  148 QRS Duration:  126 QT Interval:  436 QTC Calculation: 473 R Axis:   -11  Text Interpretation: Normal sinus rhythm Right bundle branch block Confirmed by Gloris Manchester ([id number]) on 01/22/2023 3:02:24 PM  Radiology No results  found.  Procedures Procedures    Medications Ordered in ED Medications  insulin aspart (novoLOG) injection 10 Units (has no administration in time range)  insulin aspart (novoLOG) injection 0-9 Units (has no administration in time range)  insulin aspart (novoLOG)  injection 0-5 Units (has no administration in time range)  hydrALAZINE (APRESOLINE) injection 10 mg (has no administration in time range)  amLODipine (NORVASC) tablet 10 mg (has no administration in time range)  oxyCODONE-acetaminophen (PERCOCET/ROXICET) 5-325 MG per tablet 1 tablet (1 tablet Oral Given 01/22/23 1539)    ED Course/ Medical Decision Making/ A&P                                 Medical Decision Making Risk Prescription drug management. Decision regarding hospitalization.   This patient presents to the ED for concern of fall, this involves an extensive number of treatment options, and is a complaint that carries with it a high risk of complications and morbidity.  The differential diagnosis includes acute injuries   Co morbidities that complicate the patient evaluation  HTN, HLD, CVA, CAD, DM   Additional history obtained:  Additional history obtained from patient's wife External records from outside source obtained and reviewed including EMR   Lab Tests:  I Ordered, and personally interpreted labs.  The pertinent results include: Normal hemoglobin, no leukocytosis, baseline CKD, hyperglycemia without evidence of DKA   Imaging Studies ordered:  I ordered imaging studies including left hip x-ray, chest x-ray I independently visualized and interpreted imaging which showed concern for left femoral neck fracture I agree with the radiologist interpretation   Cardiac Monitoring: / EKG:  The patient was maintained on a cardiac monitor.  I personally viewed and interpreted the cardiac monitored which showed an underlying rhythm of: Sinus rhythm   Consultations Obtained:  I requested  consultation with the orthopedic surgery team, PA Tinnie Gens,  and discussed lab and imaging findings as well as pertinent plan - they recommend: Admission to medicine for planned operative repair tomorrow   Problem List / ED Course / Critical interventions / Medication management  Patient presenting for left hip pain following a mechanical fall.  This fall occurred 2 hours prior to arrival.  On arrival, patient is low and oriented.  He endorses ongoing pain in the area of left hip.  He denies any other areas of discomfort.  Distal extremity is neurovascularly intact.  Percocet was ordered for analgesia.  X-ray of left hip shows concern for left neck fracture.  I consulted orthopedic surgery who evaluated the patient.  They recommend medicine admission for planned operative repair tomorrow.  Callback from medicine pending at time of signout.  Care of patient was signed out to oncoming ED provider. I ordered medication including Percocet for analgesia; insulin for hyperglycemia Reevaluation of the patient after these medicines showed that the patient improved I have reviewed the patients home medicines and have made adjustments as needed   Social Determinants of Health:  Has access to outpatient care        Final Clinical Impression(s) / ED Diagnoses Final diagnoses:  Closed left hip fracture, initial encounter [facility])  Fall, initial encounter    Rx / DC Orders ED Discharge Orders     None         Gloris Manchester, MD 01/22/23 [phone]

## 2023-01-22 NOTE — ED Triage Notes (Signed)
Pt to ED via [facility] from parking lot outside of school. Pt states he was ambulating when he lost his balance and fell on left side / left hip in parking lot. Mechanical fall per pt. Pt c/o left hip pain. No obvious deformity, no shortening or rotation. Pt not on any blood thinners. Pt denies hitting head. No LOC.   EMS: 162/94 71 HR 20 RR 97% RA 477 CBG (pt states he has not taken any of his medications today)

## 2023-01-22 NOTE — ED Provider Triage Note (Signed)
Emergency Medicine Provider Triage Evaluation Note  Jerry Moran , a 67 y.o. male with a past medical history of hypertension, type 2 diabetes, CKD and CAD who presents to the ED via EMS after fall.  This patient was evaluated in triage.  He reports walking and losing his balance falling on his left side while walking through a parking lot today.  Now reports left hip pain.  No head trauma or loss of consciousness.  Review of Systems  Positive: Left hip pain Negative: Headache chest pain shortness of breath  Physical Exam  There were no vitals taken for this visit. Gen:   Awake, no distress   Resp:  Normal effort  MSK:   Left hip with anterolateral tenderness.  Limited strength testing secondary to pain.  No obvious deformity. Other:    Medical Decision Making  Medically screening exam initiated at 1:21 PM.  Appropriate orders placed.  Jerry Moran was informed that the remainder of the evaluation will be completed by another provider, this initial triage assessment does not replace that evaluation, and the importance of remaining in the ED until their evaluation is complete.  Considering extensive medical history will obtain basic laboratory workup and EKG along with a left pelvis/hip x-ray  Remainder of care per ED provider   Jerry Foots, DO 01/22/23 1331

## 2023-01-22 NOTE — Consult Note (Signed)
Reason for Consult:Left hip fx Referring Physician: Gloris Manchester Time called: 1526 Time at bedside: 1534   Jerry Moran is an 67 y.o. male.  HPI: Jerry Moran was in a parking lot and lost his balance. He fell and had immediate left hip pain and could not get up. He was brought to the ED where x-rays showed a left hip fx and orthopedic surgery was consulted. He lives at home with his wife and is retired.  Past Medical History:  Diagnosis Date   Coronary artery disease    Diabetes mellitus without complication (HCC)    Hyperlipidemia    Hypertension    Prostate cancer (HCC)    Stroke [facility])     Past Surgical History:  Procedure Laterality Date   CARDIAC CATHETERIZATION     CORONARY PRESSURE/FFR STUDY N/A 04/29/2021   Procedure: INTRAVASCULAR PRESSURE WIRE/FFR STUDY;  Surgeon: Kathleene Hazel, MD;  Location: [facility] INVASIVE CV LAB;  Service: Cardiovascular;  Laterality: N/A;   CORONARY STENT INTERVENTION N/A 04/29/2021   Procedure: CORONARY STENT INTERVENTION;  Surgeon: Kathleene Hazel, MD;  Location: [facility] INVASIVE CV LAB;  Service: Cardiovascular;  Laterality: N/A;   LEFT HEART CATH AND CORONARY ANGIOGRAPHY N/A 04/29/2021   Procedure: LEFT HEART CATH AND CORONARY ANGIOGRAPHY;  Surgeon: Kathleene Hazel, MD;  Location: [facility] INVASIVE CV LAB;  Service: Cardiovascular;  Laterality: N/A;   PROSTATE BIOPSY      Family History  Problem Relation Age of Onset   Breast cancer Mother    Colon cancer Neg Hx    Pancreatic cancer Neg Hx    Prostate cancer Neg Hx     Social History:  reports that he has quit smoking. His smoking use included cigarettes. He has never used smokeless tobacco. He reports that he does not currently use alcohol. He reports that he does not use drugs.  Allergies: No Known Allergies  Medications: I have reviewed the patient's current medications.  Results for orders placed or performed during the hospital encounter of 01/22/23 (from the past 48  hour(s))  Comprehensive metabolic panel     Status: Abnormal   Collection Time: 01/22/23  1:40 PM  Result Value Ref Range   Sodium 133 (L) 135 - 145 mmol/L   Potassium 3.6 3.5 - 5.1 mmol/L   Chloride 101 98 - 111 mmol/L   CO2 22 22 - 32 mmol/L   Glucose, Bld 453 (H) 70 - 99 mg/dL    Comment: Glucose reference range applies only to samples taken after fasting for at least 8 hours.   BUN 22 8 - 23 mg/dL   Creatinine, Ser 1.61 (H) 0.61 - 1.24 mg/dL   Calcium 8.9 8.9 - 09.6 mg/dL   Total Protein 7.1 6.5 - 8.1 g/dL   Albumin 3.5 3.5 - 5.0 g/dL   AST 18 15 - 41 U/L   ALT 18 0 - 44 U/L   Alkaline Phosphatase 72 38 - 126 U/L   Total Bilirubin 0.9 0.3 - 1.2 mg/dL   GFR, Estimated 28 (L) >60 mL/min    Comment: (NOTE) Calculated using the CKD-EPI Creatinine Equation (2021)    Anion gap 10 5 - 15    Comment: Performed at [facility], [address], [place], [place] [address]  CBC with Differential     Status: Abnormal   Collection Time: 01/22/23  1:40 PM  Result Value Ref Range   WBC 4.7 4.0 - 10.5 K/uL   RBC 4.15 (L) 4.22 - 5.81 MIL/uL  Hemoglobin 12.7 (L) 13.0 - 17.0 g/dL   HCT 02.7 (L) 25.3 - 66.4 %   MCV 89.2 80.0 - 100.0 fL   MCH 30.6 26.0 - 34.0 pg   MCHC 34.3 30.0 - 36.0 g/dL   RDW 40.3 47.4 - 25.9 %   Platelets 190 150 - 400 K/uL   nRBC 0.0 0.0 - 0.2 %   Neutrophils Relative % 73 %   Neutro Abs 3.4 1.7 - 7.7 K/uL   Lymphocytes Relative 14 %   Lymphs Abs 0.6 (L) 0.7 - 4.0 K/uL   Monocytes Relative 11 %   Monocytes Absolute 0.5 0.1 - 1.0 K/uL   Eosinophils Relative 2 %   Eosinophils Absolute 0.1 0.0 - 0.5 K/uL   Basophils Relative 0 %   Basophils Absolute 0.0 0.0 - 0.1 K/uL   Immature Granulocytes 0 %   Abs Immature Granulocytes 0.02 0.00 - 0.07 K/uL    Comment: Performed at [facility], [address], [place], [place] [address]    No results found.  Review of Systems  HENT:  Negative for ear discharge, ear pain, hearing loss and tinnitus.   Eyes:   Negative for photophobia and pain.  Respiratory:  Negative for cough and shortness of breath.   Cardiovascular:  Negative for chest pain.  Gastrointestinal:  Negative for abdominal pain, nausea and vomiting.  Genitourinary:  Negative for dysuria, flank pain, frequency and urgency.  Musculoskeletal:  Positive for arthralgias (Left hip). Negative for back pain, myalgias and neck pain.  Neurological:  Negative for dizziness and headaches.  Hematological:  Does not bruise/bleed easily.  Psychiatric/Behavioral:  The patient is not nervous/anxious.    Blood pressure (!) 150/82, pulse 66, temperature 98.2 F (36.8 C), temperature source Oral, resp. rate 15, SpO2 100%. Physical Exam Constitutional:      General: He is not in acute distress.    Appearance: He is well-developed. He is not diaphoretic.  HENT:     Head: Normocephalic and atraumatic.  Eyes:     General: No scleral icterus.       Right eye: No discharge.        Left eye: No discharge.     Conjunctiva/sclera: Conjunctivae normal.  Cardiovascular:     Rate and Rhythm: Normal rate and regular rhythm.  Pulmonary:     Effort: Pulmonary effort is normal. No respiratory distress.  Musculoskeletal:     Cervical back: Normal range of motion.     Comments: LLE No traumatic wounds, ecchymosis, or rash  Mod TTP  No knee or ankle effusion  Knee stable to varus/ valgus and anterior/posterior stress  Sens DPN, SPN, TN intact  Motor EHL, ext, flex, evers 5/5  DP 2+, PT 1+, No significant edema  Skin:    General: Skin is warm and dry.  Neurological:     Mental Status: He is alert.  Psychiatric:        Mood and Affect: Mood normal.        Behavior: Behavior normal.     Assessment/Plan: Left hip fx -- Plan THA, possibly tomorrow with Dr. Blanchie Dessert. Please keep NPO after MN. I did warn him we may not be able to complete surgery until Wed or Thurs.    Freeman Caldron, [name] Orthopedic Surgery [phone] 01/22/2023, 3:40 PM
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
## 2023-01-22 NOTE — H&P (Signed)
History and Physical    Patient: Jerry Moran ZOX:[id number] DOB: [birth year] DOA: 01/22/2023 DOS: the patient was seen and examined on 01/22/2023 PCP: [facility]  Patient coming from: Home  Chief Complaint:  Chief Complaint  Patient presents with   Fall   HPI: Jerry Moran is a 67 y.o. male with medical history significant of CAD, NSTEMI 01/2022 SP DES to RCA residual multivessel CAD and Moderate LAD , LCx and posterior OM1, CVA, DM type 2, CKD stage IIIb, cr baseline 2.0--2.4 who presents after a mechanical fall and was found to have left hip fracture.  Patient related that he was in his usual state of health when he lost balance and fell in the parking lot.  He denies chest pain, shortness of breath, lightheadedness prior to or after the episodes.  Larey Seat on his left side and he experienced severe left hip pain.  He denies chest pain or shortness of breath on exertion.  [facility].  Denied any chest pain or shortness of breath during that time.  He does report some shortness of breath when he bends forward.  He is compliant with his medications.  He reports history of constipation.  Last bowel movement day prior to admission  Evaluation in the ED: Systolic blood pressure found to be elevated 184/91, creatinine 2.4, glucose 453, sodium 133, hemoglobin 12, white blood cell 4.7, chest x-ray: Elevation of the left hemidiaphragm.  Left hip x-ray; Subcapital left femoral neck fracture. This could be confirmed with CT scan.   Orthopedic was consulted, they are planning on performing surgery 10/20-second    Review of Systems: As mentioned in the history of present illness. All other systems reviewed and are negative. Past Medical History:  Diagnosis Date   Coronary artery disease    Diabetes mellitus without complication (HCC)    Hyperlipidemia    Hypertension    Prostate cancer (HCC)    Stroke [facility])     Past Surgical History:  Procedure Laterality Date   CARDIAC CATHETERIZATION     CORONARY PRESSURE/FFR STUDY N/A 04/29/2021   Procedure: INTRAVASCULAR PRESSURE WIRE/FFR STUDY;  Surgeon: Kathleene Hazel, MD;  [facility];  Service: Cardiovascular;  Laterality: N/A;   CORONARY STENT INTERVENTION N/A 04/29/2021   Procedure: CORONARY STENT INTERVENTION;  Surgeon: Kathleene Hazel, MD;  [facility]: [facility];  Service: Cardiovascular;  Laterality: N/A;   LEFT HEART CATH AND CORONARY ANGIOGRAPHY N/A 04/29/2021   Procedure: LEFT HEART CATH AND CORONARY ANGIOGRAPHY;  Surgeon: Kathleene Hazel, MD;  [facility]: [facility];  Service: Cardiovascular;  Laterality: N/A;   PROSTATE BIOPSY     Social History:  reports that he has quit smoking. His smoking use included cigarettes. He has never used smokeless tobacco. He reports that he does not currently use alcohol. He reports that he does not use drugs.  No Known Allergies  Family History  Problem Relation Age of Onset   Breast cancer Mother    Colon cancer Neg Hx    Pancreatic cancer Neg Hx    Prostate cancer Neg Hx     Prior to Admission medications   Medication Sig Start Date End Date Taking? Authorizing Provider  amLODipine (NORVASC) 10 MG tablet Take 1 tablet (10 mg total) by mouth daily. 01/16/19   Darlin Drop, DO  ascorbic acid (VITAMIN C) 250 MG tablet 250 mg 4 (four) times a week. 07/27/22  [provider]  blood glucose meter kit and supplies KIT Dispense based on patient and insurance preference. Use up to four times daily as directed. (FOR ICD-9 250.00, 250.01). 01/16/19   Darlin Drop, DO  chlorthalidone (HYGROTON) 25 MG tablet Take 12.5 mg by mouth daily. 10/25/22   [provider]  Cholecalciferol (VITAMIN D3) 125 MCG (5000 UT) CAPS Take 1 capsule by mouth daily.    [provider]  empagliflozin (JARDIANCE) 10 MG TABS tablet Take 1 tablet (10 mg  total) by mouth daily before breakfast. 12/05/22   Gaston Islam., NP  ezetimibe (ZETIA) 10 MG tablet Take 10 mg by mouth daily. 07/27/22   [provider]  ferrous sulfate 325 (65 FE) MG tablet Take 325 mg by mouth See admin instructions. Take 1 capsule by mouth on Monday, Wednesday and Fridays    [provider]  furosemide (LASIX) 20 MG tablet Take 20 mg by mouth daily as needed for edema. 04/04/22   [provider]  glipiZIDE (GLUCOTROL) 5 MG tablet Take 1 tablet (5 mg total) by mouth daily. 01/26/22   Adron Bene, MD  losartan (COZAAR) 25 MG tablet Take 1 tablet (25 mg total) by mouth daily. 12/22/22   Orbie Pyo, MD  metoprolol succinate (TOPROL-XL) 50 MG 24 hr tablet Take 1.5 tablets (75 mg total) by mouth daily. 12/22/22   Orbie Pyo, MD  nitroGLYCERIN (NITROSTAT) 0.4 MG SL tablet Place 1 tablet (0.4 mg total) under the tongue every 5 (five) minutes as needed for chest pain. 04/30/21   [name], Ovidio Kin., [name]  oxybutynin (DITROPAN) 5 MG tablet Take 2.5 mg by mouth 2 (two) times daily.    [provider]  potassium chloride (KLOR-CON) 10 MEQ tablet TAKE 1 TABLET BY MOUTH EVERY DAY Patient not taking: Reported on 12/22/2022 05/08/22   Gaston Islam., NP  rosuvastatin (CRESTOR) 10 MG tablet Take 1 tablet (10 mg total) by mouth daily. 01/05/23 04/05/23  Orbie Pyo, MD  tamsulosin (FLOMAX) 0.4 MG CAPS capsule Take 0.4 mg by mouth daily.    [provider]  ticagrelor (BRILINTA) 90 MG TABS tablet Take 90 mg by mouth 2 (two) times daily. Pt takes 1 tablet BID. 05/24/22   [provider]    Physical Exam: Vitals:   01/22/23 1341 01/22/23 1545  BP: (!) 150/82 (!) 184/91  Pulse: 66 78  Resp: 15 16  Temp: 98.2 F (36.8 C)   TempSrc: Oral   SpO2: 100% 98%   General; NAD CVS; S 1, S 2 RRR Lungs CTA Abdomen; BS present, soft nt, obese.  Extremities; left LE shorter. No edema Neuro; alert conversant,. Has some mild left  upper extremity weakness post stroke.  Data Reviewed:  Labs reviewed  Assessment and Plan: No notes have been filed under this hospital service. Service: Hospitalist  1-Left Hip Fracture;  Presents after mechanical fall, lost balance.  He uses cane for ambulation.  NPO after midnight for possible sx.  Cardiology consulted., patient had N-STEMI 01/2022. S/P DES RCA, residual diseases CAD. Cardiology was recommending lifelong Brillinta.  Chales Abrahams Risk 1.2 % of MI, cardiac arrest intraoperatively or 30 days post sx.  Cardiology consult for pre op eval.  Dilaudid/Oxycodone for pain management. Avoid Morphine due to CKD>   2-HTN, Uncontrolled.  -He has not taken his meds today.  -PRN IV Hydralazine order.  -Start Norvasc 10 mg -Hold chlorthalidone and Cozaar  preop, also patient will be n.p.o. to  avoid worsening kidney function -Resume metoprolol although previous cardiology note said to discontinue it.  Patient has been taking metoprolol will continue.  3-Diabetes type 2 uncontrolled, hyperglycemia Presented with a CBG 400 range. Will hold glipizide and Jardiance for now. Sliding scale insulin  4-Hyperlipidemia: Continue with [place] and Lipitor  5-CAD status post DES stent RCA 2023 He was on Brilinta twice daily.  Will hold for now and await cardiology recommendation Continue metoprolol, Zetia, Lipitor, as needed nitroglycerin  6-History of prostate cancer -BPH Continue Flomax Continue oxybutynin  7-CKD stage III B;  Cr baseline 2.0--2.4 Monitor.  Hold Chlorthalidone and ARB per op/   Constipation; Bowel regimen.    Advance Care Planning:   Code Status: Full Code full code discussed with patient, wife waas at bedside.   Consults: Orthopedic, cardiology.   Family Communication: Wife at Bedside.   Severity of Illness: Inpatient.   Author: Alba Cory, MD 01/22/2023 5:08 PM  For on call review [email or web address].

## 2023-01-23 ENCOUNTER — Encounter (HOSPITAL_COMMUNITY)
Admission: EM | Disposition: A | Discharge status: Skilled Nursing Facility | Payer: Self-pay | Source: Home / Self Care | Attending: Internal Medicine

## 2023-01-23 ENCOUNTER — Inpatient Hospital Stay (HOSPITAL_COMMUNITY): Payer: No Typology Code available for payment source

## 2023-01-23 DIAGNOSIS — S72002A Fracture of unspecified part of neck of left femur, initial encounter for closed fracture: Secondary | ICD-10-CM | POA: Diagnosis not present

## 2023-01-23 LAB — GLUCOSE, CAPILLARY
Glucose-Capillary: 251 mg/dL — ABNORMAL HIGH (ref 70–99)
Glucose-Capillary: 234 mg/dL — ABNORMAL HIGH (ref 70–99)
Glucose-Capillary: 266 mg/dL — ABNORMAL HIGH (ref 70–99)
Glucose-Capillary: 251 mg/dL — ABNORMAL HIGH (ref 70–99)

## 2023-01-23 LAB — CBC
HCT: 34.2 % — ABNORMAL LOW (ref 39.0–52.0)
Hemoglobin: 12 g/dL — ABNORMAL LOW (ref 13.0–17.0)
MCH: 30.8 pg (ref 26.0–34.0)
MCHC: 35.1 g/dL (ref 30.0–36.0)
MCV: 87.7 fL (ref 80.0–100.0)
Platelets: 180 10*3/uL (ref 150–400)
RBC: 3.9 MIL/uL — ABNORMAL LOW (ref 4.22–5.81)
RDW: 12.8 % (ref 11.5–15.5)
WBC: 7.7 10*3/uL (ref 4.0–10.5)
nRBC: 0 % (ref 0.0–0.2)

## 2023-01-23 LAB — COMPREHENSIVE METABOLIC PANEL
ALT: 14 U/L (ref 0–44)
AST: 13 U/L — ABNORMAL LOW (ref 15–41)
Albumin: 2.9 g/dL — ABNORMAL LOW (ref 3.5–5.0)
Alkaline Phosphatase: 56 U/L (ref 38–126)
Anion gap: 13 (ref 5–15)
BUN: 20 mg/dL (ref 8–23)
CO2: 22 mmol/L (ref 22–32)
Calcium: 9.5 mg/dL (ref 8.9–10.3)
Chloride: 101 mmol/L (ref 98–111)
Creatinine, Ser: 2.41 mg/dL — ABNORMAL HIGH (ref 0.61–1.24)
GFR, Estimated: 28 mL/min — ABNORMAL LOW (ref >60)
Glucose, Bld: 254 mg/dL — ABNORMAL HIGH (ref 70–99)
Potassium: 3.3 mmol/L — ABNORMAL LOW (ref 3.5–5.1)
Sodium: 136 mmol/L (ref 135–145)
Total Bilirubin: 1.2 mg/dL (ref 0.3–1.2)
Total Protein: 6.5 g/dL (ref 6.5–8.1)

## 2023-01-23 LAB — HIV ANTIBODY (ROUTINE TESTING W REFLEX): HIV Screen 4th Generation wRfx: NONREACTIVE

## 2023-01-23 LAB — SURGICAL PCR SCREEN
MRSA, PCR: NEGATIVE
Staphylococcus aureus: POSITIVE — AB

## 2023-01-23 SURGERY — ARTHROPLASTY, HIP, TOTAL,POSTERIOR APPROACH
Anesthesia: General | Site: Hip | Laterality: Left

## 2023-01-23 MED ORDER — ACETAMINOPHEN 650 MG RE SUPP: 650.0000 mg | Freq: Four times a day (QID) | RECTAL | Status: DC | PRN

## 2023-01-23 MED ORDER — HYDROMORPHONE HCL 1 MG/ML IJ SOLN
0.5000 mg | INTRAMUSCULAR | Status: DC | PRN
  Administered 2023-01-23: 0.5 mg via INTRAVENOUS
  Administered 2023-01-25 – 2023-01-27 (×5): 1 mg via INTRAVENOUS
  Filled 2023-01-23 (×6): qty 1

## 2023-01-23 MED ORDER — SORBITOL 70 % SOLN: 30.0000 mL | Freq: Every day | Status: DC | PRN

## 2023-01-23 MED ORDER — MUPIROCIN 2 % EX OINT
1.0000 | TOPICAL_OINTMENT | Freq: Two times a day (BID) | CUTANEOUS | Status: AC
  Administered 2023-01-23 – 2023-01-28 (×9): 1 via NASAL
  Filled 2023-01-23 (×2): qty 22

## 2023-01-23 MED ORDER — ONDANSETRON HCL 4 MG/2ML IJ SOLN
4.0000 mg | Freq: Four times a day (QID) | INTRAMUSCULAR | Status: DC | PRN
  Administered 2023-01-24: 4 mg via INTRAVENOUS

## 2023-01-23 MED ORDER — CHLORHEXIDINE GLUCONATE CLOTH 2 % EX PADS
6.0000 | MEDICATED_PAD | Freq: Every day | CUTANEOUS | Status: AC
  Administered 2023-01-24 – 2023-01-27 (×3): 6 via TOPICAL

## 2023-01-23 MED ORDER — ACETAMINOPHEN 325 MG PO TABS
650.0000 mg | ORAL_TABLET | Freq: Four times a day (QID) | ORAL | Status: DC | PRN
  Administered 2023-01-23 – 2023-01-31 (×8): 650 mg via ORAL
  Filled 2023-01-23 (×9): qty 2

## 2023-01-23 MED ORDER — SODIUM CHLORIDE 0.9% FLUSH
3.0000 mL | Freq: Two times a day (BID) | INTRAVENOUS | Status: DC
  Administered 2023-01-23 – 2023-01-30 (×15): 3 mL via INTRAVENOUS

## 2023-01-23 MED ORDER — SODIUM CHLORIDE 0.9% FLUSH: 3.0000 mL | INTRAVENOUS | Status: DC | PRN

## 2023-01-23 MED ORDER — HYDROCODONE-ACETAMINOPHEN 5-325 MG PO TABS: 1.0000 | ORAL_TABLET | ORAL | Status: DC | PRN

## 2023-01-23 MED ORDER — SENNA 8.6 MG PO TABS
1.0000 | ORAL_TABLET | Freq: Two times a day (BID) | ORAL | Status: DC
  Administered 2023-01-23 – 2023-01-30 (×15): 8.6 mg via ORAL
  Filled 2023-01-23 (×15): qty 1

## 2023-01-23 MED ORDER — ONDANSETRON HCL 4 MG PO TABS: 4.0000 mg | ORAL_TABLET | Freq: Four times a day (QID) | ORAL | Status: DC | PRN

## 2023-01-23 MED ORDER — DOCUSATE SODIUM 100 MG PO CAPS
100.0000 mg | ORAL_CAPSULE | Freq: Two times a day (BID) | ORAL | Status: DC
Start: 2023-01-23 — End: 2023-01-31
  Administered 2023-01-23 – 2023-01-30 (×15): 100 mg via ORAL
  Filled 2023-01-23 (×15): qty 1

## 2023-01-23 NOTE — Progress Notes (Signed)
     Subjective:  Patient reports pain as mild morning.  Lying comfortably in bed.  Denies distal numbness and tingling.  Localizes pain only to the left hip and groin area.  Denies pain in joints or extremities.  Objective:   VITALS:   Vitals:   01/22/23 1830 01/22/23 2026 01/22/23 2102 01/22/23 2330  BP: (!) 179/84 (!) 171/80 (!) 163/76 (!) 157/84  Pulse: 86 91 96 96  Resp: 19     Temp:  98.5 F (36.9 C)    TempSrc:  Oral Oral   SpO2: 98% 99% 96%     Sensation intact distally Intact pulses distally Dorsiflexion/Plantar flexion intact No cellulitis present Compartment soft    Lab Results  Component Value Date   WBC 4.7 01/22/2023   HGB 12.7 (L) 01/22/2023   HCT 37.0 (L) 01/22/2023   MCV 89.2 01/22/2023   PLT 190 01/22/2023   BMET    Component Value Date/Time   NA 133 (L) 01/22/2023 1340   NA 139 01/04/2023 1615   K 3.6 01/22/2023 1340   CL 101 01/22/2023 1340   CO2 22 01/22/2023 1340   GLUCOSE 453 (H) 01/22/2023 1340   BUN 22 01/22/2023 1340   BUN 22 01/04/2023 1615   CREATININE 2.42 (H) 01/22/2023 1340   CALCIUM 8.9 01/22/2023 1340   EGFR 32 (L) 01/04/2023 1615   GFRNONAA 28 (L) 01/22/2023 1340      Xray: Left nondisplaced femoral neck fracture  Assessment/Plan:     Principal Problem:   Hip fracture (HCC) Active Problems:   Type 2 diabetes mellitus with hemoglobin A1c goal of less than 7.0% (HCC)   Essential hypertension   Malignant neoplasm of prostate (HCC)   Hyperlipidemia with target LDL less than 70   Coronary artery disease   CKD stage 3b, GFR 30-44 ml/min (HCC)   Hip fracture, left, closed, initial encounter (HCC)  Discussed with the patient that she has a minimally displaced left femoral neck fx. discussed that the alignment is difficult to fully assess on the x-rays will recommend a CT scan to further evaluate.  If fracture is truly nondisplaced would be amenable to fixation with cannulated screws.  However, if the fracture is  displaced it is considered unstable and would benefit from total hip arthroplasty.  CT scan ordered this morning will follow-up.  Risk and benefits of surgery were discussed with the patient.   Unfortunately limited OR availability here at [facility] today.  Will touch base with the OR early afternoon to see if there will be space to do the surgery here later today, otherwise OR space is available at [facility] long tomorrow.  Please keep n.p.o. for now.      Arris Meyn A Azaryah Oleksy 01/23/2023, 7:13 AM   Weber Cooks, MD  Contact information:   [phone] 7am-5pm epic message Dr. Blanchie Dessert, or call office for patient follow up: [phone] After hours and holidays please check [email or web address] for group call information for [facility]

## 2023-01-23 NOTE — Inpatient Diabetes Management (Signed)
Inpatient Diabetes Program Recommendations  AACE/ADA: New Consensus Statement on Inpatient Glycemic Control (2015)  Target Ranges:  Prepandial:   less than 140 mg/dL      Peak postprandial:   less than 180 mg/dL (1-2 hours)      Critically ill patients:  140 - 180 mg/dL   Lab Results  Component Value Date   GLUCAP 266 (H) 01/23/2023   HGBA1C 10.1 (H) 01/22/2023    Review of Glycemic Control  Diabetes history: DM2 Outpatient Diabetes medications: Jardiance 10 mg daily, glipizide 5 mg daily Current orders for Inpatient glycemic control: Novolog 0-9 units TID and 0-5 HS  HgbA1C - 10.1%  Inpatient Diabetes Program Recommendations:    Consider adding Semglee 10 units every day  Consider adding Novolog 3 units TID with meals if eating > 50%.  Spoke with pt at bedside regarding his diabetes control and HgbA1C of 10.1% (average blood sugar of 243 mg/dL.)  Pt states he has meter although does not check blood sugars very often. Tries to eat healthy and likes to exercise. States he knows he should check blood sugars more often. Discussed  importance of checking CBGs and maintaining good CBG control to prevent long-term and short-term complications. Explained how hyperglycemia leads to damage within blood vessels which lead to the common complications seen with uncontrolled diabetes. Stressed to the patient the importance of improving glycemic control to prevent further complications from uncontrolled diabetes. Discussed impact of nutrition, exercise, stress, sickness, and medications on diabetes control. Pt states he will f/u with PCP at [facility] after discharge.  Appreciative of visit.   Continue to follow.  Thank you. Ailene Ards, RD, LDN, CDCES Inpatient Diabetes Coordinator [phone]

## 2023-01-23 NOTE — Progress Notes (Signed)
Pt to be transferred to [facility] Room 1442 via [facility]. Report given to Marisue Ivan, Charity fundraiser. All belongings sent with the pt. Dilaudid 0.5 mg IV given prior to transport. Pt in no apparent distress.
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
## 2023-01-24 ENCOUNTER — Inpatient Hospital Stay (HOSPITAL_COMMUNITY): Payer: No Typology Code available for payment source | Admitting: Certified Registered Nurse Anesthetist

## 2023-01-24 ENCOUNTER — Inpatient Hospital Stay (HOSPITAL_COMMUNITY)
Admission: RE | Admit: 2023-01-24 | Payer: Medicare (Managed Care) | Source: Home / Self Care | Admitting: Orthopedic Surgery

## 2023-01-24 ENCOUNTER — Encounter (HOSPITAL_COMMUNITY): Payer: Self-pay | Admitting: Internal Medicine

## 2023-01-24 ENCOUNTER — Inpatient Hospital Stay (HOSPITAL_COMMUNITY): Payer: No Typology Code available for payment source

## 2023-01-24 ENCOUNTER — Encounter (HOSPITAL_COMMUNITY)
Admission: EM | Disposition: A | Discharge status: Skilled Nursing Facility | Payer: Self-pay | Source: Home / Self Care | Attending: Internal Medicine

## 2023-01-24 ENCOUNTER — Other Ambulatory Visit: Payer: Self-pay

## 2023-01-24 DIAGNOSIS — I2510 Atherosclerotic heart disease of native coronary artery without angina pectoris: Secondary | ICD-10-CM

## 2023-01-24 DIAGNOSIS — S72002A Fracture of unspecified part of neck of left femur, initial encounter for closed fracture: Secondary | ICD-10-CM

## 2023-01-24 HISTORY — PX: TOTAL HIP ARTHROPLASTY: SHX124

## 2023-01-24 LAB — CBC
HCT: 36.3 % — ABNORMAL LOW (ref 39.0–52.0)
HCT: 37.9 % — ABNORMAL LOW (ref 39.0–52.0)
Hemoglobin: 12.3 g/dL — ABNORMAL LOW (ref 13.0–17.0)
Hemoglobin: 12.8 g/dL — ABNORMAL LOW (ref 13.0–17.0)
MCH: 31.2 pg (ref 26.0–34.0)
MCH: 31.2 pg (ref 26.0–34.0)
MCHC: 33.9 g/dL (ref 30.0–36.0)
MCHC: 33.8 g/dL (ref 30.0–36.0)
MCV: 92.1 fL (ref 80.0–100.0)
MCV: 92.4 fL (ref 80.0–100.0)
Platelets: 171 10*3/uL (ref 150–400)
Platelets: 260 10*3/uL (ref 150–400)
RBC: 3.94 MIL/uL — ABNORMAL LOW (ref 4.22–5.81)
RBC: 4.1 MIL/uL — ABNORMAL LOW (ref 4.22–5.81)
RDW: 12.8 % (ref 11.5–15.5)
RDW: 15.5 % (ref 11.5–15.5)
WBC: 8.6 10*3/uL (ref 4.0–10.5)
WBC: 13.5 10*3/uL — ABNORMAL HIGH (ref 4.0–10.5)
nRBC: 0 % (ref 0.0–0.2)
nRBC: 0 % (ref 0.0–0.2)

## 2023-01-24 LAB — GLUCOSE, CAPILLARY
Glucose-Capillary: 192 mg/dL — ABNORMAL HIGH (ref 70–99)
Glucose-Capillary: 228 mg/dL — ABNORMAL HIGH (ref 70–99)
Glucose-Capillary: 264 mg/dL — ABNORMAL HIGH (ref 70–99)
Glucose-Capillary: 238 mg/dL — ABNORMAL HIGH (ref 70–99)
Glucose-Capillary: 224 mg/dL — ABNORMAL HIGH (ref 70–99)
Glucose-Capillary: 397 mg/dL — ABNORMAL HIGH (ref 70–99)

## 2023-01-24 LAB — BASIC METABOLIC PANEL
Anion gap: 9 (ref 5–15)
Anion gap: 11 (ref 5–15)
BUN: 27 mg/dL — ABNORMAL HIGH (ref 8–23)
BUN: 25 mg/dL — ABNORMAL HIGH (ref 8–23)
CO2: 23 mmol/L (ref 22–32)
CO2: 21 mmol/L — ABNORMAL LOW (ref 22–32)
Calcium: 8.8 mg/dL — ABNORMAL LOW (ref 8.9–10.3)
Calcium: 8.4 mg/dL — ABNORMAL LOW (ref 8.9–10.3)
Chloride: 100 mmol/L (ref 98–111)
Chloride: 107 mmol/L (ref 98–111)
Creatinine, Ser: 2.28 mg/dL — ABNORMAL HIGH (ref 0.61–1.24)
Creatinine, Ser: 0.86 mg/dL (ref 0.61–1.24)
GFR, Estimated: 30 mL/min — ABNORMAL LOW (ref >60)
GFR, Estimated: >60 mL/min (ref >60)
Glucose, Bld: 217 mg/dL — ABNORMAL HIGH (ref 70–99)
Glucose, Bld: 115 mg/dL — ABNORMAL HIGH (ref 70–99)
Potassium: 3.3 mmol/L — ABNORMAL LOW (ref 3.5–5.1)
Potassium: 4.3 mmol/L (ref 3.5–5.1)
Sodium: 132 mmol/L — ABNORMAL LOW (ref 135–145)
Sodium: 139 mmol/L (ref 135–145)

## 2023-01-24 SURGERY — ARTHROPLASTY, HIP, TOTAL,POSTERIOR APPROACH
Anesthesia: General | Site: Hip | Laterality: Left

## 2023-01-24 MED ORDER — DEXAMETHASONE SODIUM PHOSPHATE 4 MG/ML IJ SOLN
INTRAMUSCULAR | Status: DC | PRN
  Administered 2023-01-24: 5 mg via INTRAVENOUS

## 2023-01-24 MED ORDER — FENTANYL CITRATE (PF) 250 MCG/5ML IJ SOLN
INTRAMUSCULAR | Status: AC
  Filled 2023-01-24: qty 5

## 2023-01-24 MED ORDER — TRANEXAMIC ACID-NACL 1000-0.7 MG/100ML-% IV SOLN
1000.0000 mg | INTRAVENOUS | Status: AC
  Administered 2023-01-24: 1000 mg via INTRAVENOUS
  Filled 2023-01-24: qty 100

## 2023-01-24 MED ORDER — SODIUM CHLORIDE (PF) 0.9 % IJ SOLN
INTRAMUSCULAR | Status: DC | PRN
  Administered 2023-01-24: 30 mL via INTRAVENOUS

## 2023-01-24 MED ORDER — SODIUM CHLORIDE (PF) 0.9 % IJ SOLN
INTRAMUSCULAR | Status: AC
  Filled 2023-01-24: qty 50

## 2023-01-24 MED ORDER — 0.9 % SODIUM CHLORIDE (POUR BTL) OPTIME
TOPICAL | Status: DC | PRN
  Administered 2023-01-24: 1000 mL

## 2023-01-24 MED ORDER — FENTANYL CITRATE (PF) 100 MCG/2ML IJ SOLN
INTRAMUSCULAR | Status: AC
  Filled 2023-01-24: qty 2

## 2023-01-24 MED ORDER — ROCURONIUM BROMIDE 10 MG/ML (PF) SYRINGE
PREFILLED_SYRINGE | INTRAVENOUS | Status: AC
  Filled 2023-01-24: qty 10

## 2023-01-24 MED ORDER — EPHEDRINE SULFATE-NACL 50-0.9 MG/10ML-% IV SOSY
PREFILLED_SYRINGE | INTRAVENOUS | Status: DC | PRN
  Administered 2023-01-24: 5 mg via INTRAVENOUS

## 2023-01-24 MED ORDER — PROPOFOL 10 MG/ML IV BOLUS
INTRAVENOUS | Status: DC | PRN
  Administered 2023-01-24: 150 mg via INTRAVENOUS

## 2023-01-24 MED ORDER — DEXAMETHASONE SODIUM PHOSPHATE 10 MG/ML IJ SOLN
INTRAMUSCULAR | Status: AC
  Filled 2023-01-24: qty 1

## 2023-01-24 MED ORDER — CHLORHEXIDINE GLUCONATE 0.12 % MT SOLN
15.0000 mL | Freq: Once | OROMUCOSAL | Status: AC
  Administered 2023-01-24: 15 mL via OROMUCOSAL
  Filled 2023-01-24: qty 15

## 2023-01-24 MED ORDER — EPHEDRINE 5 MG/ML INJ
INTRAVENOUS | Status: AC
  Filled 2023-01-24: qty 5

## 2023-01-24 MED ORDER — ASPIRIN 81 MG PO TBEC
81.0000 mg | DELAYED_RELEASE_TABLET | Freq: Two times a day (BID) | ORAL | Status: AC
  Administered 2023-01-25 – 2023-01-26 (×3): 81 mg via ORAL
  Filled 2023-01-24 (×3): qty 1

## 2023-01-24 MED ORDER — ISOPROPYL ALCOHOL 70 % SOLN
Status: DC | PRN
  Administered 2023-01-24: 1 via TOPICAL

## 2023-01-24 MED ORDER — BUPIVACAINE-EPINEPHRINE 0.25% -1:200000 IJ SOLN
INTRAMUSCULAR | Status: AC
  Filled 2023-01-24: qty 1

## 2023-01-24 MED ORDER — LACTATED RINGERS IV SOLN: INTRAVENOUS | Status: DC | PRN

## 2023-01-24 MED ORDER — EPHEDRINE SULFATE-NACL 50-0.9 MG/10ML-% IV SOSY: PREFILLED_SYRINGE | INTRAVENOUS | Status: DC | PRN

## 2023-01-24 MED ORDER — SUGAMMADEX SODIUM 200 MG/2ML IV SOLN
INTRAVENOUS | Status: DC | PRN
  Administered 2023-01-24: 200 mg via INTRAVENOUS

## 2023-01-24 MED ORDER — PROPOFOL 10 MG/ML IV BOLUS
INTRAVENOUS | Status: AC
  Filled 2023-01-24: qty 20

## 2023-01-24 MED ORDER — INSULIN ASPART 100 UNIT/ML IJ SOLN
INTRAMUSCULAR | Status: AC
  Filled 2023-01-24: qty 1

## 2023-01-24 MED ORDER — BUPIVACAINE LIPOSOME 1.3 % IJ SUSP
INTRAMUSCULAR | Status: DC | PRN
  Administered 2023-01-24: 20 mL

## 2023-01-24 MED ORDER — WATER FOR IRRIGATION, STERILE IR SOLN
Status: DC | PRN
  Administered 2023-01-24: 2000 mL

## 2023-01-24 MED ORDER — INSULIN ASPART 100 UNIT/ML IJ SOLN
5.0000 [IU] | Freq: Once | INTRAMUSCULAR | Status: AC
  Administered 2023-01-24: 5 [IU] via SUBCUTANEOUS

## 2023-01-24 MED ORDER — POVIDONE-IODINE 10 % EX SWAB: 2.0000 | Freq: Once | CUTANEOUS | Status: DC

## 2023-01-24 MED ORDER — ISOPROPYL ALCOHOL 70 % SOLN
Status: AC
  Filled 2023-01-24: qty 480

## 2023-01-24 MED ORDER — FENTANYL CITRATE (PF) 100 MCG/2ML IJ SOLN
INTRAMUSCULAR | Status: DC | PRN
  Administered 2023-01-24: 100 ug via INTRAVENOUS
  Administered 2023-01-24: 25 ug via INTRAVENOUS
  Administered 2023-01-24 (×2): 50 ug via INTRAVENOUS
  Administered 2023-01-24: 25 ug via INTRAVENOUS
  Administered 2023-01-24: 100 ug via INTRAVENOUS

## 2023-01-24 MED ORDER — ROCURONIUM BROMIDE 10 MG/ML (PF) SYRINGE
PREFILLED_SYRINGE | INTRAVENOUS | Status: DC | PRN
  Administered 2023-01-24: 60 mg via INTRAVENOUS

## 2023-01-24 MED ORDER — CEFAZOLIN SODIUM-DEXTROSE 2-4 GM/100ML-% IV SOLN
2.0000 g | INTRAVENOUS | Status: AC
Start: 2023-01-24 — End: 2023-01-24
  Administered 2023-01-24: 2 g via INTRAVENOUS
  Filled 2023-01-24: qty 100

## 2023-01-24 MED ORDER — INSULIN ASPART 100 UNIT/ML IJ SOLN: 0.0000 [IU] | INTRAMUSCULAR | Status: DC | PRN

## 2023-01-24 MED ORDER — BUPIVACAINE-EPINEPHRINE 0.25% -1:200000 IJ SOLN
INTRAMUSCULAR | Status: DC | PRN
  Administered 2023-01-24: 30 mL

## 2023-01-24 MED ORDER — BUPIVACAINE LIPOSOME 1.3 % IJ SUSP
INTRAMUSCULAR | Status: AC
  Filled 2023-01-24: qty 20

## 2023-01-24 MED ORDER — FENTANYL CITRATE PF 50 MCG/ML IJ SOSY: 25.0000 ug | PREFILLED_SYRINGE | INTRAMUSCULAR | Status: DC | PRN

## 2023-01-24 MED ORDER — TICAGRELOR 90 MG PO TABS
90.0000 mg | ORAL_TABLET | Freq: Two times a day (BID) | ORAL | Status: DC
  Administered 2023-01-26 – 2023-01-30 (×9): 90 mg via ORAL
  Filled 2023-01-24 (×10): qty 1

## 2023-01-24 MED ORDER — LIDOCAINE HCL (PF) 2 % IJ SOLN
INTRAMUSCULAR | Status: AC
  Filled 2023-01-24: qty 5

## 2023-01-24 MED ORDER — LIDOCAINE 2% (20 MG/ML) 5 ML SYRINGE
INTRAMUSCULAR | Status: DC | PRN
  Administered 2023-01-24: 60 mg via INTRAVENOUS

## 2023-01-24 MED ORDER — INSULIN ASPART 100 UNIT/ML IJ SOLN
3.0000 [IU] | Freq: Once | INTRAMUSCULAR | Status: AC
  Administered 2023-01-24: 3 [IU] via SUBCUTANEOUS

## 2023-01-24 MED ORDER — SODIUM CHLORIDE 0.9 % IR SOLN
Status: DC | PRN
  Administered 2023-01-24: 1000 mL

## 2023-01-24 MED ORDER — CHLORHEXIDINE GLUCONATE 4 % EX SOLN: 60.0000 mL | Freq: Once | CUTANEOUS | Status: DC

## 2023-01-24 MED ORDER — ONDANSETRON HCL 4 MG/2ML IJ SOLN
INTRAMUSCULAR | Status: AC
  Filled 2023-01-24: qty 2

## 2023-01-24 SURGICAL SUPPLY — 74 items
ADH SKN CLS APL DERMABOND .7 (GAUZE/BANDAGES/DRESSINGS) ×1
APL PRP STRL LF DISP 70% ISPRP (MISCELLANEOUS) ×2
BAG COUNTER SPONGE SURGICOUNT (BAG) IMPLANT
BAG SPEC THK2 15X12 ZIP CLS (MISCELLANEOUS) ×1
BAG SPNG CNTER NS LX DISP (BAG)
BAG ZIPLOCK 12X15 (MISCELLANEOUS) ×1 IMPLANT
BLADE SAW SAG 25X90X1.19 (BLADE) ×1 IMPLANT
CHLORAPREP W/TINT 26 (MISCELLANEOUS) ×2 IMPLANT
CNTNR URN SCR LID CUP LEK RST (MISCELLANEOUS) ×1 IMPLANT
CONT SPEC 4OZ STRL OR WHT (MISCELLANEOUS) ×1
COVER SURGICAL LIGHT HANDLE (MISCELLANEOUS) ×1 IMPLANT
DERMABOND ADVANCED .7 DNX12 (GAUZE/BANDAGES/DRESSINGS) ×1 IMPLANT
DRAPE HIP W/POCKET STRL (MISCELLANEOUS) ×1 IMPLANT
DRAPE INCISE IOBAN 66X45 STRL (DRAPES) ×1 IMPLANT
DRAPE INCISE IOBAN 85X60 (DRAPES) ×1 IMPLANT
DRAPE POUCH INSTRU U-SHP 10X18 (DRAPES) ×1 IMPLANT
DRAPE SHEET LG 3/4 BI-LAMINATE (DRAPES) ×3 IMPLANT
DRAPE U-SHAPE 47X51 STRL (DRAPES) ×2 IMPLANT
DRESSING AQUACEL AG SP 3.5X10 (GAUZE/BANDAGES/DRESSINGS) ×1 IMPLANT
DRSG AQUACEL AG ADV 3.5X10 (GAUZE/BANDAGES/DRESSINGS) IMPLANT
DRSG AQUACEL AG SP 3.5X10 (GAUZE/BANDAGES/DRESSINGS) ×1
ELECT BLADE TIP CTD 4 INCH (ELECTRODE) ×1 IMPLANT
ELECT REM PT RETURN 15FT ADLT (MISCELLANEOUS) ×1 IMPLANT
GAUZE SPONGE 4X4 12PLY STRL (GAUZE/BANDAGES/DRESSINGS) ×1 IMPLANT
GLOVE BIO SURGEON STRL SZ 6.5 (GLOVE) ×2 IMPLANT
GLOVE BIOGEL PI IND STRL 6.5 (GLOVE) ×1 IMPLANT
GLOVE BIOGEL PI IND STRL 8 (GLOVE) ×1 IMPLANT
GLOVE SURG ORTHO 8.0 STRL STRW (GLOVE) ×2 IMPLANT
GOWN STRL REUS W/ TWL XL LVL3 (GOWN DISPOSABLE) ×2 IMPLANT
GOWN STRL REUS W/TWL XL LVL3 (GOWN DISPOSABLE) ×2
HANDPIECE INTERPULSE COAX TIP (DISPOSABLE)
HEAD BIOLOX HIP 36/+2.5 (Joint) IMPLANT
HIP BIOLOX HD 36/+2.5 (Joint) ×1 IMPLANT
HOLDER FOLEY CATH W/STRAP (MISCELLANEOUS) ×1 IMPLANT
HOOD PEEL AWAY T7 (MISCELLANEOUS) ×3 IMPLANT
INSERT TRIDENT POLY 36 0DEG (Insert) IMPLANT
KIT BASIN OR (CUSTOM PROCEDURE TRAY) ×1 IMPLANT
KIT TURNOVER KIT A (KITS) IMPLANT
MANIFOLD NEPTUNE II (INSTRUMENTS) ×1 IMPLANT
MARKER SKIN DUAL TIP RULER LAB (MISCELLANEOUS) ×1 IMPLANT
NDL SAFETY ECLIPSE 18X1.5 (NEEDLE) ×2 IMPLANT
NS IRRIG 1000ML POUR BTL (IV SOLUTION) ×1 IMPLANT
PACK TOTAL JOINT (CUSTOM PROCEDURE TRAY) ×1 IMPLANT
PAD ARMBOARD 7.5X6 YLW CONV (MISCELLANEOUS) ×1 IMPLANT
PRESSURIZER FEMORAL UNIV (MISCELLANEOUS) IMPLANT
PROTECTOR NERVE ULNAR (MISCELLANEOUS) IMPLANT
RETRIEVER SUT HEWSON (MISCELLANEOUS) ×1 IMPLANT
SCREW HEX LP 6.5X30 (Screw) IMPLANT
SCREW HEX LP 6.5X35 (Screw) IMPLANT
SEALER BIPOLAR AQUA 6.0 (INSTRUMENTS) IMPLANT
SET HNDPC FAN SPRY TIP SCT (DISPOSABLE) IMPLANT
SHELL CLUSTERHOLE ACETABULAR 5 (Shell) IMPLANT
SHIELD FACE FULL FLUID (MISCELLANEOUS) ×1 IMPLANT
SOLUTION IRRIG SURGIPHOR (IV SOLUTION) IMPLANT
SPIKE FLUID TRANSFER (MISCELLANEOUS) ×1 IMPLANT
STEM ACCOLADE SZ 6 (Hips) IMPLANT
SUCTION TUBE FRAZIER 12FR DISP (SUCTIONS) ×1 IMPLANT
SUT BONE WAX W31G (SUTURE) ×1 IMPLANT
SUT ETHIBOND #5 BRAIDED 30INL (SUTURE) ×1 IMPLANT
SUT MNCRL AB 3-0 PS2 18 (SUTURE) ×1 IMPLANT
SUT STRATAFIX 0 PDS 27 VIOLET (SUTURE) ×1
SUT STRATAFIX PDO 1 14 VIOLET (SUTURE) ×1
SUT STRATFX PDO 1 14 VIOLET (SUTURE) ×1
SUT VIC AB 2-0 CT2 27 (SUTURE) ×2 IMPLANT
SUTURE STRATFX 0 PDS 27 VIOLET (SUTURE) ×1 IMPLANT
SUTURE STRATFX PDO 1 14 VIOLET (SUTURE) ×1 IMPLANT
SYR 30ML LL (SYRINGE) ×1 IMPLANT
SYR 50ML LL SCALE MARK (SYRINGE) ×1 IMPLANT
TOWEL OR 17X26 10 PK STRL BLUE (TOWEL DISPOSABLE) ×1 IMPLANT
TOWER CARTRIDGE SMART MIX (DISPOSABLE) IMPLANT
TRAY FOLEY MTR SLVR 16FR STAT (SET/KITS/TRAYS/PACK) IMPLANT
TUBE SUCTION HIGH CAP CLEAR NV (SUCTIONS) ×1 IMPLANT
UNDERPAD 30X36 HEAVY ABSORB (UNDERPADS AND DIAPERS) ×1 IMPLANT
WATER STERILE IRR 1000ML POUR (IV SOLUTION) ×2 IMPLANT

## 2023-01-24 NOTE — Inpatient Diabetes Management (Signed)
Inpatient Diabetes Program Recommendations  AACE/ADA: New Consensus Statement on Inpatient Glycemic Control (2015)  Target Ranges:  Prepandial:   less than 140 mg/dL      Peak postprandial:   less than 180 mg/dL (1-2 hours)      Critically ill patients:  140 - 180 mg/dL   Lab Results  Component Value Date   GLUCAP 192 (H) 01/24/2023   HGBA1C 10.1 (H) 01/22/2023    Review of Glycemic Control  Latest Reference Range & Units 01/23/23 08:41 01/23/23 11:20 01/23/23 16:22 01/23/23 20:21 01/24/23 07:43  Glucose-Capillary 70 - 99 mg/dL 272 (H) 536 (H) 644 (H) 251 (H) 192 (H)   Diabetes history: DM 2 Outpatient Diabetes medications: Jardiance 10 mg Daily, Glipizide 5 mg Daily Current orders for Inpatient glycemic control:  Novolog 0-9 units tid + hs  A1c 10.1% on 10/21   Note: renal function improved this am.   Inpatient Diabetes Program Recommendations:    -   Consider increasing Novolog Correction to 0-15 units tid + hs scale  If decadron is used during surgery, may need small dose of Novolog meal coverage insulin when eating.  Thanks,  Christena Deem RN, MSN, BC-ADM Inpatient Diabetes Coordinator Team Pager [phone] (8a-5p)

## 2023-01-24 NOTE — Transfer of Care (Signed)
Immediate Anesthesia Transfer of Care Note  Patient: Jerry Moran  Procedure(s) Performed: TOTAL HIP ARTHROPLASTY (Left: Hip)  Patient Location: PACU  Anesthesia Type:General  Level of Consciousness: awake and patient cooperative  Airway & Oxygen Therapy: Patient Spontanous Breathing and Patient connected to face mask  Post-op Assessment: Report given to RN and Post -op Vital signs reviewed and stable  Post vital signs: Reviewed and stable  Last Vitals:  Vitals Value Taken Time  BP 138/80 01/24/23 1237  Temp    Pulse 81 01/24/23 1239  Resp 20 01/24/23 1239  SpO2 96 % 01/24/23 1239  Vitals shown include unfiled device data.  Last Pain:  Vitals:   01/24/23 0901  TempSrc: Oral  PainSc: 0-No pain      Patients Stated Pain Goal: 5 (01/24/23 0901)  Complications: No notable events documented.

## 2023-01-24 NOTE — Plan of Care (Signed)
  Problem: Education: Goal: Ability to describe self-care measures that may prevent or decrease complications (Diabetes Survival Skills Education) will improve Outcome: Progressing Goal: Individualized Educational Video(s) Outcome: Progressing   Problem: Coping: Goal: Ability to adjust to condition or change in health will improve Outcome: Progressing   Problem: Fluid Volume: Goal: Ability to maintain a balanced intake and output will improve Outcome: Progressing   Problem: Fluid Volume: Goal: Ability to maintain a balanced intake and output will improve Outcome: Progressing   Problem: Fluid Volume: Goal: Ability to maintain a balanced intake and output will improve Outcome: Progressing   Problem: Health Behavior/Discharge Planning: Goal: Ability to identify and utilize available resources and services will improve Outcome: Progressing Goal: Ability to manage health-related needs will improve Outcome: Progressing   Problem: Metabolic: Goal: Ability to maintain appropriate glucose levels will improve Outcome: Progressing   Problem: Nutritional: Goal: Maintenance of adequate nutrition will improve Outcome: Progressing Goal: Progress toward achieving an optimal weight will improve Outcome: Progressing   Problem: Skin Integrity: Goal: Risk for impaired skin integrity will decrease Outcome: Progressing   Problem: Tissue Perfusion: Goal: Adequacy of tissue perfusion will improve Outcome: Progressing   Problem: Education: Goal: Knowledge of General Education information will improve Description: Including pain rating scale, medication(s)/side effects and non-pharmacologic comfort measures Outcome: Progressing   Problem: Health Behavior/Discharge Planning: Goal: Ability to manage health-related needs will improve Outcome: Progressing

## 2023-01-24 NOTE — Discharge Instructions (Addendum)
INSTRUCTIONS AFTER JOINT REPLACEMENT   Remove items at home which could result in a fall. This includes throw rugs or furniture in walking pathways ICE to the affected joint every three hours while awake for 30 minutes at a time, for at least the first 3-5 days, and then as needed for pain and swelling.  Continue to use ice for pain and swelling. You may notice swelling that will progress down to the foot and ankle.  This is normal after surgery.  Elevate your leg when you are not up walking on it.   Continue to use the breathing machine you got in the hospital (incentive spirometer) which will help keep your temperature down.  It is common for your temperature to cycle up and down following surgery, especially at night when you are not up moving around and exerting yourself.  The breathing machine keeps your lungs expanded and your temperature down.  DIET:  As you were doing prior to hospitalization, we recommend a well-balanced diet.  DRESSING / WOUND CARE / SHOWERING:  Keep the surgical dressing until follow up.  The dressing is water proof, so you can shower without any extra covering.  IF THE DRESSING FALLS OFF or the wound gets wet inside, change the dressing with sterile gauze.  Please use good hand washing techniques before changing the dressing.  Do not use any lotions or creams on the incision until instructed by your surgeon.    ACTIVITY  Increase activity slowly as tolerated, but follow the weight bearing instructions below.   No driving for 6 weeks or until further direction given by your physician.  You cannot drive while taking narcotics.  No lifting or carrying greater than 10 lbs. until further directed by your surgeon. Avoid periods of inactivity such as sitting longer than an hour when not asleep. This helps prevent blood clots.  You may return to work once you are authorized by your doctor.   WEIGHT BEARING: Weight bearing as tolerated with assist device (walker, cane, etc) as  directed, use it as long as suggested by your surgeon or therapist, typically at least 4-6 weeks.  EXERCISES  Results after joint replacement surgery are often greatly improved when you follow the exercise, range of motion and muscle strengthening exercises prescribed by your doctor. Safety measures are also important to protect the joint from further injury. Any time any of these exercises cause you to have increased pain or swelling, decrease what you are doing until you are comfortable again and then slowly increase them. If you have problems or questions, call your caregiver or physical therapist for advice.   Rehabilitation is important following a joint replacement. After just a few days of immobilization, the muscles of the leg can become weakened and shrink (atrophy).  These exercises are designed to build up the tone and strength of the thigh and leg muscles and to improve motion. Often times heat used for twenty to thirty minutes before working out will loosen up your tissues and help with improving the range of motion but do not use heat for the first two weeks following surgery (sometimes heat can increase post-operative swelling).   These exercises can be done on a training (exercise) mat, on the floor, on a table or on a bed. Use whatever works the best and is most comfortable for you.    Use music or television while you are exercising so that the exercises are a pleasant break in your day. This will make your life  better with the exercises acting as a break in your routine that you can look forward to.   Perform all exercises about fifteen times, three times per day or as directed.  You should exercise both the operative leg and the other leg as well.  Exercises include:   Quad Sets - Tighten up the muscle on the front of the thigh (Quad) and hold for 5-10 seconds.   Straight Leg Raises - With your knee straight (if you were given a brace, keep it on), lift the leg to 60 degrees, hold  for 3 seconds, and slowly lower the leg.  Perform this exercise against resistance later as your leg gets stronger.  Leg Slides: Lying on your back, slowly slide your foot toward your buttocks, bending your knee up off the floor (only go as far as is comfortable). Then slowly slide your foot back down until your leg is flat on the floor again.  Angel Wings: Lying on your back spread your legs to the side as far apart as you can without causing discomfort.  Hamstring Strength:  Lying on your back, push your heel against the floor with your leg straight by tightening up the muscles of your buttocks.  Repeat, but this time bend your knee to a comfortable angle, and push your heel against the floor.  You may put a pillow under the heel to make it more comfortable if necessary.   A rehabilitation program following joint replacement surgery can speed recovery and prevent re-injury in the future due to weakened muscles. Contact your doctor or a physical therapist for more information on knee rehabilitation.   CONSTIPATION:  Constipation is defined medically as fewer than three stools per week and severe constipation as less than one stool per week.  Even if you have a regular bowel pattern at home, your normal regimen is likely to be disrupted due to multiple reasons following surgery.  Combination of anesthesia, postoperative narcotics, change in appetite and fluid intake all can affect your bowels.   YOU MUST use at least one of the following options; they are listed in order of increasing strength to get the job done.  They are all available over the counter, and you may need to use some, POSSIBLY even all of these options:    Drink plenty of fluids (prune juice may be helpful) and high fiber foods Colace 100 mg by mouth twice a day  Senokot for constipation as directed and as needed Dulcolax (bisacodyl), take with full glass of water  Miralax (polyethylene glycol) once or twice a day as needed.  If you  have tried all these things and are unable to have a bowel movement in the first 3-4 days after surgery call either your surgeon or your primary doctor.    If you experience loose stools or diarrhea, hold the medications until you stool forms back up.  If your symptoms do not get better within 1 week or if they get worse, check with your doctor.  If you experience "the worst abdominal pain ever" or develop nausea or vomiting, please contact the office immediately for further recommendations for treatment.  ITCHING:  If you experience itching with your medications, try taking only a single pain pill, or even half a pain pill at a time.  You can also use Benadryl over the counter for itching or also to help with sleep.   TED HOSE STOCKINGS:  Use stockings on both legs until for at least 2 weeks or  as directed by physician office. They may be removed at night for sleeping.  MEDICATIONS:  See your medication summary on the "After Visit Summary" that nursing will review with you.  You may have some home medications which will be placed on hold until you complete the course of blood thinner medication.  It is important for you to complete the blood thinner medication as prescribed.  Blood clot prevention (DVT Prophylaxis): After surgery you are at an increased risk for a blood clot. You will take aspirin 81mg  Thursday morning, Thursday evening, and Friday morning. Resume your Brilinta Friday evening as prescribed.  This is to help reduce your risk of getting a blood clot.  Signs of a pulmonary embolus (blood clot in the lungs) include sudden short of breath, feeling lightheaded or dizzy, chest pain with a deep breath, rapid pulse rapid breathing.  Signs of a blood clot in your arms or legs include new unexplained swelling and cramping, warm, red or darkened skin around the painful area.  Please call the office or 911 right away if these signs or symptoms develop.  PRECAUTIONS:   If you experience chest pain  or shortness of breath - call 911 immediately for transfer to the hospital emergency department.   If you develop a fever greater that 101 F, purulent drainage from wound, increased redness or drainage from wound, foul odor from the wound/dressing, or calf pain - CONTACT YOUR SURGEON.                                                   FOLLOW-UP APPOINTMENTS:  If you do not already have a post-op appointment, please call the office for an appointment to be seen by your surgeon.  Guidelines for how soon to be seen are listed in your "After Visit Summary", but are typically between 2-3 weeks after surgery.  If you have a specialized bandage, you may be told to follow up 1 week after surgery.  POST-OPERATIVE OPIOID TAPER INSTRUCTIONS: It is important to wean off of your opioid medication as soon as possible. If you do not need pain medication after your surgery it is ok to stop day one. Opioids include: Codeine, Hydrocodone(Norco, Vicodin), Oxycodone(Percocet, oxycontin) and hydromorphone amongst others.  Long term and even short term use of opiods can cause: Increased pain response Dependence Constipation Depression Respiratory depression And more.  Withdrawal symptoms can include Flu like symptoms Nausea, vomiting And more Techniques to manage these symptoms Hydrate well Eat regular healthy meals Stay active Use relaxation techniques(deep breathing, meditating, yoga) Do Not substitute Alcohol to help with tapering If you have been on opioids for less than two weeks and do not have pain than it is ok to stop all together.  Plan to wean off of opioids This plan should start within one week post op of your joint replacement. Maintain the same interval or time between taking each dose and first decrease the dose.  Cut the total daily intake of opioids by one tablet each day Next start to increase the time between doses. The last dose that should be eliminated is the evening dose.   MAKE  SURE YOU:  Understand these instructions.  Get help right away if you are not doing well or get worse.    Thank you for letting us be a part of your medical care  team.  It is a privilege we respect greatly.  We hope these instructions will help you stay on track for a fast and full recovery!   Follow up with PCP in 1-2 weeks Please obtain BMP/CBC in one week "3.  Orthopedics to schedule follow-up
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
## 2023-01-24 NOTE — Op Note (Signed)
01/22/2023 - 01/24/2023  12:22 PM  PATIENT:  Jerry Moran   MRN: [id number]  PRE-OPERATIVE DIAGNOSIS: Left displaced femoral neck fracture  POST-OPERATIVE DIAGNOSIS:  same  PROCEDURE: Left TOTAL HIP ARTHROPLASTY  PREOPERATIVE INDICATIONS:    Jerry Moran is an 67 y.o. male who has a diagnosis of left displaced femoral neck fracture and elected for surgical management.  The risks benefits and alternatives were discussed with the patient including but not limited to the risks of nonoperative treatment, versus surgical intervention including infection, bleeding, nerve injury, periprosthetic fracture, the need for revision surgery, dislocation, leg length discrepancy, blood clots, cardiopulmonary complications, morbidity, mortality, among others, and they were willing to proceed.     OPERATIVE REPORT     SURGEON:  Weber Cooks, MD    ASSISTANT: Kathie Dike, [name], (Present throughout the entire procedure,  necessary for completion of procedure in a timely manner, assisting with retraction, instrumentation, and closure)     ANESTHESIA:  general  ESTIMATED BLOOD LOSS: 250cc    COMPLICATIONS:  None.   COMPONENTS:    Implant Name Type Inv. Item Serial No. Manufacturer Lot No. LRB No. Used Action  SCREW HEX LP 6.5X30 - [id number] Screw SCREW HEX LP 6.5X30  [employer] [id number] Left 1 Implanted  SCREW HEX LP 6.5X35 - [id number] Screw SCREW HEX LP 6.5X35  [employer] [id number] Left 1 Implanted  INSERT TRIDENT POLY 36 0DEG - [id number] Insert INSERT TRIDENT POLY 36 0DEG  [employer] [id number] Left 1 Implanted  SHELL CLUSTERHOLE ACETABULAR 5 - [id number] Shell SHELL CLUSTERHOLE ACETABULAR 5  [employer] [id number] A Left 1 Implanted  STEM ACCOLADE SZ 6 - [id number] Hips STEM ACCOLADE SZ 6  [employer] [id number] A Left 1 Implanted  HIP BIOLOX HD 36/+2.5 - [id number] Joint HIP BIOLOX HD 36/+2.5  [employer] [id number] Left 1 Implanted    The  aquamantis was utilized for this case to help facilitate better hemostasis as patient was felt to be at increased risk of bleeding because of recent anticoagulation use.      PROCEDURE IN DETAIL:   The patient was met in the holding area and  identified.  The appropriate hip was identified and marked at the operative site.  The patient was then transported to the OR  and  placed under anesthesia.  At that point, the patient was  placed in the lateral decubitus position with the operative side up and  secured to the operating room table  and all bony prominences padded. A subaxillary role was also placed.    The operative lower extremity was prepped from the iliac crest to the distal leg.  Sterile draping was performed.  Preoperative antibiotics, 2 gm of ancef,1 gm of Tranexamic Acid, and 8 mg of Decadron administered. Time out was performed prior to incision.      A routine posterolateral approach was utilized via sharp dissection  carried down to the subcutaneous tissue.  Gross bleeders were Bovie coagulated.  The iliotibial band was identified and incised along the length of the skin incision through the glute max fascia.  Charnley retractor was placed with care to protect the sciatic nerve posteriorly.  With the hip internally rotated, the piriformis tendon was identified and released from the femoral insertion and tagged with a #5 Ethibond.  A capsulotomy was then performed off the femoral insertion and also tagged with a #5 Ethibond.    The femoral neck was exposed, and I resected the femoral neck based on preoperative templating relative to the lesser  trochanter.    I then exposed the deep acetabulum, cleared out any tissue including the ligamentum teres.  After adequate visualization, I excised the labrum.  I then started reaming with a  63m reamer, first medializing to the floor of the cotyloid fossa, and then in the position of the cup aiming towards the greater sciatic notch, matching the  version of the transverse acetabular ligament and tucked under the anterior wall. I reamed up to 52 mm reamer with good bony bed preparation and a 52 mm cup was chosen.  The real cup was then impacted into place.  Appropriate version and inclination was confirmed clinically matching their bony anatomy, and also with the use of the jig.  I placed 2 screws in the posterior superior quadrant to augment fixation.  A neutral liner was placed and impacted. It was confirmed to be appropriately seated and the acetabular retractors were removed.    I then prepared the proximal femur using the box cutter, Charnley awl, and then sequentially broached starting with 0 up to a size 6.  A trial broach, neck, and head was utilized, and I reduced the hip and it was found to have excellent stability.  There was no impingement with full extension and 90 degrees external rotation.  The hip was stable at the position of sleep and with 90 degrees flexion and 80 degrees of internal rotation.  Leg lengths were also clinically assessed in the lateral position and felt to be equal. Intra-Op flatplate was obtained and confirmed appropriate component positions.  Good fill of the femur with the size 6 broach.  And restoration of leg length and offset. No evidence or concern for fracture.  A final femoral prosthesis size 6 was selected. I then impacted the real femoral prosthesis into place.I again trialed and selected a 36+ 2.38mm ball. The hip was then reduced and taken through a range of motion. There was no impingement with full extension and 90 degrees external rotation.  The hip was stable at the position of sleep and with 90 degrees flexion and 80 degrees of internal rotation. Leg lengths were  again assessed and felt to be restored.  We then opened, and I impacted the real head ball into place.  The posterior capsule was then closed with #5 Ethibond.  The piriformis was repaired through the base of the abductor tendon using a  [place] suture passer.  I then irrigated the hip copiously with dilute Betadine and with normal saline pulse lavage. Periarticular injection was then performed with Exparel.   We repaired the fascia #1 barbed suture, followed by 0 barbed suture for the subcutaneous fat.  Skin was closed with 2-0 Vicryl and 3-0 Monocryl.  Dermabond and Aquacel dressing were applied. The patient was then awakened and returned to PACU in stable and satisfactory condition.  Leg lengths in the supine position were assessed and felt to be clinically equal. There were no complications.  Post op recs: WB: WBAT LLE, No formal hip precautions Abx: ancef Imaging: PACU pelvis Xray Dressing: Aquacell, keep intact until follow up DVT prophylaxis: Aspirin 81BID starting POD1, then switch back to home dose Brilinta POD2 Follow up: 2 weeks after surgery for a wound check with Dr. Blanchie Dessert at [facility].  Address: [address], [place], [place] [address]  Office Phone: [phone]   Weber Cooks, MD Orthopedic Surgeon

## 2023-01-24 NOTE — Anesthesia Procedure Notes (Signed)
Procedure Name: Intubation Date/Time: 01/24/2023 10:35 AM  Performed by: Vanessa Frackville, CRNAPre-anesthesia Checklist: Patient identified, Emergency Drugs available, Suction available and Patient being monitored Patient Re-evaluated:Patient Re-evaluated prior to induction Oxygen Delivery Method: Circle system utilized Preoxygenation: Pre-oxygenation with 100% oxygen Induction Type: IV induction Ventilation: Mask ventilation without difficulty Laryngoscope Size: 2 and Miller Grade View: Grade II Tube type: Oral Tube size: 7.0 mm Number of attempts: 1 Airway Equipment and Method: Stylet Placement Confirmation: ETT inserted through vocal cords under direct vision, positive ETCO2 and breath sounds checked- equal and bilateral Secured at: 21 cm Tube secured with: Tape Dental Injury: Teeth and Oropharynx as per pre-operative assessment

## 2023-01-24 NOTE — Plan of Care (Signed)
  Problem: Education: Goal: Ability to describe self-care measures that may prevent or decrease complications (Diabetes Survival Skills Education) will improve Outcome: Progressing Goal: Individualized Educational Video(s) Outcome: Progressing   Problem: Fluid Volume: Goal: Ability to maintain a balanced intake and output will improve Outcome: Progressing   

## 2023-01-24 NOTE — Anesthesia Preprocedure Evaluation (Addendum)
Anesthesia Evaluation  Patient identified by MRN, date of birth, ID band Patient awake    Reviewed: Allergy & Precautions, H&P , NPO status , Patient's Chart, lab work & pertinent test results, reviewed documented beta blocker date and time   Airway Mallampati: II  TM Distance: >3 FB Neck ROM: Full    Dental no notable dental hx. (+) Missing, Dental Advisory Given   Pulmonary former smoker   Pulmonary exam normal breath sounds clear to auscultation       Cardiovascular hypertension, Pt. on medications and Pt. on home beta blockers + CAD and + Cardiac Stents   Rhythm:Regular Rate:Normal     Neuro/Psych CVA  negative psych ROS   GI/Hepatic negative GI ROS, Neg liver ROS,,,  Endo/Other  diabetes, Type 2, Oral Hypoglycemic Agents    Renal/GU Renal disease  negative genitourinary   Musculoskeletal   Abdominal   Peds  Hematology negative hematology ROS (+)   Anesthesia Other Findings   Reproductive/Obstetrics negative OB ROS                             Anesthesia Physical Anesthesia Plan  ASA: 3  Anesthesia Plan: General   Post-op Pain Management: Ofirmev IV (intra-op)*   Induction: Intravenous  PONV Risk Score and Plan: 3 and Ondansetron, Dexamethasone and Treatment may vary due to age or medical condition  Airway Management Planned: Oral ETT  Additional Equipment:   Intra-op Plan:   Post-operative Plan: Extubation in OR  Informed Consent: I have reviewed the patients History and Physical, chart, labs and discussed the procedure including the risks, benefits and alternatives for the proposed anesthesia with the patient or authorized representative who has indicated his/her understanding and acceptance.     Dental advisory given  Plan Discussed with: CRNA  Anesthesia Plan Comments:        Anesthesia Quick Evaluation

## 2023-01-25 ENCOUNTER — Encounter (HOSPITAL_COMMUNITY): Payer: Self-pay | Admitting: Orthopedic Surgery

## 2023-01-25 DIAGNOSIS — S72002A Fracture of unspecified part of neck of left femur, initial encounter for closed fracture: Secondary | ICD-10-CM | POA: Diagnosis not present

## 2023-01-25 LAB — COMPREHENSIVE METABOLIC PANEL
ALT: 12 U/L (ref 0–44)
AST: 19 U/L (ref 15–41)
Albumin: 2.9 g/dL — ABNORMAL LOW (ref 3.5–5.0)
Alkaline Phosphatase: 60 U/L (ref 38–126)
Anion gap: 11 (ref 5–15)
BUN: 36 mg/dL — ABNORMAL HIGH (ref 8–23)
CO2: 22 mmol/L (ref 22–32)
Calcium: 8.6 mg/dL — ABNORMAL LOW (ref 8.9–10.3)
Chloride: 99 mmol/L (ref 98–111)
Creatinine, Ser: 2.69 mg/dL — ABNORMAL HIGH (ref 0.61–1.24)
GFR, Estimated: 25 mL/min — ABNORMAL LOW (ref >60)
Glucose, Bld: 276 mg/dL — ABNORMAL HIGH (ref 70–99)
Potassium: 3.8 mmol/L (ref 3.5–5.1)
Sodium: 132 mmol/L — ABNORMAL LOW (ref 135–145)
Total Bilirubin: 1.3 mg/dL — ABNORMAL HIGH (ref 0.3–1.2)
Total Protein: 6.6 g/dL (ref 6.5–8.1)

## 2023-01-25 LAB — CBC
HCT: 33.8 % — ABNORMAL LOW (ref 39.0–52.0)
Hemoglobin: 11.4 g/dL — ABNORMAL LOW (ref 13.0–17.0)
MCH: 31.1 pg (ref 26.0–34.0)
MCHC: 33.7 g/dL (ref 30.0–36.0)
MCV: 92.1 fL (ref 80.0–100.0)
Platelets: 156 10*3/uL (ref 150–400)
RBC: 3.67 MIL/uL — ABNORMAL LOW (ref 4.22–5.81)
RDW: 12.8 % (ref 11.5–15.5)
WBC: 11.9 10*3/uL — ABNORMAL HIGH (ref 4.0–10.5)
nRBC: 0 % (ref 0.0–0.2)

## 2023-01-25 LAB — GLUCOSE, CAPILLARY
Glucose-Capillary: 189 mg/dL — ABNORMAL HIGH (ref 70–99)
Glucose-Capillary: 213 mg/dL — ABNORMAL HIGH (ref 70–99)
Glucose-Capillary: 234 mg/dL — ABNORMAL HIGH (ref 70–99)
Glucose-Capillary: 269 mg/dL — ABNORMAL HIGH (ref 70–99)
Glucose-Capillary: 286 mg/dL — ABNORMAL HIGH (ref 70–99)
Glucose-Capillary: 303 mg/dL — ABNORMAL HIGH (ref 70–99)
Glucose-Capillary: 351 mg/dL — ABNORMAL HIGH (ref 70–99)

## 2023-01-25 MED ORDER — TICAGRELOR 90 MG PO TABS: 90.0000 mg | ORAL_TABLET | Freq: Two times a day (BID) | ORAL | Status: DC

## 2023-01-25 MED ORDER — CEFADROXIL 500 MG PO CAPS: 500.0000 mg | ORAL_CAPSULE | Freq: Two times a day (BID) | ORAL | 0 refills | Status: DC

## 2023-01-25 MED ORDER — INSULIN GLARGINE-YFGN 100 UNIT/ML ~~LOC~~ SOLN
6.0000 [IU] | Freq: Every day | SUBCUTANEOUS | Status: DC
Start: 2023-01-25 — End: 2023-01-27
  Administered 2023-01-25 – 2023-01-27 (×3): 6 [IU] via SUBCUTANEOUS
  Filled 2023-01-25 (×3): qty 0.06

## 2023-01-25 MED ORDER — OXYCODONE HCL 5 MG PO TABS: 2.5000 mg | ORAL_TABLET | ORAL | 0 refills | Status: AC | PRN

## 2023-01-25 NOTE — Inpatient Diabetes Management (Addendum)
Inpatient Diabetes Program Recommendations  AACE/ADA: New Consensus Statement on Inpatient Glycemic Control (2015)  Target Ranges:  Prepandial:   less than 140 mg/dL      Peak postprandial:   less than 180 mg/dL (1-2 hours)      Critically ill patients:  140 - 180 mg/dL   Lab Results  Component Value Date   GLUCAP 269 (H) 01/25/2023   HGBA1C 10.1 (H) 01/22/2023    Review of Glycemic Control  Latest Reference Range & Units 01/24/23 07:43 01/24/23 12:40 01/24/23 13:21 01/24/23 14:06 01/24/23 16:12 01/24/23 20:36 01/25/23 00:03 01/25/23 03:59 01/25/23 07:39  Glucose-Capillary 70 - 99 mg/dL 161 (H) 096 (H) 045 (H) 238 (H) 224 (H) 397 (H) 303 (H) 286 (H) 269 (H)   Diabetes history: DM 2 Outpatient Diabetes medications: Jardiance 10 mg Daily, Glipizide 5 mg Daily Current orders for Inpatient glycemic control:  Novolog 0-9 units tid + hs  A1c 10.1% on 10/21   Note: decadron 5 mg given during procedure yesterday, renal function elevated again today  Inpatient Diabetes Program Recommendations:    -   Add Semglee 6 units  Thanks,  Christena Deem RN, MSN, BC-ADM Inpatient Diabetes Coordinator Team Pager [phone] (8a-5p)

## 2023-01-25 NOTE — Progress Notes (Signed)
PROGRESS NOTE    Jerry Moran  JOA:[id number] DOB: 08-01-1955 DOA: 01/22/2023 PCP: [facility]    Brief Narrative:  67 year old with past medical history significant for CAD non-STEMI 01/2022 status post DES to RCA residual multivessel CAD, stage IIIb creatinine baseline 2.0--2.4 presented after mechanical fall found to have left hip fracture.  He has been cleared by cardiology okay to hold Brilinta preop.  Patient was brought to [facility] for OR availability.  Underwent left total hip 10/23.  Subjective:  Patient seen and examined.  Mild pain but overall better.  No other overnight events.  Looking forward to get out of the bed.   Assessment & Plan:   Closed traumatic left hip fracture: Status post left total hip 10/23 Dr. Blanchie Dessert Weightbearing as tolerated left lower extremity DVT prophylaxis with aspirin 81 mg twice daily, ultimate plan to switch to Brilinta Postop follow-up orthopedics, dressing to keep intact until 2 weeks. Work with PT OT today.  Essential hypertension, uncontrolled: Currently better controlled on Norvasc, resumption of Cozaar and chlorthalidone.  Metoprolol.  Type 2 diabetes, uncontrolled with hyperglycemia.  Presented with blood sugars more than 400.  At home on glipizide and Jardiance.  Started on insulin.  Will continue.  Will add long-acting insulin today.  Hyperlipidemia, on Zetia and Lipitor. Coronary artery disease status post DES RCA in 2023.  Patient on Brilinta at home.  This was held for surgery.  Surgery okayed for patient to go back on Brilinta postop day 2. History of prostate cancer: On Flomax and oxybutynin. CKD stage IIIb: Creatinine at about baseline 2.28.    DVT prophylaxis: SCDs Start: 01/23/23 0756   Code Status: Full code Family Communication: None today. Disposition Plan: Status is: Inpatient Remains inpatient appropriate because: Immediate postop.  Will probably need rehab.     Consultants:   Orthopedics Cardiology  Procedures:  Left total hip  Antimicrobials:  Pre op       Objective: Vitals:   01/25/23 0417 01/25/23 0511 01/25/23 0523 01/25/23 0900  BP:  (!) 159/84  (!) 141/67  Pulse:  100  100  Resp: (!) 24 13 (!) 26 20  Temp:  98.2 F (36.8 C)  98.3 F (36.8 C)  TempSrc:  Oral  Oral  SpO2:  95%  93%  Weight:      Height:        Intake/Output Summary (Last 24 hours) at 01/25/2023 1100 Last data filed at 01/25/2023 0850 Gross per 24 hour  Intake 1900 ml  Output 1400 ml  Net 500 ml   Filed Weights   01/24/23 0901  Weight: 86.2 kg    Examination:  General exam: Appears calm and comfortable.  Interactive.  Pleasant. Respiratory system: Clear to auscultation. Respiratory effort normal. Cardiovascular system: S1 & S2 heard,  Gastrointestinal system: soft, NT,  Central nervous system: Alert and oriented. No focal neurological deficits. Extremities:  Left hip dressing intact.  Distal neurovascular status intact.    Data Reviewed: I have personally reviewed following labs and imaging studies  CBC: Recent Labs  Lab 01/22/23 1340 01/23/23 0807 01/24/23 0433 01/24/23 0540 01/25/23 0430  WBC 4.7 7.7 8.6 13.5* 11.9*  NEUTROABS 3.4  --   --   --   --   HGB 12.7* 12.0* 12.3* 12.8* 11.4*  HCT 37.0* 34.2* 36.3* 37.9* 33.8*  MCV 89.2 87.7 92.1 92.4 92.1  PLT 190 180 171 260 156   Basic Metabolic Panel: Recent Labs  Lab 01/22/23 1340 01/23/23 0807 01/24/23  4098 01/24/23 0540 01/25/23 0430  NA 133* 136 132* 139 132*  K 3.6 3.3* 3.3* 4.3 3.8  CL 101 101 100 107 99  CO2 22 22 23  21* 22  GLUCOSE 453* 254* 217* 115* 276*  BUN 22 20 27* 25* 36*  CREATININE 2.42* 2.41* 2.28* 0.86 2.69*  CALCIUM 8.9 9.5 8.8* 8.4* 8.6*   GFR: Estimated Creatinine Clearance: 27.7 mL/min (A) (by C-G formula based on SCr of 2.69 mg/dL (H)). Liver Function Tests: Recent Labs  Lab 01/22/23 1340 01/23/23 0807 01/25/23 0430  AST 18 13* 19  ALT 18 14 12    ALKPHOS 72 56 60  BILITOT 0.9 1.2 1.3*  PROT 7.1 6.5 6.6  ALBUMIN 3.5 2.9* 2.9*   No results for input(s): "LIPASE", "AMYLASE" in the last 168 hours. No results for input(s): "AMMONIA" in the last 168 hours. Coagulation Profile: No results for input(s): "INR", "PROTIME" in the last 168 hours. Cardiac Enzymes: No results for input(s): "CKTOTAL", "CKMB", "CKMBINDEX", "TROPONINI" in the last 168 hours. BNP (last 3 results) No results for input(s): "PROBNP" in the last 8760 hours. HbA1C: Recent Labs    01/22/23 2107  HGBA1C 10.1*   CBG: Recent Labs  Lab 01/24/23 1612 01/24/23 2036 01/25/23 0003 01/25/23 0359 01/25/23 0739  GLUCAP 224* 397* 303* 286* 269*   Lipid Profile: No results for input(s): "CHOL", "HDL", "LDLCALC", "TRIG", "CHOLHDL", "LDLDIRECT" in the last 72 hours. Thyroid Function Tests: No results for input(s): "TSH", "T4TOTAL", "FREET4", "T3FREE", "THYROIDAB" in the last 72 hours. Anemia Panel: No results for input(s): "VITAMINB12", "FOLATE", "FERRITIN", "TIBC", "IRON", "RETICCTPCT" in the last 72 hours. Sepsis Labs: No results for input(s): "PROCALCITON", "LATICACIDVEN" in the last 168 hours.  Recent Results (from the past 240 hour(s))  Surgical pcr screen     Status: Abnormal   Collection Time: 01/23/23  7:44 AM   Specimen: Nasal Mucosa; Nasal Swab  Result Value Ref Range Status   MRSA, PCR NEGATIVE NEGATIVE Final   Staphylococcus aureus POSITIVE (A) NEGATIVE Final    Comment: (NOTE) The Xpert SA Assay (FDA approved for NASAL specimens in patients 29 years of age and older), is one component of a comprehensive surveillance program. It is not intended to diagnose infection nor to guide or monitor treatment. Performed at [facility], [address] [place], [place] [address]          Radiology Studies: DG HIP UNILAT W OR W/O PELVIS 2-3 VIEWS LEFT  Result Date: 01/24/2023 CLINICAL DATA:  Status post left hip arthroplasty. EXAM: DG HIP  (WITH OR WITHOUT PELVIS) 2-3V LEFT COMPARISON:  Preoperative imaging FINDINGS: Left hip arthroplasty in expected alignment. No periprosthetic lucency or fracture. Recent postsurgical change includes air and edema in the soft tissues. Radiopaque debris projects over the left pelvis as before. IMPRESSION: Left hip arthroplasty without immediate postoperative complication. Electronically Signed   By: Narda Rutherford M.D.   On: 01/24/2023 14:40   DG Pelvis Portable  Result Date: 01/24/2023 CLINICAL DATA:  Elective surgery for left hip replacement. EXAM: PORTABLE PELVIS 1-2 VIEWS COMPARISON:  None Available. FINDINGS: Portable cross-table lateral view of the pelvis obtained in the operating room. Intraoperative image during left hip arthroplasty. IMPRESSION: Intraoperative image during left hip arthroplasty. Electronically Signed   By: Narda Rutherford M.D.   On: 01/24/2023 14:39        Scheduled Meds:  amLODipine  10 mg Oral Daily   aspirin EC  81 mg Oral BID   Chlorhexidine Gluconate Cloth  6  each Topical Daily   cholecalciferol  5,000 Units Oral Daily   docusate sodium  100 mg Oral BID   ezetimibe  10 mg Oral Daily   insulin aspart  0-5 Units Subcutaneous QHS   insulin aspart  0-9 Units Subcutaneous TID WC   insulin glargine-yfgn  6 Units Subcutaneous Daily   metoprolol succinate  25 mg Oral Daily   mupirocin ointment  1 Application Nasal BID   oxybutynin  2.5 mg Oral Daily   oxybutynin  5 mg Oral QHS   polyethylene glycol  17 g Oral Daily   rosuvastatin  10 mg Oral q1800   senna  1 tablet Oral BID   sodium chloride flush  3 mL Intravenous Q12H   [START ON 01/26/2023] ticagrelor  90 mg Oral BID   Continuous Infusions:   LOS: 3 days    Time spent: 35 minutes     Dorcas Carrow, MD [facility]

## 2023-01-25 NOTE — Anesthesia Postprocedure Evaluation (Signed)
Anesthesia Post Note  Patient: Jerry Moran  Procedure(s) Performed: TOTAL HIP ARTHROPLASTY (Left: Hip)     Patient location during evaluation: PACU Anesthesia Type: General Level of consciousness: awake and alert Pain management: pain level controlled Vital Signs Assessment: post-procedure vital signs reviewed and stable Respiratory status: spontaneous breathing, nonlabored ventilation, respiratory function stable and patient connected to nasal cannula oxygen Cardiovascular status: blood pressure returned to baseline and stable Postop Assessment: no apparent nausea or vomiting Anesthetic complications: no   No notable events documented.            Jullian Clayson,W. EDMOND

## 2023-01-25 NOTE — Progress Notes (Signed)
     1 Day Post-Op Procedure(s) (LRB): TOTAL HIP ARTHROPLASTY (Left) Subjective:  Patient reports pain as mild-moderate.  Slept okay.  Excited for breakfast.  Denies chest pain, ShOB, N/V.  Denies numbness or tingling.  To mobilize with PT today.  No other complaints at this time.  Objective:   VITALS:   Vitals:   01/25/23 0417 01/25/23 0511 01/25/23 0523 01/25/23 0900  BP:  (!) 159/84  (!) 141/67  Pulse:  100  100  Resp: (!) 24 13 (!) 26 20  Temp:  98.2 F (36.8 C)  98.3 F (36.8 C)  TempSrc:  Oral  Oral  SpO2:  95%  93%  Weight:      Height:        AAOx4, sitting comfortably in bed Neurovascular intact Sensation intact distally Intact pulses distally Dorsiflexion/Plantar flexion intact Incision: dressing C/D/I Compartment soft Wiggles toes appropriately   Lab Results  Component Value Date   WBC 11.9 (H) 01/25/2023   HGB 11.4 (L) 01/25/2023   HCT 33.8 (L) 01/25/2023   MCV 92.1 01/25/2023   PLT 156 01/25/2023   BMET    Component Value Date/Time   NA 132 (L) 01/25/2023 0430   NA 139 01/04/2023 1615   K 3.8 01/25/2023 0430   CL 99 01/25/2023 0430   CO2 22 01/25/2023 0430   GLUCOSE 276 (H) 01/25/2023 0430   BUN 36 (H) 01/25/2023 0430   BUN 22 01/04/2023 1615   CREATININE 2.69 (H) 01/25/2023 0430   CALCIUM 8.6 (L) 01/25/2023 0430   EGFR 32 (L) 01/04/2023 1615   GFRNONAA 25 (L) 01/25/2023 0430    Xray: stable post-operative imaging  Assessment/Plan: 1 Day Post-Op   Principal Problem:   Hip fracture (HCC) Active Problems:   Type 2 diabetes mellitus with hemoglobin A1c goal of less than 7.0% (HCC)   Essential hypertension   Malignant neoplasm of prostate (HCC)   Hyperlipidemia with target LDL less than 70   Coronary artery disease   CKD stage 3b, GFR 30-44 ml/min (HCC)   Hip fracture, left, closed, initial encounter [facility])   Narrative: 10/24: To mobilize with PT today, patient appears eager.  Labs reviewed, mild post-operative anemia.    Post  op recs: WB: WBAT LLE, No formal hip precautions Abx: ancef Imaging: PACU pelvis Xray Dressing: Aquacell, keep intact until follow up DVT prophylaxis: Aspirin 81BID starting POD1, then switch back to home dose Brilinta POD2 Follow up: 2 weeks after surgery for a wound check with Dr. Blanchie Dessert at [facility].  Address: [address], [place], [place] [address]  Office Phone: [phone]   Cecil Cobbs 01/25/2023, 10:40 AM   Contact information:   Weekdays 7am-5pm epic message Dr. Blanchie Dessert, or call office for patient follow up: [phone] After hours and holidays please check [email or web address] for group call information for [facility]

## 2023-01-25 NOTE — Plan of Care (Signed)

## 2023-01-25 NOTE — Evaluation (Signed)
Physical Therapy Evaluation Patient Details Name: Jerry Moran MRN: [id number] DOB: 1955/06/22 Today's Date: 01/25/2023  History of Present Illness  Pt is a 67 year old male presenting after mechanical fall found to have left displaced femoral neck fracture s/p L THA on 01/24/23. PMH significant for CAD non-STEMI 01/2022 status post DES to RCA residual multivessel CAD, stage IIIb creatinine baseline 2.0--2.4, DM, HTN, HLD, and CVA.   Clinical Impression  Pt is a 67 y.o. male with above HPI resulting in the deficits listed below (see PT Problem List). Pt typically independent with use of quad cane for mobility and ADLs at baseline. Pt required MOD A for bed mobility with increased time. Pt performed sit to stand transfers with MOD A and cues for safe hand placement and able to perform steps over to recliner with MIN A. PT reviewed LE HEP for promotion of DVT prevention and improved strength/ROM, pt demonstrated understanding. Pt will benefit from skilled PT to maximize functional mobility and increase independence. Pt reports he will have assist from his wife (retired) and that he may plan to call his brother for increased assist if needed as well.          If plan is discharge home, recommend the following: A lot of help with walking and/or transfers;A little help with bathing/dressing/bathroom;Help with stairs or ramp for entrance;Assist for transportation;Assistance with cooking/housework   Can travel by private Tax inspector (2 wheels)  Recommendations for Other Services  OT consult    Functional Status Assessment Patient has had a recent decline in their functional status and demonstrates the ability to make significant improvements in function in a reasonable and predictable amount of time.     Precautions / Restrictions Precautions Precautions: Fall Precaution Comments: WBAT LLE, No formal hip precautions Restrictions Weight Bearing  Restrictions: Yes LLE Weight Bearing: Weight bearing as tolerated Other Position/Activity Restrictions: "WBAT LLE, No formal hip precautions" per ortho op note      Mobility  Bed Mobility Overal bed mobility: Needs Assistance Bed Mobility: Supine to Sit     Supine to sit: Mod assist, HOB elevated, Used rails     General bed mobility comments: increased time.Cues for sequencing and education on WBAT status prior to mobility. Pt reports being anxious with mobility.    Transfers Overall transfer level: Needs assistance Equipment used: Rolling walker (2 wheels) Transfers: Sit to/from Stand, Bed to chair/wheelchair/BSC Sit to Stand: Mod assist, From elevated surface   Step pivot transfers: Min assist       General transfer comment: cues for hand placement assist for power up to full stand. MIN A once in static standing. Performance of lateral weight shifts prior to transfer to recliner. Decreased foot clearance L LE improved throughout transfer. Decreased standce time on L. Educated on use of UEs through RW for pain control with WB on L leg.    Ambulation/Gait                  Stairs            Wheelchair Mobility     Tilt Bed    Modified Rankin (Stroke Patients Only)       Balance                                             Pertinent  Vitals/Pain Pain Assessment Pain Assessment: 0-10 Pain Score: 7  Pain Location: L hip Pain Descriptors / Indicators: Sore, Discomfort Pain Intervention(s): Limited activity within patient's tolerance, Monitored during session, Repositioned, Patient requesting pain meds-RN notified (pt declined use of ice at end of session)    Home Living Family/patient expects to be discharged to:: Private residence Living Arrangements: Spouse/significant other;Children (6 y.o. daughter) Available Help at Discharge: Family Type of Home: House Home Access: Stairs to enter Entrance Stairs-Rails: None Entrance  Stairs-Number of Steps: 1   Home Layout: Two level;Able to live on main level with bedroom/bathroom Home Equipment: Cane - quad;Crutches (urinal) Additional Comments: Pt does not go upstairs in home. Brother can assist as well as family.    Prior Function Prior Level of Function : Independent/Modified Independent             Mobility Comments: use of quad cane ADLs Comments: wife does shopping, hasn't driven since had stroke.     Extremity/Trunk Assessment   Upper Extremity Assessment Upper Extremity Assessment: Defer to OT evaluation    Lower Extremity Assessment Lower Extremity Assessment: LLE deficits/detail LLE Deficits / Details: able to wiggle toes, perform AROM ankle DF/PF, fair quad set strength LLE Sensation: WNL    Cervical / Trunk Assessment Cervical / Trunk Assessment: Normal  Communication   Communication Communication: No apparent difficulties  Cognition Arousal: Alert Behavior During Therapy: WFL for tasks assessed/performed Overall Cognitive Status: Within Functional Limits for tasks assessed                                          General Comments      Exercises Total Joint Exercises Ankle Circles/Pumps: AROM, Both, 20 reps, Seated Quad Sets: AROM, Both, 10 reps, Seated Gluteal Sets: AROM, Both, 20 reps, Seated Heel Slides: AAROM, Left, 5 reps, Seated   Assessment/Plan    PT Assessment Patient needs continued PT services  PT Problem List Decreased strength;Decreased range of motion;Decreased activity tolerance;Decreased balance;Decreased mobility;Decreased knowledge of use of DME;Pain       PT Treatment Interventions DME instruction;Gait training;Stair training;Functional mobility training;Therapeutic activities;Therapeutic exercise;Balance training;Patient/family education    PT Goals (Current goals can be found in the Care Plan section)  Acute Rehab PT Goals Patient Stated Goal: Be moving better and be able to go  home PT Goal Formulation: With patient Time For Goal Achievement: 02/08/23 Potential to Achieve Goals: Good    Frequency Min 1X/week     Co-evaluation               AM-PAC PT "6 Clicks" Mobility  Outcome Measure Help needed turning from your back to your side while in a flat bed without using bedrails?: A Little Help needed moving from lying on your back to sitting on the side of a flat bed without using bedrails?: A Lot Help needed moving to and from a bed to a chair (including a wheelchair)?: A Little Help needed standing up from a chair using your arms (e.g., wheelchair or bedside chair)?: A Lot Help needed to walk in hospital room?: A Lot Help needed climbing 3-5 steps with a railing? : A Lot 6 Click Score: 14    End of Session Equipment Utilized During Treatment: Gait belt Activity Tolerance: Patient tolerated treatment well Patient left: in chair;with call bell/phone within reach;with chair alarm set;with SCD's reapplied Nurse Communication: Mobility status PT Visit Diagnosis: Muscle  weakness (generalized) (M62.81);Pain;Unsteadiness on feet (R26.81);Difficulty in walking, not elsewhere classified (R26.2) Pain - Right/Left: Left Pain - part of body: Hip    Time: 1610-9604 PT Time Calculation (min) (ACUTE ONLY): 38 min   Charges:   PT Evaluation $PT Eval Low Complexity: 1 Low PT Treatments $Therapeutic Exercise: 8-22 mins $Therapeutic Activity: 8-22 mins PT General Charges $$ ACUTE PT VISIT: 1 Visit         Lyman Speller PT, DPT  Acute Rehabilitation Services  Office [phone]

## 2023-01-26 DIAGNOSIS — S72002A Fracture of unspecified part of neck of left femur, initial encounter for closed fracture: Secondary | ICD-10-CM | POA: Diagnosis not present

## 2023-01-26 LAB — GLUCOSE, CAPILLARY
Glucose-Capillary: 167 mg/dL — ABNORMAL HIGH (ref 70–99)
Glucose-Capillary: 258 mg/dL — ABNORMAL HIGH (ref 70–99)
Glucose-Capillary: 204 mg/dL — ABNORMAL HIGH (ref 70–99)
Glucose-Capillary: 257 mg/dL — ABNORMAL HIGH (ref 70–99)

## 2023-01-26 MED ORDER — OXYCODONE HCL 5 MG PO TABS
5.0000 mg | ORAL_TABLET | ORAL | Status: DC | PRN
  Administered 2023-01-26 – 2023-01-29 (×3): 5 mg via ORAL
  Filled 2023-01-26 (×4): qty 1

## 2023-01-26 MED ORDER — OXYCODONE HCL 5 MG PO TABS: 5.0000 mg | ORAL_TABLET | ORAL | Status: DC | PRN

## 2023-01-26 NOTE — Evaluation (Signed)
Occupational Therapy Evaluation Patient Details Name: Jerry Moran MRN: [id number] DOB: 1955/10/25 Today's Date: 01/26/2023   History of Present Illness Pt is a 67 year old male presenting after mechanical fall found to have left displaced femoral neck fracture s/p L posterior THA on 01/24/23. PMH significant for CAD non-STEMI 01/2022 status post DES to RCA residual multivessel CAD, stage IIIb creatinine baseline 2.0--2.4, DM, HTN, HLD, and CVA.   Clinical Impression   Pt is currently presenting significantly below his baseline level of functioning for ADL management, given below listed deficits (see OT problem list). At current, he requires significant physical assist for tasks, including lower body dressing, toileting, and bed mobility. He reported 8/10 L hip pain during the therapy session & presented with associated guarding of movements. He was unable to tolerate out of bed activity today. He will benefit from further OT services to maximize his independence with ADLs. Patient will benefit from continued inpatient follow up therapy, <3 hours/day.       If plan is discharge home, recommend the following: A lot of help with bathing/dressing/bathroom;Two people to help with walking and/or transfers;Assistance with cooking/housework;Assist for transportation;Help with stairs or ramp for entrance    Functional Status Assessment  Patient has had a recent decline in their functional status and demonstrates the ability to make significant improvements in function in a reasonable and predictable amount of time.  Equipment Recommendations  Other (comment) (defer to next level of care)    Recommendations for Other Services       Precautions / Restrictions Precautions Precautions: Fall Precaution Comments: WBAT LLE, No formal hip precautions Restrictions Weight Bearing Restrictions: No LLE Weight Bearing: Weight bearing as tolerated      Mobility Bed Mobility Overal bed mobility:  Needs Assistance Bed Mobility: Supine to Sit, Sit to Supine     Supine to sit: Max assist, HOB elevated, Used rails Sit to supine: Mod assist, HOB elevated, +2 for physical assistance        Transfers      General transfer comment: pt gently deferred standing attempts, due to increased pain      Balance     Sitting balance-Leahy Scale: Poor Sitting balance - Comments: guarded; required min assist             ADL either performed or assessed with clinical judgement   ADL Overall ADL's : Needs assistance/impaired Eating/Feeding: Independent;Bed level   Grooming: Set up;Bed level           Upper Body Dressing : Minimal assistance;Bed level   Lower Body Dressing: Total assistance;Bed level Lower Body Dressing Details (indicate cue type and reason): for sock management in bed     Toileting- Clothing Manipulation and Hygiene: Maximal assistance;Bed level;Total assistance Toileting - Clothing Manipulation Details (indicate cue type and reason): based on clinical judgement                          Pertinent Vitals/Pain Pain Assessment Pain Assessment: 0-10 Pain Score: 8  Pain Location: L hip Pain Intervention(s): Limited activity within patient's tolerance, Monitored during session, Repositioned     Extremity/Trunk Assessment    BUE AROM WFL. Functional grip strength bilaterally         Communication Communication Communication: No apparent difficulties   Cognition Arousal: Alert Behavior During Therapy: WFL for tasks assessed/performed Overall Cognitive Status: Within Functional Limits for tasks assessed  General Comments: Increased time to respond at times - potentially distracted by pain; Oriented x4                Home Living Family/patient expects to be discharged to:: Private residence Living Arrangements: Spouse/significant other;Children (spouse and 71 yr old daughter) Available Help at Discharge:  Family Type of Home: House       Home Layout: Two level;Able to live on main level with bedroom/bathroom;1/2 bath on main level Alternate Level Stairs-Number of Steps: full bath, half bath, and bedroom on main level   Bathroom Shower/Tub: Walk-in shower                    Prior Functioning/Environment Prior Level of Function : Independent/Modified Independent             Mobility Comments: use of quad cane prn for ambulation ADLs Comments: He was independent with ADLs, his spouse performed cooking and cleaning, and he has not driven in a couple yrs.        OT Problem List: Decreased strength;Decreased range of motion;Decreased activity tolerance;Impaired balance (sitting and/or standing);Decreased knowledge of use of DME or AE;Pain      OT Treatment/Interventions: Self-care/ADL training;Therapeutic exercise;Energy conservation;DME and/or AE instruction;Therapeutic activities;Balance training;Patient/family education    OT Goals(Current goals can be found in the care plan section) Acute Rehab OT Goals Patient Stated Goal: to be able to walk OT Goal Formulation: With patient Time For Goal Achievement: 02/09/23 Potential to Achieve Goals: Good ADL Goals Pt Will Perform Lower Body Dressing: with contact guard assist;sitting/lateral leans Pt Will Transfer to Toilet: with contact guard assist;stand pivot transfer;bedside commode Pt Will Perform Toileting - Clothing Manipulation and hygiene: with contact guard assist;sit to/from stand Additional ADL Goal #1: Pt will perform bed mobility with CGA, in prep for out of bed ADLs.  OT Frequency: Min 1X/week       AM-PAC OT "6 Clicks" Daily Activity     Outcome Measure Help from another person eating meals?: None Help from another person taking care of personal grooming?: A Little Help from another person toileting, which includes using toliet, bedpan, or urinal?: A Lot Help from another person bathing (including washing,  rinsing, drying)?: A Lot Help from another person to put on and taking off regular upper body clothing?: A Little Help from another person to put on and taking off regular lower body clothing?: Total 6 Click Score: 15   End of Session Equipment Utilized During Treatment: Other (comment) (N/A) Nurse Communication: Mobility status  Activity Tolerance: Patient limited by pain Patient left: in bed;with call bell/phone within reach;with bed alarm set  OT Visit Diagnosis: Other abnormalities of gait and mobility (R26.89);Muscle weakness (generalized) (M62.81);Pain;Unsteadiness on feet (R26.81) Pain - Right/Left: Left Pain - part of body: Hip                Time: 1710-1731 OT Time Calculation (min): 21 min Charges:  OT General Charges $OT Visit: 1 Visit OT Evaluation $OT Eval Moderate Complexity: 1 Mod    Jerry Moran, OTR/L 01/26/2023, 5:40 PM

## 2023-01-26 NOTE — Progress Notes (Signed)
     2 Days Post-Op Procedure(s) (LRB): TOTAL HIP ARTHROPLASTY (Left) Subjective:  Patient reports pain as mild-moderate.  Slept okay, leg feels sore and stiff.  Denies chest pain, ShOB, N/V.  Denies numbness or tingling.  Mod assist with PT from sit to stand, still not ambulated yet.  No other complaints at this time.  Objective:   VITALS:   Vitals:   01/25/23 2126 01/26/23 0152 01/26/23 0429 01/26/23 0908  BP: (!) 163/87 (!) 147/74 (!) 157/81 (!) 165/78  Pulse: 91 85 87 83  Resp: 18 18 18    Temp: 98.5 F (36.9 C) 99.3 F (37.4 C) 98.4 F (36.9 C)   TempSrc: Oral Oral Oral   SpO2: 92% 94% 95%   Weight:      Height:        AAOx4, sitting comfortably in bed Neurovascular intact Sensation intact distally Intact pulses distally Dorsiflexion/Plantar flexion intact Incision: dressing C/D/I Compartment soft Leg lengths equal, tolerates very gentle ROM of surgical hip No significant ecchymosis/bruising Wiggles toes appropriately   Lab Results  Component Value Date   WBC 11.9 (H) 01/25/2023   HGB 11.4 (L) 01/25/2023   HCT 33.8 (L) 01/25/2023   MCV 92.1 01/25/2023   PLT 156 01/25/2023   BMET    Component Value Date/Time   NA 132 (L) 01/25/2023 0430   NA 139 01/04/2023 1615   K 3.8 01/25/2023 0430   CL 99 01/25/2023 0430   CO2 22 01/25/2023 0430   GLUCOSE 276 (H) 01/25/2023 0430   BUN 36 (H) 01/25/2023 0430   BUN 22 01/04/2023 1615   CREATININE 2.69 (H) 01/25/2023 0430   CALCIUM 8.6 (L) 01/25/2023 0430   EGFR 32 (L) 01/04/2023 1615   GFRNONAA 25 (L) 01/25/2023 0430    Xray: stable post-operative imaging  Assessment/Plan: 2 Days Post-Op   Principal Problem:   Hip fracture (HCC) Active Problems:   Type 2 diabetes mellitus with hemoglobin A1c goal of less than 7.0% (HCC)   Essential hypertension   Malignant neoplasm of prostate (HCC)   Hyperlipidemia with target LDL less than 70   Coronary artery disease   CKD stage 3b, GFR 30-44 ml/min (HCC)   Hip  fracture, left, closed, initial encounter [facility])   Narrative: 10/24: To mobilize with PT today, patient appears eager.  Labs reviewed, mild post-operative anemia.    10/25: Continue with PT.  Anemia appears stable.  Start Cefadroxil today for 7 days.  Anticipate likely SNF upon DC.  Post op recs: WB: WBAT LLE, No formal hip precautions Abx: ancef, then 7 days of Cefadroxil Imaging: PACU pelvis Xray Dressing: Aquacell, keep intact until follow up DVT prophylaxis: Aspirin 81BID starting POD1, then switch back to home dose Brilinta POD2 Follow up: 2 weeks after surgery for a wound check with Dr. Blanchie Dessert at [facility].  Address: [address], [place], [place] [address]  Office Phone: [phone]   Jerry Moran 01/26/2023, 12:52 PM   Contact information:   Weekdays 7am-5pm epic message Dr. Blanchie Dessert, or call office for patient follow up: [phone] After hours and holidays please check [email or web address] for group call information for [facility]

## 2023-01-26 NOTE — Plan of Care (Signed)
  Problem: Coping: Goal: Ability to adjust to condition or change in health will improve Outcome: Progressing   Problem: Fluid Volume: Goal: Ability to maintain a balanced intake and output will improve Outcome: Progressing   Problem: Health Behavior/Discharge Planning: Goal: Ability to identify and utilize available resources and services will improve Outcome: Progressing Goal: Ability to manage health-related needs will improve Outcome: Progressing   

## 2023-01-26 NOTE — Progress Notes (Signed)
PROGRESS NOTE    Jerry Moran  YQM:[id number] DOB: 10/21/1955 DOA: 01/22/2023 PCP: [facility]    Brief Narrative:  67 year old with past medical history significant for CAD non-STEMI 01/2022 status post DES to RCA residual multivessel CAD, stage IIIb creatinine baseline 2.0--2.4 presented after mechanical fall found to have left hip fracture.  He has been cleared by cardiology okay to hold Brilinta preop.  Patient was brought to [facility] for OR availability.  Underwent left total hip 10/23.  Subjective:  Seen and examined.  Was not able to walk yesterday.  He wants to walk today.  Wife at the bedside.  No other overnight events.  Mild to moderate pain present on mobility.   Assessment & Plan:   Closed traumatic left hip fracture: Status post left total hip 10/23 Dr. Blanchie Dessert Weightbearing as tolerated left lower extremity DVT prophylaxis with aspirin 81 mg twice daily, now switched to Brilinta.   Postop follow-up orthopedics, dressing to keep intact until 2 weeks. Work with PT OT today.  Refer to SNF.  Essential hypertension, uncontrolled: Currently better controlled on Norvasc, resumption of Cozaar and chlorthalidone.  Metoprolol.  Type 2 diabetes, uncontrolled with hyperglycemia.  Presented with blood sugars more than 400.  At home on glipizide and Jardiance.  Started on insulin.  Will continue.  Long-acting insulin added.  Hyperlipidemia, on Zetia and Lipitor. Coronary artery disease status post DES RCA in 2023.  Patient on Brilinta at home.  This was held for surgery.  Surgery okayed for patient to go back on Brilinta postop.  History of prostate cancer: On Flomax and oxybutynin. CKD stage IIIb: Creatinine at about baseline 2.28.    DVT prophylaxis: SCDs Start: 01/23/23 0756   Code Status: Full code Family Communication: Wife at the bedside. Disposition Plan: Status is: Inpatient Remains inpatient appropriate because: Postop.  Needs SNF.      Consultants:  Orthopedics Cardiology  Procedures:  Left total hip  Antimicrobials:  Pre op       Objective: Vitals:   01/26/23 0152 01/26/23 0429 01/26/23 0908 01/26/23 1345  BP: (!) 147/74 (!) 157/81 (!) 165/78 (!) 145/71  Pulse: 85 87 83 88  Resp: 18 18  18   Temp: 99.3 F (37.4 C) 98.4 F (36.9 C)  98.9 F (37.2 C)  TempSrc: Oral Oral  Oral  SpO2: 94% 95%  100%  Weight:      Height:        Intake/Output Summary (Last 24 hours) at 01/26/2023 1422 Last data filed at 01/26/2023 1000 Gross per 24 hour  Intake 1440 ml  Output 1175 ml  Net 265 ml   Filed Weights   01/24/23 0901  Weight: 86.2 kg    Examination:  General exam: Calm and comfortable.  Pleasant interaction. Respiratory system: Clear to auscultation. Respiratory effort normal. Cardiovascular system: S1 & S2 heard,  Gastrointestinal system: soft, NT,  Central nervous system: Alert and oriented. No focal neurological deficits. Extremities:  Left hip dressing intact.  Distal neurovascular status intact.    Data Reviewed: I have personally reviewed following labs and imaging studies  CBC: Recent Labs  Lab 01/22/23 1340 01/23/23 0807 01/24/23 0433 01/24/23 0540 01/25/23 0430  WBC 4.7 7.7 8.6 13.5* 11.9*  NEUTROABS 3.4  --   --   --   --   HGB 12.7* 12.0* 12.3* 12.8* 11.4*  HCT 37.0* 34.2* 36.3* 37.9* 33.8*  MCV 89.2 87.7 92.1 92.4 92.1  PLT 190 180 171 260 156   Basic  Metabolic Panel: Recent Labs  Lab 01/22/23 1340 01/23/23 0807 01/24/23 0433 01/24/23 0540 01/25/23 0430  NA 133* 136 132* 139 132*  K 3.6 3.3* 3.3* 4.3 3.8  CL 101 101 100 107 99  CO2 22 22 23  21* 22  GLUCOSE 453* 254* 217* 115* 276*  BUN 22 20 27* 25* 36*  CREATININE 2.42* 2.41* 2.28* 0.86 2.69*  CALCIUM 8.9 9.5 8.8* 8.4* 8.6*   GFR: Estimated Creatinine Clearance: 27.7 mL/min (A) (by C-G formula based on SCr of 2.69 mg/dL (H)). Liver Function Tests: Recent Labs  Lab 01/22/23 1340 01/23/23 0807  01/25/23 0430  AST 18 13* 19  ALT 18 14 12   ALKPHOS 72 56 60  BILITOT 0.9 1.2 1.3*  PROT 7.1 6.5 6.6  ALBUMIN 3.5 2.9* 2.9*   No results for input(s): "LIPASE", "AMYLASE" in the last 168 hours. No results for input(s): "AMMONIA" in the last 168 hours. Coagulation Profile: No results for input(s): "INR", "PROTIME" in the last 168 hours. Cardiac Enzymes: No results for input(s): "CKTOTAL", "CKMB", "CKMBINDEX", "TROPONINI" in the last 168 hours. BNP (last 3 results) No results for input(s): "PROBNP" in the last 8760 hours. HbA1C: No results for input(s): "HGBA1C" in the last 72 hours.  CBG: Recent Labs  Lab 01/25/23 1541 01/25/23 2007 01/25/23 2256 01/26/23 0757 01/26/23 1113  GLUCAP 213* 234* 189* 167* 258*   Lipid Profile: No results for input(s): "CHOL", "HDL", "LDLCALC", "TRIG", "CHOLHDL", "LDLDIRECT" in the last 72 hours. Thyroid Function Tests: No results for input(s): "TSH", "T4TOTAL", "FREET4", "T3FREE", "THYROIDAB" in the last 72 hours. Anemia Panel: No results for input(s): "VITAMINB12", "FOLATE", "FERRITIN", "TIBC", "IRON", "RETICCTPCT" in the last 72 hours. Sepsis Labs: No results for input(s): "PROCALCITON", "LATICACIDVEN" in the last 168 hours.  Recent Results (from the past 240 hour(s))  Surgical pcr screen     Status: Abnormal   Collection Time: 01/23/23  7:44 AM   Specimen: Nasal Mucosa; Nasal Swab  Result Value Ref Range Status   MRSA, PCR NEGATIVE NEGATIVE Final   Staphylococcus aureus POSITIVE (A) NEGATIVE Final    Comment: (NOTE) The Xpert SA Assay (FDA approved for NASAL specimens in patients 55 years of age and older), is one component of a comprehensive surveillance program. It is not intended to diagnose infection nor to guide or monitor treatment. Performed at [facility], [address], [place], [place] [address]          Radiology Studies: No results found.      Scheduled Meds:  amLODipine  10 mg Oral Daily    Chlorhexidine Gluconate Cloth  6 each Topical Daily   cholecalciferol  5,000 Units Oral Daily   docusate sodium  100 mg Oral BID   ezetimibe  10 mg Oral Daily   insulin aspart  0-5 Units Subcutaneous QHS   insulin aspart  0-9 Units Subcutaneous TID WC   insulin glargine-yfgn  6 Units Subcutaneous Daily   metoprolol succinate  25 mg Oral Daily   mupirocin ointment  1 Application Nasal BID   oxybutynin  2.5 mg Oral Daily   oxybutynin  5 mg Oral QHS   polyethylene glycol  17 g Oral Daily   rosuvastatin  10 mg Oral q1800   senna  1 tablet Oral BID   sodium chloride flush  3 mL Intravenous Q12H   ticagrelor  90 mg Oral BID   Continuous Infusions:   LOS: 4 days    Time spent: 35 minutes     Jerry Moran  Jerral Ralph, MD [facility]

## 2023-01-26 NOTE — Progress Notes (Signed)
Physical Therapy Treatment Patient Details Name: Jerry Moran MRN: [id number] DOB: Jul 16, 1955 Today's Date: 01/26/2023   History of Present Illness Pt is a 67 year old male presenting after mechanical fall found to have left displaced femoral neck fracture s/p L posterior THA on 01/24/23. PMH significant for CAD non-STEMI 01/2022 status post DES to RCA residual multivessel CAD, stage IIIb creatinine baseline 2.0--2.4, DM, HTN, HLD, and CVA.    PT Comments  Pt able to take a few steps today but does require mod A of 2 for transfers.  Needs increased time and cues for sequencing. Updated recommendation to Patient will benefit from continued inpatient follow up therapy, <3 hours/day at d/c.    If plan is discharge home, recommend the following: Help with stairs or ramp for entrance;Assist for transportation;Assistance with cooking/housework;Two people to help with walking and/or transfers;Two people to help with bathing/dressing/bathroom   Can travel by private vehicle     No  Equipment Recommendations  Rolling walker (2 wheels)    Recommendations for Other Services       Precautions / Restrictions Precautions Precautions: Fall Precaution Comments: WBAT LLE, No formal hip precautions Restrictions Weight Bearing Restrictions: No LLE Weight Bearing: Weight bearing as tolerated     Mobility  Bed Mobility Overal bed mobility: Needs Assistance Bed Mobility: Supine to Sit, Sit to Supine     Supine to sit: Mod assist, +2 for physical assistance Sit to supine: +2 for physical assistance, Mod assist   General bed mobility comments: Increased time, cues for sequencing, use of bed pad to assist    Transfers Overall transfer level: Needs assistance Equipment used: Rolling walker (2 wheels) Transfers: Sit to/from Stand Sit to Stand: Mod assist, From elevated surface, +2 physical assistance           General transfer comment: Cues for hand placement with mod A to power up.   Required increased time to rise with cues to shift hands to RW.  Performed x 2    Ambulation/Gait Ambulation/Gait assistance: Min assist, +2 physical assistance Gait Distance (Feet): 4 Feet (4'then 2') Assistive device: Rolling walker (2 wheels) Gait Pattern/deviations: Step-to pattern, Decreased stride length, Decreased weight shift to left, Antalgic Gait velocity: decreased     General Gait Details: Slow pattern with cues for sequencing.  Requiring assist for RW management, balance, and initially to advance L LE   Stairs             Wheelchair Mobility     Tilt Bed    Modified Rankin (Stroke Patients Only)       Balance Overall balance assessment: Needs assistance Sitting-balance support: Bilateral upper extremity supported Sitting balance-Leahy Scale: Poor Sitting balance - Comments: needs UE support and supervision   Standing balance support: Bilateral upper extremity supported Standing balance-Leahy Scale: Poor Standing balance comment: RW and min A                            Cognition Arousal: Alert Behavior During Therapy: WFL for tasks assessed/performed Overall Cognitive Status: Within Functional Limits for tasks assessed                                 General Comments: Increased time to respond at times - potentially distracted by pain        Exercises General Exercises - Lower Extremity Ankle Circles/Pumps: AROM, Both, 10 reps, Supine Long Arc  Quad: AROM, Right, AAROM, Left, 10 reps, Seated Heel Slides: AROM, Right, AAROM, Left, 10 reps, Supine Other Exercises Other Exercises: L side ROM as able; increased time for exercises on both sides    General Comments General comments (skin integrity, edema, etc.): VSS      Pertinent Vitals/Pain Pain Assessment Pain Assessment: 0-10 Pain Score: 6  Pain Location: L hip Pain Descriptors / Indicators: Sore, Discomfort Pain Intervention(s): Limited activity within patient's  tolerance, Monitored during session, Premedicated before session, Repositioned, Ice applied    Home Living                          Prior Function            PT Goals (current goals can now be found in the care plan section) Progress towards PT goals: Progressing toward goals    Frequency    Min 1X/week      PT Plan      Co-evaluation              AM-PAC PT "6 Clicks" Mobility   Outcome Measure  Help needed turning from your back to your side while in a flat bed without using bedrails?: A Lot Help needed moving from lying on your back to sitting on the side of a flat bed without using bedrails?: Total Help needed moving to and from a bed to a chair (including a wheelchair)?: Total Help needed standing up from a chair using your arms (e.g., wheelchair or bedside chair)?: Total Help needed to walk in hospital room?: Total Help needed climbing 3-5 steps with a railing? : Total 6 Click Score: 7    End of Session Equipment Utilized During Treatment: Gait belt Activity Tolerance: Patient tolerated treatment well Patient left: with call bell/phone within reach;with SCD's reapplied;in bed;with bed alarm set Nurse Communication: Mobility status;Need for lift equipment PT Visit Diagnosis: Muscle weakness (generalized) (M62.81);Pain;Unsteadiness on feet (R26.81);Difficulty in walking, not elsewhere classified (R26.2) Pain - Right/Left: Left Pain - part of body: Hip     Time: 1610-9604 PT Time Calculation (min) (ACUTE ONLY): 27 min  Charges:    $Therapeutic Exercise: 8-22 mins $Therapeutic Activity: 8-22 mins PT General Charges $$ ACUTE PT VISIT: 1 Visit                     Jerry Moran, PT Acute [facility] [phone]    Jerry Moran 01/26/2023, 5:05 PM

## 2023-01-26 NOTE — Plan of Care (Signed)
Problem: Education: Goal: Ability to describe self-care measures that may prevent or decrease complications (Diabetes Survival Skills Education) will improve 01/26/2023 0748 by Vita Barley, RN Outcome: Progressing 01/26/2023 0748 by Vita Barley, RN Outcome: Progressing Goal: Individualized Educational Video(s) 01/26/2023 0748 by Vita Barley, RN Outcome: Progressing 01/26/2023 0748 by Vita Barley, RN Outcome: Progressing   Problem: Coping: Goal: Ability to adjust to condition or change in health will improve 01/26/2023 0748 by Ada Woodbury A, RN Outcome: Progressing 01/26/2023 0748 by Vita Barley, RN Outcome: Progressing   Problem: Fluid Volume: Goal: Ability to maintain a balanced intake and output will improve 01/26/2023 0748 by Amilya Haver, Husain Costabile A, RN Outcome: Progressing 01/26/2023 0748 by Vita Barley, RN Outcome: Progressing   Problem: Health Behavior/Discharge Planning: Goal: Ability to identify and utilize available resources and services will improve 01/26/2023 0748 by Jiovanny Burdell, Caleen Jobs, RN Outcome: Progressing 01/26/2023 0748 by Vita Barley, RN Outcome: Progressing Goal: Ability to manage health-related needs will improve 01/26/2023 0748 by Dannia Snook A, RN Outcome: Progressing 01/26/2023 0748 by Lady Saucier A, RN Outcome: Progressing   Problem: Metabolic: Goal: Ability to maintain appropriate glucose levels will improve 01/26/2023 0748 by Tomorrow Dehaas, Liadan Guizar A, RN Outcome: Progressing 01/26/2023 0748 by Lady Saucier A, RN Outcome: Progressing   Problem: Nutritional: Goal: Maintenance of adequate nutrition will improve 01/26/2023 0748 by Amario Longmore, Christalyn Goertz A, RN Outcome: Progressing 01/26/2023 0748 by Vita Barley, RN Outcome: Progressing Goal: Progress toward achieving an optimal weight will improve 01/26/2023 0748 by Socrates Cahoon A, RN Outcome: Progressing 01/26/2023 0748 by  Lady Saucier A, RN Outcome: Progressing   Problem: Skin Integrity: Goal: Risk for impaired skin integrity will decrease 01/26/2023 0748 by Zebadiah Willert A, RN Outcome: Progressing 01/26/2023 0748 by Lady Saucier A, RN Outcome: Progressing   Problem: Tissue Perfusion: Goal: Adequacy of tissue perfusion will improve 01/26/2023 0748 by Anselm Aumiller, Nilaya Bouie A, RN Outcome: Progressing 01/26/2023 0748 by Vita Barley, RN Outcome: Progressing   Problem: Education: Goal: Knowledge of General Education information will improve Description: Including pain rating scale, medication(s)/side effects and non-pharmacologic comfort measures 01/26/2023 0748 by Kyree Adriano, Caleen Jobs, RN Outcome: Progressing 01/26/2023 0748 by Vita Barley, RN Outcome: Progressing   Problem: Health Behavior/Discharge Planning: Goal: Ability to manage health-related needs will improve 01/26/2023 0748 by Merlin Golden A, RN Outcome: Progressing 01/26/2023 0748 by Lady Saucier A, RN Outcome: Progressing   Problem: Clinical Measurements: Goal: Ability to maintain clinical measurements within normal limits will improve 01/26/2023 0748 by Seraya Jobst A, RN Outcome: Progressing 01/26/2023 0748 by Vita Barley, RN Outcome: Progressing Goal: Will remain free from infection 01/26/2023 0748 by Chadley Dziedzic, Caleen Jobs, RN Outcome: Progressing 01/26/2023 0748 by Vita Barley, RN Outcome: Progressing Goal: Diagnostic test results will improve 01/26/2023 0748 by Isbella Arline, Caleen Jobs, RN Outcome: Progressing 01/26/2023 0748 by Vita Barley, RN Outcome: Progressing Goal: Respiratory complications will improve 01/26/2023 0748 by Audree Schrecengost, Caleen Jobs, RN Outcome: Progressing 01/26/2023 0748 by Vita Barley, RN Outcome: Progressing Goal: Cardiovascular complication will be avoided 01/26/2023 0748 by Josealfredo Adkins A, RN Outcome: Progressing 01/26/2023 0748 by Vita Barley, RN Outcome: Progressing   Problem: Activity: Goal: Risk for activity intolerance will decrease 01/26/2023 0748 by Keyshla Tunison, Caleen Jobs, RN Outcome: Progressing 01/26/2023 0748 by Vita Barley, RN Outcome: Progressing   Problem: Nutrition: Goal: Adequate nutrition will be maintained 01/26/2023 0748 by Vita Barley, RN Outcome: Progressing 01/26/2023 0748 by Vita Barley, RN Outcome: Progressing  Problem: Coping: Goal: Level of anxiety will decrease 01/26/2023 0748 by Shomari Matusik A, RN Outcome: Progressing 01/26/2023 0748 by Vita Barley, RN Outcome: Progressing   Problem: Elimination: Goal: Will not experience complications related to bowel motility 01/26/2023 0748 by Bowman Higbie, Caleen Jobs, RN Outcome: Progressing 01/26/2023 0748 by Vita Barley, RN Outcome: Progressing Goal: Will not experience complications related to urinary retention 01/26/2023 0748 by Jacquel Redditt, Caleen Jobs, RN Outcome: Progressing 01/26/2023 0748 by Vita Barley, RN Outcome: Progressing   Problem: Pain Managment: Goal: General experience of comfort will improve 01/26/2023 0748 by Barry Culverhouse, Caleen Jobs, RN Outcome: Progressing 01/26/2023 0748 by Lady Saucier A, RN Outcome: Progressing   Problem: Safety: Goal: Ability to remain free from injury will improve 01/26/2023 0748 by Lileigh Fahringer, Caleen Jobs, RN Outcome: Progressing 01/26/2023 0748 by Lady Saucier A, RN Outcome: Progressing   Problem: Skin Integrity: Goal: Risk for impaired skin integrity will decrease 01/26/2023 0748 by Leilani Cespedes, Caleen Jobs, RN Outcome: Progressing 01/26/2023 0748 by Vita Barley, RN Outcome: Progressing

## 2023-01-26 NOTE — TOC Initial Note (Signed)
Transition of Care [facility]) - Initial/Assessment Note    Patient Details  Name: Jerry Moran MRN: [id number] Date of Birth: 1956/04/02  Transition of Care [facility]) CM/SW Contact:    Larrie Kass, LCSW Phone Number: 01/26/2023, 3:27 PM  Clinical Narrative:                 CSW met with pt and discussed recommendations for SNF placement. He stated he had never been placed in rehab before. Pt stated he would like to use his VA benefits, due to "trusting them more." CSW explained the process and that pt would need the VA's authorization. The VA transfer offices review for SNF placement Monday through Friday mornings. Pt has agreed for information to be sent out for review. CSW will follow up with pt on Monday. TOC to Follow.   Expected Discharge Plan:  (TBD) Barriers to Discharge: Continued Medical Work up   Patient Goals and CMS Choice Patient states their goals for this hospitalization and ongoing recovery are:: return home with home health CMS Medicare.gov Compare Post Acute Care list provided to:: Patient Choice offered to / list presented to : Patient      Expected Discharge Plan and Services       Living arrangements for the past 2 months: Single Family Home                                      Prior Living Arrangements/Services Living arrangements for the past 2 months: Single Family Home Lives with:: Self, Spouse Patient language and need for interpreter reviewed:: Yes Do you feel safe going back to the place where you live?: Yes      Need for Family Participation in Patient Care: No (Comment) Care giver support system in place?: No (comment)   Criminal Activity/Legal Involvement Pertinent to Current Situation/Hospitalization: No - Comment as needed  Activities of Daily Living   ADL Screening (condition at time of admission) Independently performs ADLs?: No Does the patient have a NEW difficulty with bathing/dressing/toileting/self-feeding that is  expected to last >3 days?: Yes (Initiates electronic notice to provider for possible OT consult) (broke left hip) Does the patient have a NEW difficulty with getting in/out of bed, walking, or climbing stairs that is expected to last >3 days?: Yes (Initiates electronic notice to provider for possible PT consult) (broke left hip) Does the patient have a NEW difficulty with communication that is expected to last >3 days?: No Is the patient deaf or have difficulty hearing?: No Does the patient have difficulty seeing, even when wearing glasses/contacts?: No Does the patient have difficulty concentrating, remembering, or making decisions?: Yes  Permission Sought/Granted                  Emotional Assessment Appearance:: Appears stated age Attitude/Demeanor/Rapport: Gracious, Engaged Affect (typically observed): Accepting Orientation: : Oriented to Self, Oriented to Place, Oriented to  Time, Oriented to Situation   Psych Involvement: No (comment)  Admission diagnosis:  Hip fracture (HCC) [S72.009A] Fall, initial encounter [W19.XXXA] Closed left hip fracture, initial encounter [facility]) [S72.002A] Patient Active Problem List   Diagnosis Date Noted   CKD stage 3b, GFR 30-44 ml/min (HCC) 01/22/2023   Hip fracture, left, closed, initial encounter (HCC) 01/22/2023   Hip fracture (HCC) 01/22/2023   GI bleed 01/25/2022   Stage 3a chronic kidney disease (CKD) (HCC) 04/30/2021   Coronary artery disease 04/30/2021   Hyperlipidemia with  target LDL less than 70 04/29/2021   Malignant neoplasm of prostate (HCC) 05/04/2020   Type 2 diabetes mellitus with hemoglobin A1c goal of less than 7.0% (HCC) 01/22/2019   Essential hypertension 01/22/2019   PCP:  [facility] Pharmacy:   [facility] - [place], [place] - [address] [place] [address] Phone: [phone] Fax: [phone]  [id number] [place], [place] - [address] AT [facility] [address] [address] [place] [address] Phone: [phone] Fax: [phone]     Social Determinants of Health (SDOH) Social History: SDOH Screenings   Food Insecurity: No Food Insecurity (01/22/2023)  Housing: Low Risk  (01/22/2023)  Transportation Needs: No Transportation Needs (01/22/2023)  Utilities: Not At Risk (01/22/2023)  Depression (PHQ2-9): Low Risk  (07/12/2021)  Tobacco Use: Medium Risk (01/24/2023)   SDOH Interventions:     Readmission Risk Interventions     No data to display

## 2023-01-26 NOTE — Plan of Care (Signed)

## 2023-01-27 DIAGNOSIS — S72002A Fracture of unspecified part of neck of left femur, initial encounter for closed fracture: Secondary | ICD-10-CM | POA: Diagnosis not present

## 2023-01-27 LAB — GLUCOSE, CAPILLARY
Glucose-Capillary: 216 mg/dL — ABNORMAL HIGH (ref 70–99)
Glucose-Capillary: 224 mg/dL — ABNORMAL HIGH (ref 70–99)
Glucose-Capillary: 175 mg/dL — ABNORMAL HIGH (ref 70–99)
Glucose-Capillary: 330 mg/dL — ABNORMAL HIGH (ref 70–99)
Glucose-Capillary: 339 mg/dL — ABNORMAL HIGH (ref 70–99)

## 2023-01-27 MED ORDER — INSULIN GLARGINE-YFGN 100 UNIT/ML ~~LOC~~ SOLN
10.0000 [IU] | Freq: Every day | SUBCUTANEOUS | Status: DC
Start: 2023-01-28 — End: 2023-01-31
  Administered 2023-01-28 – 2023-01-30 (×3): 10 [IU] via SUBCUTANEOUS
  Filled 2023-01-27 (×4): qty 0.1

## 2023-01-27 NOTE — Plan of Care (Signed)
  Problem: Education: Goal: Ability to describe self-care measures that may prevent or decrease complications (Diabetes Survival Skills Education) will improve Outcome: Progressing   Problem: Health Behavior/Discharge Planning: Goal: Ability to manage health-related needs will improve Outcome: Progressing   Problem: Activity: Goal: Risk for activity intolerance will decrease Outcome: Progressing   Problem: Nutrition: Goal: Adequate nutrition will be maintained Outcome: Progressing

## 2023-01-27 NOTE — Progress Notes (Signed)
PROGRESS NOTE    Tigh Klick  EPP:[id number] DOB: [birth year] DOA: 01/22/2023 PCP: [facility]    Brief Narrative:  67 year old with past medical history significant for CAD non-STEMI 01/2022 status post DES to RCA residual multivessel CAD, stage IIIb creatinine baseline 2.0--2.4 presented after mechanical fall found to have left hip fracture.  He has been cleared by cardiology okay to hold Brilinta preop.  Patient was brought to [facility] long hospital for OR availability.  Underwent left total hip 10/23.  Subjective:  Patient seen and examined.  Wife at the bedside.  No symptoms at rest but he had very difficulty getting out of the bed.  He has history of stroke with left hemiparesis and now having hard time walking with the left leg.   Assessment & Plan:   Closed traumatic left hip fracture: Status post left total hip 10/23 Dr. Blanchie Dessert Weightbearing as tolerated left lower extremity DVT prophylaxis with  Brilinta that he uses at home. Postop follow-up orthopedics, dressing to keep intact until 2 weeks. Work with PT OT today.  Refer to SNF.  Essential hypertension, uncontrolled: Currently better controlled on Norvasc, resumption of Cozaar and chlorthalidone.  Metoprolol.  Type 2 diabetes, uncontrolled with hyperglycemia.  Presented with blood sugars more than 400.  At home on glipizide and Jardiance.  Started on insulin.  Will continue.  Long-acting insulin added. Increase dose of long-acting insulin to 10 units today.  Hyperlipidemia, on Zetia and Lipitor. Coronary artery disease status post DES RCA in 2023.  Patient on Brilinta at home.  This was held for surgery.  Surgery okayed for patient to go back on Brilinta postop.  History of prostate cancer: On Flomax and oxybutynin. CKD stage IIIb: Creatinine at about baseline  DVT prophylaxis: SCDs Start: 01/23/23 0756   Code Status: Full code Family Communication: Wife at the bedside. Disposition Plan: Status  is: Inpatient Remains inpatient appropriate because: Postop.  Needs SNF.  Medically stable.     Consultants:  Orthopedics Cardiology  Procedures:  Left total hip  Antimicrobials:  Pre op       Objective: Vitals:   01/26/23 1746 01/26/23 1956 01/27/23 0423 01/27/23 1228  BP: (!) 153/72 (!) 160/82 (!) 156/77 (!) 155/88  Pulse: 76 83 89 87  Resp: 15 18 18 18   Temp: 98.2 F (36.8 C) 99.2 F (37.3 C) 98.5 F (36.9 C) 98.9 F (37.2 C)  TempSrc: Oral Oral Oral Oral  SpO2: 97% 94% 97% 97%  Weight:      Height:        Intake/Output Summary (Last 24 hours) at 01/27/2023 1354 Last data filed at 01/27/2023 1258 Gross per 24 hour  Intake 360 ml  Output 800 ml  Net -440 ml   Filed Weights   01/24/23 0901  Weight: 86.2 kg    Examination:  General exam: Calm and comfortable.  Pleasant interaction. Respiratory system: Clear to auscultation. Respiratory effort normal. Cardiovascular system: S1 & S2 heard,  Gastrointestinal system: soft, NT,  Central nervous system: Alert and oriented. No focal neurological deficits. Extremities:  Left hip dressing intact.  Distal neurovascular status intact.    Data Reviewed: I have personally reviewed following labs and imaging studies  CBC: Recent Labs  Lab 01/22/23 1340 01/23/23 0807 01/24/23 0433 01/24/23 0540 01/25/23 0430  WBC 4.7 7.7 8.6 13.5* 11.9*  NEUTROABS 3.4  --   --   --   --   HGB 12.7* 12.0* 12.3* 12.8* 11.4*  HCT 37.0* 34.2* 36.3* 37.9* 33.8*  MCV 89.2 87.7 92.1 92.4 92.1  PLT 190 180 171 260 156   Basic Metabolic Panel: Recent Labs  Lab 01/22/23 1340 01/23/23 0807 01/24/23 0433 01/24/23 0540 01/25/23 0430  NA 133* 136 132* 139 132*  K 3.6 3.3* 3.3* 4.3 3.8  CL 101 101 100 107 99  CO2 22 22 23  21* 22  GLUCOSE 453* 254* 217* 115* 276*  BUN 22 20 27* 25* 36*  CREATININE 2.42* 2.41* 2.28* 0.86 2.69*  CALCIUM 8.9 9.5 8.8* 8.4* 8.6*   GFR: Estimated Creatinine Clearance: 27.7 mL/min (A) (by C-G  formula based on SCr of 2.69 mg/dL (H)). Liver Function Tests: Recent Labs  Lab 01/22/23 1340 01/23/23 0807 01/25/23 0430  AST 18 13* 19  ALT 18 14 12   ALKPHOS 72 56 60  BILITOT 0.9 1.2 1.3*  PROT 7.1 6.5 6.6  ALBUMIN 3.5 2.9* 2.9*   No results for input(s): "LIPASE", "AMYLASE" in the last 168 hours. No results for input(s): "AMMONIA" in the last 168 hours. Coagulation Profile: No results for input(s): "INR", "PROTIME" in the last 168 hours. Cardiac Enzymes: No results for input(s): "CKTOTAL", "CKMB", "CKMBINDEX", "TROPONINI" in the last 168 hours. BNP (last 3 results) No results for input(s): "PROBNP" in the last 8760 hours. HbA1C: No results for input(s): "HGBA1C" in the last 72 hours.  CBG: Recent Labs  Lab 01/26/23 1113 01/26/23 1702 01/26/23 2129 01/27/23 0857 01/27/23 1228  GLUCAP 258* 204* 257* 216* 224*   Lipid Profile: No results for input(s): "CHOL", "HDL", "LDLCALC", "TRIG", "CHOLHDL", "LDLDIRECT" in the last 72 hours. Thyroid Function Tests: No results for input(s): "TSH", "T4TOTAL", "FREET4", "T3FREE", "THYROIDAB" in the last 72 hours. Anemia Panel: No results for input(s): "VITAMINB12", "FOLATE", "FERRITIN", "TIBC", "IRON", "RETICCTPCT" in the last 72 hours. Sepsis Labs: No results for input(s): "PROCALCITON", "LATICACIDVEN" in the last 168 hours.  Recent Results (from the past 240 hour(s))  Surgical pcr screen     Status: Abnormal   Collection Time: 01/23/23  7:44 AM   Specimen: Nasal Mucosa; Nasal Swab  Result Value Ref Range Status   MRSA, PCR NEGATIVE NEGATIVE Final   Staphylococcus aureus POSITIVE (A) NEGATIVE Final    Comment: (NOTE) The Xpert SA Assay (FDA approved for NASAL specimens in patients 74 years of age and older), is one component of a comprehensive surveillance program. It is not intended to diagnose infection nor to guide or monitor treatment. Performed at [facility], [address] [place], [place] [address]           Radiology Studies: No results found.      Scheduled Meds:  amLODipine  10 mg Oral Daily   Chlorhexidine Gluconate Cloth  6 each Topical Daily   cholecalciferol  5,000 Units Oral Daily   docusate sodium  100 mg Oral BID   ezetimibe  10 mg Oral Daily   insulin aspart  0-5 Units Subcutaneous QHS   insulin aspart  0-9 Units Subcutaneous TID WC   [START ON 01/28/2023] insulin glargine-yfgn  10 Units Subcutaneous Daily   metoprolol succinate  25 mg Oral Daily   mupirocin ointment  1 Application Nasal BID   oxybutynin  2.5 mg Oral Daily   oxybutynin  5 mg Oral QHS   polyethylene glycol  17 g Oral Daily   rosuvastatin  10 mg Oral q1800   senna  1 tablet Oral BID   sodium chloride flush  3 mL Intravenous Q12H   ticagrelor  90 mg Oral BID  Continuous Infusions:   LOS: 5 days    Time spent: 35 minutes     Dorcas Carrow, MD [facility]

## 2023-01-27 NOTE — Plan of Care (Signed)
  Problem: Activity: Goal: Risk for activity intolerance will decrease Outcome: Progressing   Problem: Nutrition: Goal: Adequate nutrition will be maintained Outcome: Progressing   Problem: Pain Managment: Goal: General experience of comfort will improve Outcome: Progressing   

## 2023-01-28 DIAGNOSIS — S72002A Fracture of unspecified part of neck of left femur, initial encounter for closed fracture: Secondary | ICD-10-CM | POA: Diagnosis not present

## 2023-01-28 LAB — GLUCOSE, CAPILLARY
Glucose-Capillary: 220 mg/dL — ABNORMAL HIGH (ref 70–99)
Glucose-Capillary: 197 mg/dL — ABNORMAL HIGH (ref 70–99)
Glucose-Capillary: 249 mg/dL — ABNORMAL HIGH (ref 70–99)
Glucose-Capillary: 288 mg/dL — ABNORMAL HIGH (ref 70–99)

## 2023-01-28 MED ORDER — INSULIN ASPART 100 UNIT/ML IJ SOLN
3.0000 [IU] | Freq: Three times a day (TID) | INTRAMUSCULAR | Status: DC
  Administered 2023-01-28 – 2023-01-30 (×5): 3 [IU] via SUBCUTANEOUS

## 2023-01-28 NOTE — Progress Notes (Signed)
PROGRESS NOTE    Jerry Moran  WUJ:[id number] DOB: 11/03/1955 DOA: 01/22/2023 PCP: [facility]    Brief Narrative:  67 year old with past medical history significant for CAD ,non-STEMI 01/2022 status post DES to RCA residual multivessel CAD, stage IIIb creatinine baseline 2.0--2.4 presented after mechanical fall found to have left hip fracture.  Patient was brought to [facility] long hospital for OR availability.  Underwent left total hip 10/23.  Subjective:  Patient seen and examined.  No overnight events.  Pain is controlled.   Assessment & Plan:   Closed traumatic left hip fracture: Status post left total hip 10/23 Dr. Blanchie Dessert Weightbearing as tolerated left lower extremity DVT prophylaxis with  Brilinta that he uses at home. Postop follow-up orthopedics, dressing to keep intact until 2 weeks. Work with PT OT today.  Refer to SNF.  Essential hypertension, uncontrolled: Currently better controlled on Norvasc, resumption of Cozaar and chlorthalidone.  Metoprolol.  Type 2 diabetes, uncontrolled with hyperglycemia.  Presented with blood sugars more than 400.  At home on glipizide and Jardiance.  Blood sugars still elevated.  Already on long-acting insulin 10 units.  Will add prandial insulin 3 units with meals.   Hyperlipidemia, on Zetia and Lipitor. Coronary artery disease status post DES RCA in 2023.  Patient on Brilinta at home.  This was held for surgery.  Surgery okayed for patient to go back on Brilinta postop.  History of prostate cancer: On Flomax and oxybutynin. CKD stage IIIb: Creatinine at about baseline.  Will recheck tomorrow morning.  DVT prophylaxis: SCDs Start: 01/23/23 0756   Code Status: Full code Family Communication: None today. Disposition Plan: Status is: Inpatient Remains inpatient appropriate because: Postop.  Needs SNF.  Medically stable.     Consultants:  Orthopedics Cardiology  Procedures:  Left total hip  Antimicrobials:   Pre op       Objective: Vitals:   01/27/23 1228 01/27/23 2155 01/28/23 0413 01/28/23 1144  BP: (!) 155/88 (!) 161/82 (!) 144/81 (!) 152/84  Pulse: 87 89 83 81  Resp: 18 16 16 18   Temp: 98.9 F (37.2 C) 98.9 F (37.2 C) 98.6 F (37 C) 97.8 F (36.6 C)  TempSrc: Oral Oral Oral Oral  SpO2: 97% 96% 95% 100%  Weight:      Height:        Intake/Output Summary (Last 24 hours) at 01/28/2023 1218 Last data filed at 01/28/2023 0850 Gross per 24 hour  Intake 240 ml  Output 1350 ml  Net -1110 ml   Filed Weights   01/24/23 0901  Weight: 86.2 kg    Examination:  General exam: Patient looks fairly comfortable.  Not in any distress. Respiratory system: Clear to auscultation. Respiratory effort normal. Cardiovascular system: S1 & S2 heard,  Gastrointestinal system: soft, NT,  Central nervous system: Alert and oriented. No focal neurological deficits. Extremities:  Left hip dressing intact.  Mild swelling present.  Minimal tenderness.  Distal neurovascular status intact.    Data Reviewed: I have personally reviewed following labs and imaging studies  CBC: Recent Labs  Lab 01/22/23 1340 01/23/23 0807 01/24/23 0433 01/24/23 0540 01/25/23 0430  WBC 4.7 7.7 8.6 13.5* 11.9*  NEUTROABS 3.4  --   --   --   --   HGB 12.7* 12.0* 12.3* 12.8* 11.4*  HCT 37.0* 34.2* 36.3* 37.9* 33.8*  MCV 89.2 87.7 92.1 92.4 92.1  PLT 190 180 171 260 156   Basic Metabolic Panel: Recent Labs  Lab 01/22/23 1340 01/23/23 0807 01/24/23  1478 01/24/23 0540 01/25/23 0430  NA 133* 136 132* 139 132*  K 3.6 3.3* 3.3* 4.3 3.8  CL 101 101 100 107 99  CO2 22 22 23  21* 22  GLUCOSE 453* 254* 217* 115* 276*  BUN 22 20 27* 25* 36*  CREATININE 2.42* 2.41* 2.28* 0.86 2.69*  CALCIUM 8.9 9.5 8.8* 8.4* 8.6*   GFR: Estimated Creatinine Clearance: 27.7 mL/min (A) (by C-G formula based on SCr of 2.69 mg/dL (H)). Liver Function Tests: Recent Labs  Lab 01/22/23 1340 01/23/23 0807 01/25/23 0430  AST  18 13* 19  ALT 18 14 12   ALKPHOS 72 56 60  BILITOT 0.9 1.2 1.3*  PROT 7.1 6.5 6.6  ALBUMIN 3.5 2.9* 2.9*   No results for input(s): "LIPASE", "AMYLASE" in the last 168 hours. No results for input(s): "AMMONIA" in the last 168 hours. Coagulation Profile: No results for input(s): "INR", "PROTIME" in the last 168 hours. Cardiac Enzymes: No results for input(s): "CKTOTAL", "CKMB", "CKMBINDEX", "TROPONINI" in the last 168 hours. BNP (last 3 results) No results for input(s): "PROBNP" in the last 8760 hours. HbA1C: No results for input(s): "HGBA1C" in the last 72 hours.  CBG: Recent Labs  Lab 01/27/23 1605 01/27/23 2204 01/27/23 2227 01/28/23 0825 01/28/23 1145  GLUCAP 175* 339* 330* 220* 197*   Lipid Profile: No results for input(s): "CHOL", "HDL", "LDLCALC", "TRIG", "CHOLHDL", "LDLDIRECT" in the last 72 hours. Thyroid Function Tests: No results for input(s): "TSH", "T4TOTAL", "FREET4", "T3FREE", "THYROIDAB" in the last 72 hours. Anemia Panel: No results for input(s): "VITAMINB12", "FOLATE", "FERRITIN", "TIBC", "IRON", "RETICCTPCT" in the last 72 hours. Sepsis Labs: No results for input(s): "PROCALCITON", "LATICACIDVEN" in the last 168 hours.  Recent Results (from the past 240 hour(s))  Surgical pcr screen     Status: Abnormal   Collection Time: 01/23/23  7:44 AM   Specimen: Nasal Mucosa; Nasal Swab  Result Value Ref Range Status   MRSA, PCR NEGATIVE NEGATIVE Final   Staphylococcus aureus POSITIVE (A) NEGATIVE Final    Comment: (NOTE) The Xpert SA Assay (FDA approved for NASAL specimens in patients 75 years of age and older), is one component of a comprehensive surveillance program. It is not intended to diagnose infection nor to guide or monitor treatment. Performed at [facility], [address], [place], [place] [address]          Radiology Studies: No results found.      Scheduled Meds:  amLODipine  10 mg Oral Daily   Chlorhexidine Gluconate  Cloth  6 each Topical Daily   cholecalciferol  5,000 Units Oral Daily   docusate sodium  100 mg Oral BID   ezetimibe  10 mg Oral Daily   insulin aspart  0-5 Units Subcutaneous QHS   insulin aspart  0-9 Units Subcutaneous TID WC   insulin aspart  3 Units Subcutaneous TID WC   insulin glargine-yfgn  10 Units Subcutaneous Daily   metoprolol succinate  25 mg Oral Daily   mupirocin ointment  1 Application Nasal BID   oxybutynin  2.5 mg Oral Daily   oxybutynin  5 mg Oral QHS   polyethylene glycol  17 g Oral Daily   rosuvastatin  10 mg Oral q1800   senna  1 tablet Oral BID   sodium chloride flush  3 mL Intravenous Q12H   ticagrelor  90 mg Oral BID   Continuous Infusions:   LOS: 6 days    Time spent: 35 minutes     Dorcas Carrow,  MD [facility]

## 2023-01-28 NOTE — Plan of Care (Signed)
  Problem: Coping: Goal: Ability to adjust to condition or change in health will improve Outcome: Progressing   Problem: Fluid Volume: Goal: Ability to maintain a balanced intake and output will improve Outcome: Progressing   Problem: Education: Goal: Individualized Educational Video(s) Outcome: Progressing   Problem: Health Behavior/Discharge Planning: Goal: Ability to identify and utilize available resources and services will improve Outcome: Progressing

## 2023-01-28 NOTE — Plan of Care (Signed)
  Problem: Activity: Goal: Risk for activity intolerance will decrease Outcome: Progressing   Problem: Safety: Goal: Ability to remain free from injury will improve Outcome: Progressing   

## 2023-01-29 DIAGNOSIS — S72002A Fracture of unspecified part of neck of left femur, initial encounter for closed fracture: Secondary | ICD-10-CM | POA: Diagnosis not present

## 2023-01-29 LAB — CBC WITH DIFFERENTIAL/PLATELET
Abs Immature Granulocytes: 0.08 10*3/uL — ABNORMAL HIGH (ref 0.00–0.07)
Basophils Absolute: 0 10*3/uL (ref 0.0–0.1)
Basophils Relative: 0 %
Eosinophils Absolute: 0.2 10*3/uL (ref 0.0–0.5)
Eosinophils Relative: 3 %
HCT: 28.6 % — ABNORMAL LOW (ref 39.0–52.0)
Hemoglobin: 9.5 g/dL — ABNORMAL LOW (ref 13.0–17.0)
Immature Granulocytes: 1 %
Lymphocytes Relative: 8 %
Lymphs Abs: 0.6 10*3/uL — ABNORMAL LOW (ref 0.7–4.0)
MCH: 31.3 pg (ref 26.0–34.0)
MCHC: 33.2 g/dL (ref 30.0–36.0)
MCV: 94.1 fL (ref 80.0–100.0)
Monocytes Absolute: 1.1 10*3/uL — ABNORMAL HIGH (ref 0.1–1.0)
Monocytes Relative: 14 %
Neutro Abs: 5.9 10*3/uL (ref 1.7–7.7)
Neutrophils Relative %: 74 %
Platelets: 247 10*3/uL (ref 150–400)
RBC: 3.04 MIL/uL — ABNORMAL LOW (ref 4.22–5.81)
RDW: 12.6 % (ref 11.5–15.5)
WBC: 8 10*3/uL (ref 4.0–10.5)
nRBC: 0 % (ref 0.0–0.2)

## 2023-01-29 LAB — BASIC METABOLIC PANEL
Anion gap: 8 (ref 5–15)
BUN: 42 mg/dL — ABNORMAL HIGH (ref 8–23)
CO2: 23 mmol/L (ref 22–32)
Calcium: 8.4 mg/dL — ABNORMAL LOW (ref 8.9–10.3)
Chloride: 105 mmol/L (ref 98–111)
Creatinine, Ser: 2.44 mg/dL — ABNORMAL HIGH (ref 0.61–1.24)
GFR, Estimated: 28 mL/min — ABNORMAL LOW (ref >60)
Glucose, Bld: 184 mg/dL — ABNORMAL HIGH (ref 70–99)
Potassium: 3.2 mmol/L — ABNORMAL LOW (ref 3.5–5.1)
Sodium: 136 mmol/L (ref 135–145)

## 2023-01-29 LAB — GLUCOSE, CAPILLARY
Glucose-Capillary: 202 mg/dL — ABNORMAL HIGH (ref 70–99)
Glucose-Capillary: 236 mg/dL — ABNORMAL HIGH (ref 70–99)
Glucose-Capillary: 264 mg/dL — ABNORMAL HIGH (ref 70–99)
Glucose-Capillary: 141 mg/dL — ABNORMAL HIGH (ref 70–99)

## 2023-01-29 MED ORDER — POTASSIUM CHLORIDE CRYS ER 20 MEQ PO TBCR
40.0000 meq | EXTENDED_RELEASE_TABLET | Freq: Two times a day (BID) | ORAL | Status: AC
  Administered 2023-01-29 – 2023-01-30 (×4): 40 meq via ORAL
  Filled 2023-01-29 (×4): qty 2

## 2023-01-29 NOTE — Progress Notes (Signed)
     5 Days Post-Op Procedure(s) (LRB): TOTAL HIP ARTHROPLASTY (Left) Subjective:  Patient reports pain is much improved over past few days. Making slow progress with PT. Limited by weakness given hx of prior stroke. Plan to DC to SNF this week.  Objective:   VITALS:   Vitals:   01/28/23 0413 01/28/23 1144 01/28/23 2122 01/29/23 0605  BP: (!) 144/81 (!) 152/84 (!) 145/70 (!) 144/76  Pulse: 83 81 82 80  Resp: 16 18 20 20   Temp: 98.6 F (37 C) 97.8 F (36.6 C) 98.2 F (36.8 C) 98.5 F (36.9 C)  TempSrc: Oral Oral Oral Oral  SpO2: 95% 100% 97% 100%  Weight:      Height:        AAOx4, sitting comfortably in bed Neurovascular intact Sensation intact distally Intact pulses distally Dorsiflexion/Plantar flexion intact Incision: dressing C/D/I Compartment soft Leg lengths equal, tolerates very gentle ROM of surgical hip No significant ecchymosis/bruising Wiggles toes appropriately   Lab Results  Component Value Date   WBC 8.0 01/29/2023   HGB 9.5 (L) 01/29/2023   HCT 28.6 (L) 01/29/2023   MCV 94.1 01/29/2023   PLT 247 01/29/2023   BMET    Component Value Date/Time   NA 136 01/29/2023 0437   NA 139 01/04/2023 1615   K 3.2 (L) 01/29/2023 0437   CL 105 01/29/2023 0437   CO2 23 01/29/2023 0437   GLUCOSE 184 (H) 01/29/2023 0437   BUN 42 (H) 01/29/2023 0437   BUN 22 01/04/2023 1615   CREATININE 2.44 (H) 01/29/2023 0437   CALCIUM 8.4 (L) 01/29/2023 0437   EGFR 32 (L) 01/04/2023 1615   GFRNONAA 28 (L) 01/29/2023 0437    Xray: stable post-operative imaging  Assessment/Plan: 5 Days Post-Op   Principal Problem:   Hip fracture (HCC) Active Problems:   Type 2 diabetes mellitus with hemoglobin A1c goal of less than 7.0% (HCC)   Essential hypertension   Malignant neoplasm of prostate (HCC)   Hyperlipidemia with target LDL less than 70   Coronary artery disease   CKD stage 3b, GFR 30-44 ml/min (HCC)   Hip fracture, left, closed, initial encounter  (HCC)   Post op recs: WB: WBAT LLE, No formal hip precautions Abx: ancef, then 7 days of Cefadroxil Imaging: PACU pelvis Xray Dressing: Aquacell, keep intact until follow up DVT prophylaxis: Aspirin 81BID starting POD1, then switch back to home dose Brilinta POD2 Follow up: 2 weeks after surgery for a wound check with Dr. Blanchie Dessert at [facility].  Address: [address], [place], [place] [address]  Office Phone: [phone]   Jerry Moran 01/29/2023, 9:58 AM   Contact information:   Weekdays 7am-5pm epic message Dr. Blanchie Dessert, or call office for patient follow up: [phone] After hours and holidays please check [email or web address] for group call information for [facility]

## 2023-01-29 NOTE — Progress Notes (Signed)
Physical Therapy Treatment Patient Details Name: Jerry Moran MRN: [id number] DOB: 1955-11-13 Today's Date: 01/29/2023   History of Present Illness Pt is a 67 year old male presenting after mechanical fall found to have left displaced femoral neck fracture s/p L posterior THA on 01/24/23. PMH significant for CAD non-STEMI 01/2022 status post DES to RCA residual multivessel CAD, stage IIIb creatinine baseline 2.0--2.4, DM, HTN, HLD, and CVA.    PT Comments   Jerry Moran is a 67 y.o. male POD 5 s/p fall and L THA. Patient is now limited by functional impairments (see PT problem list below) and requires mod A for bed mobility and max A for transfers. Patient was able to ambulate 4 feet with RW and min A level of assist. PPatient will benefit from continued skilled PT interventions to address impairments and progress towards PLOF. Acute PT will follow to progress mobility and stair training in preparation for safe discharge to next venue for continued skilled therapy intervention.     If plan is discharge home, recommend the following: Help with stairs or ramp for entrance;Assist for transportation;Assistance with cooking/housework;Two people to help with walking and/or transfers;Two people to help with bathing/dressing/bathroom   Can travel by private vehicle     No  Equipment Recommendations  Rolling walker (2 wheels)    Recommendations for Other Services OT consult     Precautions / Restrictions Precautions Precautions: Fall Precaution Comments: WBAT LLE, No formal hip precautions Restrictions Weight Bearing Restrictions: No LLE Weight Bearing: Weight bearing as tolerated Other Position/Activity Restrictions: "WBAT LLE, No formal hip precautions" per ortho op note     Mobility  Bed Mobility Overal bed mobility: Needs Assistance Bed Mobility: Supine to Sit     Supine to sit: HOB elevated, Used rails, Mod assist     General bed mobility comments: Increased time, cues  for sequencing, bed rails    Transfers Overall transfer level: Needs assistance Equipment used: Rolling walker (2 wheels) Transfers: Sit to/from Stand Sit to Stand: From elevated surface, Max assist           General transfer comment: cues, increased time and max A for sit to stand from elevated EOB with noted self limiting LLE WB, cues for extension posture    Ambulation/Gait Ambulation/Gait assistance: Min assist Gait Distance (Feet): 4 Feet Assistive device: Rolling walker (2 wheels) Gait Pattern/deviations: Step-to pattern, Decreased stride length, Decreased weight shift to left, Antalgic Gait velocity: decreased     General Gait Details: Slow pattern with cues for sequencing.  Requiring assist for RW management, balance, and specific cues to advance B LE with facilitation for weight shift and use of B UE support to offload L LE in stance phase   Stairs             Wheelchair Mobility     Tilt Bed    Modified Rankin (Stroke Patients Only)       Balance Overall balance assessment: Needs assistance Sitting-balance support: Feet supported Sitting balance-Leahy Scale: Fair     Standing balance support: Bilateral upper extremity supported, During functional activity, Reliant on assistive device for balance Standing balance-Leahy Scale: Poor Standing balance comment: RW and min A                            Cognition Arousal: Alert Behavior During Therapy: WFL for tasks assessed/performed Overall Cognitive Status: Within Functional Limits for tasks assessed  Exercises      General Comments        Pertinent Vitals/Pain Pain Assessment Pain Assessment: 0-10 Pain Score: 4  Pain Location: L hip Pain Descriptors / Indicators: Sore, Discomfort Pain Intervention(s): Limited activity within patient's tolerance, Monitored during session, Repositioned, Patient requesting pain meds-RN  notified, RN gave pain meds during session, Ice applied    Home Living Family/patient expects to be discharged to:: Private residence Living Arrangements: Spouse/significant other;Children Available Help at Discharge: Family Type of Home: House Home Access: Stairs to enter Entrance Stairs-Rails: None Entrance Stairs-Number of Steps: 1 Alternate Level Stairs-Number of Steps: full bath, half bath, bedroom on main level Home Layout: Two level;Able to live on main level with bedroom/bathroom;1/2 bath on main level Home Equipment: Cane - quad;Crutches (urinal) Additional Comments: Pt does not go upstairs in home. Brother can assist as well as family.    Prior Function            PT Goals (current goals can now be found in the care plan section) Acute Rehab PT Goals Patient Stated Goal: Be moving better and be able to go home PT Goal Formulation: With patient Time For Goal Achievement: 02/08/23 Potential to Achieve Goals: Good Progress towards PT goals: Progressing toward goals    Frequency    Min 1X/week      PT Plan      Co-evaluation              AM-PAC PT "6 Clicks" Mobility   Outcome Measure  Help needed turning from your back to your side while in a flat bed without using bedrails?: A Lot Help needed moving from lying on your back to sitting on the side of a flat bed without using bedrails?: Total Help needed moving to and from a bed to a chair (including a wheelchair)?: Total Help needed standing up from a chair using your arms (e.g., wheelchair or bedside chair)?: Total Help needed to walk in hospital room?: Total Help needed climbing 3-5 steps with a railing? : Total 6 Click Score: 7    End of Session Equipment Utilized During Treatment: Gait belt Activity Tolerance: Patient limited by pain;Patient limited by fatigue Patient left: with call bell/phone within reach;in chair;with chair alarm set Nurse Communication: Mobility status;Need for lift  equipment PT Visit Diagnosis: Muscle weakness (generalized) (M62.81);Pain;Unsteadiness on feet (R26.81);Difficulty in walking, not elsewhere classified (R26.2) Pain - Right/Left: Left Pain - part of body: Hip     Time: 1610-9604 PT Time Calculation (min) (ACUTE ONLY): 23 min  Charges:    $Gait Training: 8-22 mins $Therapeutic Activity: 8-22 mins PT General Charges $$ ACUTE PT VISIT: 1 Visit                     Johnny Bridge, PT Acute Rehab    Jacqualyn Posey 01/29/2023, 11:16 AM

## 2023-01-29 NOTE — Plan of Care (Signed)
  Problem: Education: Goal: Ability to describe self-care measures that may prevent or decrease complications (Diabetes Survival Skills Education) will improve Outcome: Progressing Goal: Individualized Educational Video(s) Outcome: Progressing   Problem: Coping: Goal: Ability to adjust to condition or change in health will improve Outcome: Progressing   Problem: Health Behavior/Discharge Planning: Goal: Ability to identify and utilize available resources and services will improve Outcome: Progressing Goal: Ability to manage health-related needs will improve Outcome: Progressing   Problem: Metabolic: Goal: Ability to maintain appropriate glucose levels will improve Outcome: Progressing   Problem: Skin Integrity: Goal: Risk for impaired skin integrity will decrease Outcome: Progressing   Problem: Education: Goal: Knowledge of General Education information will improve Description: Including pain rating scale, medication(s)/side effects and non-pharmacologic comfort measures Outcome: Progressing   Problem: Health Behavior/Discharge Planning: Goal: Ability to manage health-related needs will improve Outcome: Progressing   Problem: Clinical Measurements: Goal: Ability to maintain clinical measurements within normal limits will improve Outcome: Progressing   Problem: Activity: Goal: Risk for activity intolerance will decrease Outcome: Progressing   Problem: Elimination: Goal: Will not experience complications related to bowel motility Outcome: Progressing   Problem: Pain Managment: Goal: General experience of comfort will improve Outcome: Progressing   Problem: Safety: Goal: Ability to remain free from injury will improve Outcome: Progressing   Problem: Skin Integrity: Goal: Risk for impaired skin integrity will decrease Outcome: Progressing

## 2023-01-29 NOTE — TOC Progression Note (Addendum)
Transition of Care [facility]) - Progression Note    Patient Details  Name: Jerry Moran MRN: [id number] Date of Birth: 04-28-1955  [facility]) CM/SW Contact  Larrie Kass, LCSW Phone Number: 01/29/2023, 9:46 AM  Clinical Narrative:     Signed [facility] request form was faxed out to [facility] for review. TOC to follow.   Adden 2:09pm Received call from [place] [facility] requesting additional  information on pt's medications. Information was sent , pt's request is still pending. TOC to follow.   Expected Discharge Plan: Skilled Nursing Facility Barriers to Discharge: SNF Pending bed offer  Expected Discharge Plan and Services       Living arrangements for the past 2 months: Single Family Home                                       Social Determinants of Health (SDOH) Interventions SDOH Screenings   Food Insecurity: No Food Insecurity (01/22/2023)  Housing: Low Risk  (01/22/2023)  Transportation Needs: No Transportation Needs (01/22/2023)  Utilities: Not At Risk (01/22/2023)  Depression (PHQ2-9): Low Risk  (07/12/2021)  Tobacco Use: Medium Risk (01/24/2023)    Readmission Risk Interventions     No data to display

## 2023-01-29 NOTE — [place] FL2 (Signed)
[place] MEDICAID FL2 LEVEL OF CARE FORM     IDENTIFICATION  Patient Name: Jerry Moran Birthdate: February 04, 1956 Sex: male Admission Date (Current Location): 01/22/2023  [facility] and [place] Number:  Producer, television/film/video and Address:  [facility],  [address], [place] [address]      Provider Number: [id number]  Attending Physician Name and Address:  Dorcas Carrow, MD  Relative Name and Phone Number:  Brenden, Manes (Spouse)  [phone] (Mobile)    Current Level of Care: Hospital Recommended Level of Care: Skilled Nursing Facility Prior Approval Number:    Date Approved/Denied:   PASRR Number: [id number]  Discharge Plan: SNF    Current Diagnoses: Patient Active Problem List   Diagnosis Date Noted   CKD stage 3b, GFR 30-44 ml/min (HCC) 01/22/2023   Hip fracture, left, closed, initial encounter (HCC) 01/22/2023   Hip fracture (HCC) 01/22/2023   GI bleed 01/25/2022   Stage 3a chronic kidney disease (CKD) (HCC) 04/30/2021   Coronary artery disease 04/30/2021   Hyperlipidemia with target LDL less than 70 04/29/2021   Malignant neoplasm of prostate (HCC) 05/04/2020   Type 2 diabetes mellitus with hemoglobin A1c goal of less than 7.0% (HCC) 01/22/2019   Essential hypertension 01/22/2019    Orientation RESPIRATION BLADDER Height & Weight     Self, Time, Situation, Place  Normal Incontinent Weight: 190 lb (86.2 kg) Height:  5\' 8"  (172.7 cm)  BEHAVIORAL SYMPTOMS/MOOD NEUROLOGICAL BOWEL NUTRITION STATUS      Continent Diet (heart healthy/ carb mod)  AMBULATORY STATUS COMMUNICATION OF NEEDS Skin   Extensive Assist Verbally Other (Comment) (Closed incision- Left Hip)                       Personal Care Assistance Level of Assistance  Feeding, Bathing, Dressing Bathing Assistance: Limited assistance Feeding assistance: Independent Dressing Assistance: Limited assistance     Functional Limitations Info  Sight, Hearing, Speech Sight Info:  Adequate Hearing Info: Adequate Speech Info: Adequate    SPECIAL CARE FACTORS FREQUENCY  PT (By licensed PT), OT (By licensed OT)     PT Frequency: 5 x a week OT Frequency: 5 x a week            Contractures Contractures Info: Not present    Additional Factors Info  Code Status, Allergies Code Status Info: full Allergies Info: NKA           Current Medications (01/29/2023):  This is the current hospital active medication list Current Facility-Administered Medications  Medication Dose Route Frequency Provider Last Rate Last Admin   acetaminophen (TYLENOL) tablet 650 mg  650 mg Oral Q6H PRN Cecil Cobbs, [name]   650 mg at 01/28/23 8469   Or   acetaminophen (TYLENOL) suppository 650 mg  650 mg Rectal Q6H PRN Cecil Cobbs, [name]       albuterol (PROVENTIL) (2.5 MG/3ML) 0.083% nebulizer solution 2.5 mg  2.5 mg Inhalation Q4H PRN Kathie Dike M, [name]       amLODipine (NORVASC) tablet 10 mg  10 mg Oral Daily Kathie Dike M, [name]   10 mg at 01/29/23 1015   cholecalciferol (VITAMIN D3) 25 MCG (1000 UNIT) tablet 5,000 Units  5,000 Units Oral Daily Cecil Cobbs, [name]   5,000 Units at 01/29/23 1015   docusate sodium (COLACE) capsule 100 mg  100 mg Oral BID Cecil Cobbs, [name]   100 mg at 01/29/23 1015   ezetimibe (ZETIA) tablet 10 mg  10  mg Oral Daily Kathie Dike M, [name]   10 mg at 01/29/23 1018   hydrALAZINE (APRESOLINE) injection 10 mg  10 mg Intravenous Q6H PRN Cecil Cobbs, [name]   10 mg at 01/22/23 1651   HYDROmorphone (DILAUDID) injection 0.5-1 mg  0.5-1 mg Intravenous Q2H PRN Cecil Cobbs, [name]   1 mg at 01/27/23 1616   insulin aspart (novoLOG) injection 0-5 Units  0-5 Units Subcutaneous QHS Cecil Cobbs, [name]   3 Units at 01/28/23 2131   insulin aspart (novoLOG) injection 0-9 Units  0-9 Units Subcutaneous TID WC Cecil Cobbs, [name]   3 Units at 01/29/23 4098   insulin aspart (novoLOG) injection 3 Units  3 Units  Subcutaneous TID WC Dorcas Carrow, MD   3 Units at 01/28/23 1808   insulin glargine-yfgn (SEMGLEE) injection 10 Units  10 Units Subcutaneous Daily Dorcas Carrow, MD   10 Units at 01/29/23 1013   metoprolol succinate (TOPROL-XL) 24 hr tablet 25 mg  25 mg Oral Daily Kathie Dike M, [name]   25 mg at 01/29/23 1015   nitroGLYCERIN (NITROSTAT) SL tablet 0.4 mg  0.4 mg Sublingual Q5 min PRN Kathie Dike M, [name]       ondansetron [facility]) tablet 4 mg  4 mg Oral Q6H PRN Kathie Dike M, [name]       Or   ondansetron [facility]) injection 4 mg  4 mg Intravenous Q6H PRN Kathie Dike M, [name]   4 mg at 01/24/23 1119   oxybutynin (DITROPAN) tablet 2.5 mg  2.5 mg Oral Daily Kathie Dike M, [name]   2.5 mg at 01/29/23 1015   oxybutynin (DITROPAN) tablet 5 mg  5 mg Oral QHS Kathie Dike M, [name]   5 mg at 01/28/23 2126   oxyCODONE (Oxy IR/ROXICODONE) immediate release tablet 5 mg  5 mg Oral Q4H PRN Dorcas Carrow, MD   5 mg at 01/26/23 1304   polyethylene glycol (MIRALAX / GLYCOLAX) packet 17 g  17 g Oral Daily Kathie Dike M, [name]   17 g at 01/29/23 1015   potassium chloride SA (KLOR-CON M) CR tablet 40 mEq  40 mEq Oral BID Dorcas Carrow, MD   40 mEq at 01/29/23 1016   rosuvastatin (CRESTOR) tablet 10 mg  10 mg Oral [id number] Cecil Cobbs, [name]   10 mg at 01/28/23 1809   senna (SENOKOT) tablet 8.6 mg  1 tablet Oral BID Kathie Dike M, [name]   8.6 mg at 01/29/23 1015   sodium chloride flush (NS) 0.9 % injection 3 mL  3 mL Intravenous Q12H Kathie Dike M, [name]   3 mL at 01/29/23 1019   sodium chloride flush (NS) 0.9 % injection 3 mL  3 mL Intravenous PRN Kathie Dike M, [name]       sorbitol 70 % solution 30 mL  30 mL Oral Daily PRN Kathie Dike M, [name]       ticagrelor [facility]) tablet 90 mg  90 mg Oral BID Kathie Dike M, [name]   90 mg at 01/29/23 1015     Discharge Medications: Please see discharge summary for a list of discharge medications.  Relevant  Imaging Results:  Relevant Lab Results:   Additional Information [id number]  Valentina Shaggy Rozena Fierro, LCSW

## 2023-01-29 NOTE — Progress Notes (Signed)
PROGRESS NOTE    Julis Gannett  XLK:[id number] DOB: Jul 22, 1955 DOA: 01/22/2023 [facility]    Brief Narrative:  67 year old with past medical history significant for CAD ,non-STEMI 01/2022 status post DES to RCA residual multivessel CAD, stage IIIb creatinine baseline 2.0--2.4 presented after mechanical fall found to have left hip fracture.  Patient was brought [facility] for OR availability.  Underwent left total hip 10/23.  Patient is waiting to go to skilled nursing facility.  Subjective:  Seen and examined.  No new events.   Assessment & Plan:   Closed traumatic left hip fracture: Status post left total hip 10/23 Dr. Blanchie Dessert Weightbearing as tolerated left lower extremity DVT prophylaxis with  Brilinta that he uses at home. Postop follow-up orthopedics, dressing to keep intact until 2 weeks. Work with PT OT today.  Refer to SNF. Antibiotics were prescribed?  Patient is already 5 days postop in the hospital.  Will inquire with orthopedics.  Essential hypertension, uncontrolled: Currently better controlled on Norvasc, resumption of Cozaar and chlorthalidone.  Metoprolol.  Type 2 diabetes, uncontrolled with hyperglycemia.  Presented with blood sugars more than 400.  At home on glipizide and Jardiance.  Blood sugars still elevated.  Already on long-acting insulin 10 units.  Added prandial insulin.  Will discharge patient on insulin.  Hyperlipidemia, on Zetia and Lipitor. Coronary artery disease status post DES RCA in 2023.  Patient on Brilinta at home.  Back on Brilinta.   History of prostate cancer: On Flomax and oxybutynin. CKD stage IIIb: Creatinine at about baseline.  Potassium will be replaced.  DVT prophylaxis: SCDs Start: 01/23/23 0756   Code Status: Full code Family Communication: None today. Disposition Plan: Status is: Inpatient Remains inpatient appropriate because: [place] stable.  Waiting for SNF.     Consultants:   Orthopedics Cardiology  Procedures:  Left total hip  Antimicrobials:  Pre op       Objective: Vitals:   01/28/23 2122 01/29/23 0605 01/29/23 1012 01/29/23 1147  BP: (!) 145/70 (!) 144/76 (!) 154/83 138/88  Pulse: 82 80  83  Resp: 20 20 18 18   Temp: 98.2 F (36.8 C) 98.5 F (36.9 C)  98 F (36.7 C)  TempSrc: Oral Oral  Oral  SpO2: 97% 100% 98% 100%  Weight:      Height:        Intake/Output Summary (Last 24 hours) at 01/29/2023 1307 Last data filed at 01/29/2023 0815 Gross per 24 hour  Intake --  Output 1250 ml  Net -1250 ml   Filed Weights   01/24/23 0901  Weight: 86.2 kg    Examination:  General exam: Patient looks fairly comfortable.  Not in any distress. Respiratory system: Clear to auscultation. Respiratory effort normal. Cardiovascular system: S1 & S2 heard,  Extremities:  Left hip dressing intact.  Mild swelling present.  Minimal tenderness.  Distal neurovascular status intact.    Data Reviewed: I have personally reviewed following labs and imaging studies  CBC: Recent Labs  Lab 01/22/23 1340 01/23/23 0807 01/24/23 0433 01/24/23 0540 01/25/23 0430 01/29/23 0437  WBC 4.7 7.7 8.6 13.5* 11.9* 8.0  NEUTROABS 3.4  --   --   --   --  5.9  HGB 12.7* 12.0* 12.3* 12.8* 11.4* 9.5*  HCT 37.0* 34.2* 36.3* 37.9* 33.8* 28.6*  MCV 89.2 87.7 92.1 92.4 92.1 94.1  PLT 190 180 171 260 156 247   Basic Metabolic Panel: Recent Labs  Lab 01/23/23 0807 01/24/23 0433 01/24/23 0540 01/25/23  0430 01/29/23 0437  NA 136 132* 139 132* 136  K 3.3* 3.3* 4.3 3.8 3.2*  CL 101 100 107 99 105  CO2 22 23 21* 22 23  GLUCOSE 254* 217* 115* 276* 184*  BUN 20 27* 25* 36* 42*  CREATININE 2.41* 2.28* 0.86 2.69* 2.44*  CALCIUM 9.5 8.8* 8.4* 8.6* 8.4*   GFR: Estimated Creatinine Clearance: 30.5 mL/min (A) (by C-G formula based on SCr of 2.44 mg/dL (H)). Liver Function Tests: Recent Labs  Lab 01/22/23 1340 01/23/23 0807 01/25/23 0430  AST 18 13* 19  ALT 18 14  12   ALKPHOS 72 56 60  BILITOT 0.9 1.2 1.3*  PROT 7.1 6.5 6.6  ALBUMIN 3.5 2.9* 2.9*   No results for input(s): "LIPASE", "AMYLASE" in the last 168 hours. No results for input(s): "AMMONIA" in the last 168 hours. Coagulation Profile: No results for input(s): "INR", "PROTIME" in the last 168 hours. Cardiac Enzymes: No results for input(s): "CKTOTAL", "CKMB", "CKMBINDEX", "TROPONINI" in the last 168 hours. BNP (last 3 results) No results for input(s): "PROBNP" in the last 8760 hours. HbA1C: No results for input(s): "HGBA1C" in the last 72 hours.  CBG: Recent Labs  Lab 01/28/23 1145 01/28/23 1727 01/28/23 2124 01/29/23 0735 01/29/23 1144  GLUCAP 197* 249* 288* 202* 236*   Lipid Profile: No results for input(s): "CHOL", "HDL", "LDLCALC", "TRIG", "CHOLHDL", "LDLDIRECT" in the last 72 hours. Thyroid Function Tests: No results for input(s): "TSH", "T4TOTAL", "FREET4", "T3FREE", "THYROIDAB" in the last 72 hours. Anemia Panel: No results for input(s): "VITAMINB12", "FOLATE", "FERRITIN", "TIBC", "IRON", "RETICCTPCT" in the last 72 hours. Sepsis Labs: No results for input(s): "PROCALCITON", "LATICACIDVEN" in the last 168 hours.  Recent Results (from the past 240 hour(s))  Surgical pcr screen     Status: Abnormal   Collection Time: 01/23/23  7:44 AM   Specimen: Nasal Mucosa; Nasal Swab  Result Value Ref Range Status   MRSA, PCR NEGATIVE NEGATIVE Final   Staphylococcus aureus POSITIVE (A) NEGATIVE Final    Comment: (NOTE) The Xpert SA Assay (FDA approved for NASAL specimens in patients 65 years of age and older), is one component of a comprehensive surveillance program. It is not intended to diagnose infection nor to guide or monitor treatment. Performed at [facility], [address] [place], [place] [address]          Radiology Studies: No results found.      Scheduled Meds:  amLODipine  10 mg Oral Daily   cholecalciferol  5,000 Units Oral Daily    docusate sodium  100 mg Oral BID   ezetimibe  10 mg Oral Daily   insulin aspart  0-5 Units Subcutaneous QHS   insulin aspart  0-9 Units Subcutaneous TID WC   insulin aspart  3 Units Subcutaneous TID WC   insulin glargine-yfgn  10 Units Subcutaneous Daily   metoprolol succinate  25 mg Oral Daily   oxybutynin  2.5 mg Oral Daily   oxybutynin  5 mg Oral QHS   polyethylene glycol  17 g Oral Daily   potassium chloride  40 mEq Oral BID   rosuvastatin  10 mg Oral q1800   senna  1 tablet Oral BID   sodium chloride flush  3 mL Intravenous Q12H   ticagrelor  90 mg Oral BID   Continuous Infusions:   LOS: 7 days    Time spent: 35 minutes     Dorcas Carrow, MD [facility]

## 2023-01-30 DIAGNOSIS — S72002A Fracture of unspecified part of neck of left femur, initial encounter for closed fracture: Secondary | ICD-10-CM | POA: Diagnosis not present

## 2023-01-30 LAB — GLUCOSE, CAPILLARY
Glucose-Capillary: 175 mg/dL — ABNORMAL HIGH (ref 70–99)
Glucose-Capillary: 228 mg/dL — ABNORMAL HIGH (ref 70–99)
Glucose-Capillary: 235 mg/dL — ABNORMAL HIGH (ref 70–99)
Glucose-Capillary: 254 mg/dL — ABNORMAL HIGH (ref 70–99)

## 2023-01-30 MED ORDER — INSULIN ASPART 100 UNIT/ML IJ SOLN: 3.0000 [IU] | Freq: Three times a day (TID) | INTRAMUSCULAR | Status: AC

## 2023-01-30 MED ORDER — SENNA 8.6 MG PO TABS: 1.0000 | ORAL_TABLET | Freq: Two times a day (BID) | ORAL | Status: DC

## 2023-01-30 MED ORDER — INSULIN GLARGINE-YFGN 100 UNIT/ML ~~LOC~~ SOLN: 10.0000 [IU] | Freq: Every day | SUBCUTANEOUS | Status: AC

## 2023-01-30 MED ORDER — TICAGRELOR 90 MG PO TABS: 90.0000 mg | ORAL_TABLET | Freq: Two times a day (BID) | ORAL | Status: AC

## 2023-01-30 MED ORDER — POLYETHYLENE GLYCOL 3350 17 G PO PACK: 17.0000 g | PACK | Freq: Every day | ORAL | Status: AC

## 2023-01-30 NOTE — Plan of Care (Signed)
  Problem: Education: Goal: Ability to describe self-care measures that may prevent or decrease complications (Diabetes Survival Skills Education) will improve Outcome: Progressing   Problem: Coping: Goal: Ability to adjust to condition or change in health will improve Outcome: Progressing   Problem: Health Behavior/Discharge Planning: Goal: Ability to identify and utilize available resources and services will improve Outcome: Progressing   Problem: Metabolic: Goal: Ability to maintain appropriate glucose levels will improve Outcome: Progressing   Problem: Skin Integrity: Goal: Risk for impaired skin integrity will decrease Outcome: Progressing   Problem: Health Behavior/Discharge Planning: Goal: Ability to manage health-related needs will improve Outcome: Progressing   Problem: Clinical Measurements: Goal: Ability to maintain clinical measurements within normal limits will improve Outcome: Progressing   Problem: Activity: Goal: Risk for activity intolerance will decrease Outcome: Progressing

## 2023-01-30 NOTE — Progress Notes (Signed)
PROGRESS NOTE    Vahid Graessle  WGN:[id number] DOB: 1956-03-21 DOA: 01/22/2023 [facility]CP: [facility]    Brief Narrative:  67 year old with past medical history significant for CAD ,non-STEMI 01/2022 status post DES to RCA residual multivessel CAD, stage IIIb creatinine baseline 2.0--2.4 presented after mechanical fall found to have left hip fracture.  Patient was brought to [facility] long hospital for OR availability.  Underwent left total hip 10/23.  Patient is waiting to go to skilled nursing facility.  Subjective:  Patient seen and examined.  Pain is better controlled.  Waiting for SNF bed.   Assessment & Plan:   Closed traumatic left hip fracture: Status post left total hip 10/23 Dr. Blanchie Dessert Weightbearing as tolerated left lower extremity DVT prophylaxis with  Brilinta that he uses at home. Postop follow-up orthopedics, dressing to keep intact until 2 weeks. Continue with PT OT. Stable to transfer to SNF.  Essential hypertension, uncontrolled: Currently better controlled on Norvasc, resumption of Cozaar and chlorthalidone.  Metoprolol.  Type 2 diabetes, uncontrolled with hyperglycemia.  Presented with blood sugars more than 400.  At home on glipizide and Jardiance.  Blood sugars still elevated.  Already on long-acting insulin 10 units.  Added prandial insulin.  Will discharge patient on insulin.  Hyperlipidemia, on Zetia and Lipitor. Coronary artery disease status post DES RCA in 2023.  Patient on Brilinta at home.  Back on Brilinta.   History of prostate cancer: On Flomax and oxybutynin. CKD stage IIIb: Creatinine at about baseline.    DVT prophylaxis: SCDs Start: 01/23/23 0756   Code Status: Full code Family Communication: None today. Disposition Plan: Status is: Inpatient Remains inpatient appropriate because: Medically stable to transfer to SNF.  She was discharged from the      Consultants:  Orthopedics Cardiology  Procedures:  Left total  hip  Antimicrobials:  Pre op       Objective: Vitals:   01/29/23 1147 01/29/23 2136 01/30/23 0228 01/30/23 0644  BP: 138/88 (!) 144/79 (!) 149/83 (!) 160/97  Pulse: 83 76 74 82  Resp: 18 18 18 18   Temp: 98 F (36.7 C) 98.7 F (37.1 C) 97.9 F (36.6 C) 98.9 F (37.2 C)  TempSrc: Oral Oral Oral Oral  SpO2: 100% 98% 98% 98%  Weight:      Height:        Intake/Output Summary (Last 24 hours) at 01/30/2023 1404 Last data filed at 01/30/2023 0946 Gross per 24 hour  Intake 753 ml  Output 825 ml  Net -72 ml   Filed Weights   01/24/23 0901  Weight: 86.2 kg    Examination:  General exam: Patient looks fairly comfortable.  Not in any distress. Patient is sitting comfortably in chair.  Okay Respiratory system: Clear to auscultation. Respiratory effort normal. Cardiovascular system: S1 & S2 heard,  Extremities:  Left hip dressing intact.  Mild swelling present.  Minimal tenderness.  Distal neurovascular status intact.    Data Reviewed: I have personally reviewed following labs and imaging studies  CBC: Recent Labs  Lab 01/24/23 0433 01/24/23 0540 01/25/23 0430 01/29/23 0437  WBC 8.6 13.5* 11.9* 8.0  NEUTROABS  --   --   --  5.9  HGB 12.3* 12.8* 11.4* 9.5*  HCT 36.3* 37.9* 33.8* 28.6*  MCV 92.1 92.4 92.1 94.1  PLT 171 260 156 247   Basic Metabolic Panel: Recent Labs  Lab 01/24/23 0433 01/24/23 0540 01/25/23 0430 01/29/23 0437  NA 132* 139 132* 136  K 3.3* 4.3 3.8 3.2*  CL 100 107 99 105  CO2 23 21* 22 23  GLUCOSE 217* 115* 276* 184*  BUN 27* 25* 36* 42*  CREATININE 2.28* 0.86 2.69* 2.44*  CALCIUM 8.8* 8.4* 8.6* 8.4*   GFR: Estimated Creatinine Clearance: 30.5 mL/min (A) (by C-G formula based on SCr of 2.44 mg/dL (H)). Liver Function Tests: Recent Labs  Lab 01/25/23 0430  AST 19  ALT 12  ALKPHOS 60  BILITOT 1.3*  PROT 6.6  ALBUMIN 2.9*   No results for input(s): "LIPASE", "AMYLASE" in the last 168 hours. No results for input(s): "AMMONIA"  in the last 168 hours. Coagulation Profile: No results for input(s): "INR", "PROTIME" in the last 168 hours. Cardiac Enzymes: No results for input(s): "CKTOTAL", "CKMB", "CKMBINDEX", "TROPONINI" in the last 168 hours. BNP (last 3 results) No results for input(s): "PROBNP" in the last 8760 hours. HbA1C: No results for input(s): "HGBA1C" in the last 72 hours.  CBG: Recent Labs  Lab 01/29/23 1144 01/29/23 1650 01/29/23 2133 01/30/23 0722 01/30/23 1216  GLUCAP 236* 264* 141* 175* 228*   Lipid Profile: No results for input(s): "CHOL", "HDL", "LDLCALC", "TRIG", "CHOLHDL", "LDLDIRECT" in the last 72 hours. Thyroid Function Tests: No results for input(s): "TSH", "T4TOTAL", "FREET4", "T3FREE", "THYROIDAB" in the last 72 hours. Anemia Panel: No results for input(s): "VITAMINB12", "FOLATE", "FERRITIN", "TIBC", "IRON", "RETICCTPCT" in the last 72 hours. Sepsis Labs: No results for input(s): "PROCALCITON", "LATICACIDVEN" in the last 168 hours.  Recent Results (from the past 240 hour(s))  Surgical pcr screen     Status: Abnormal   Collection Time: 01/23/23  7:44 AM   Specimen: Nasal Mucosa; Nasal Swab  Result Value Ref Range Status   MRSA, PCR NEGATIVE NEGATIVE Final   Staphylococcus aureus POSITIVE (A) NEGATIVE Final    Comment: (NOTE) The Xpert SA Assay (FDA approved for NASAL specimens in patients 76 years of age and older), is one component of a comprehensive surveillance program. It is not intended to diagnose infection nor to guide or monitor treatment. Performed at [facility], [address] [place], [place] [address]          Radiology Studies: No results found.      Scheduled Meds:  amLODipine  10 mg Oral Daily   cholecalciferol  5,000 Units Oral Daily   docusate sodium  100 mg Oral BID   ezetimibe  10 mg Oral Daily   insulin aspart  0-5 Units Subcutaneous QHS   insulin aspart  0-9 Units Subcutaneous TID WC   insulin aspart  3 Units Subcutaneous TID  WC   insulin glargine-yfgn  10 Units Subcutaneous Daily   metoprolol succinate  25 mg Oral Daily   oxybutynin  2.5 mg Oral Daily   oxybutynin  5 mg Oral QHS   polyethylene glycol  17 g Oral Daily   potassium chloride  40 mEq Oral BID   rosuvastatin  10 mg Oral q1800   senna  1 tablet Oral BID   sodium chloride flush  3 mL Intravenous Q12H   ticagrelor  90 mg Oral BID   Continuous Infusions:   LOS: 8 days    Time spent: 35 minutes     Dorcas Carrow, MD [facility]

## 2023-01-30 NOTE — Plan of Care (Signed)
  Problem: Education: Goal: Ability to describe self-care measures that may prevent or decrease complications (Diabetes Survival Skills Education) will improve Outcome: Progressing Goal: Individualized Educational Video(s) Outcome: Progressing   Problem: Coping: Goal: Ability to adjust to condition or change in health will improve Outcome: Progressing   Problem: Health Behavior/Discharge Planning: Goal: Ability to identify and utilize available resources and services will improve Outcome: Progressing Goal: Ability to manage health-related needs will improve Outcome: Progressing   Problem: Metabolic: Goal: Ability to maintain appropriate glucose levels will improve Outcome: Progressing   Problem: Skin Integrity: Goal: Risk for impaired skin integrity will decrease Outcome: Progressing   Problem: Education: Goal: Knowledge of General Education information will improve Description: Including pain rating scale, medication(s)/side effects and non-pharmacologic comfort measures Outcome: Progressing   Problem: Health Behavior/Discharge Planning: Goal: Ability to manage health-related needs will improve Outcome: Progressing   Problem: Clinical Measurements: Goal: Ability to maintain clinical measurements within normal limits will improve Outcome: Progressing   Problem: Activity: Goal: Risk for activity intolerance will decrease Outcome: Progressing   Problem: Elimination: Goal: Will not experience complications related to bowel motility Outcome: Progressing   Problem: Pain Managment: Goal: General experience of comfort will improve Outcome: Progressing   Problem: Safety: Goal: Ability to remain free from injury will improve Outcome: Progressing   Problem: Skin Integrity: Goal: Risk for impaired skin integrity will decrease Outcome: Progressing

## 2023-01-30 NOTE — TOC Progression Note (Addendum)
Transition of Care [facility]) - Progression Note    Patient Details  Name: Jerry Moran MRN: [id number] Date of Birth: [birth year]  [facility] CM/SW Contact  Larrie Kass, LCSW Phone Number: 01/30/2023, 10:58 AM  Clinical Narrative:    Pt was accepted into [facility] rehab center. CSW presented bed offer , pt would like to discuss with his family before accepting bed. TOC to follow.     1:30pm  Pt has accepted bed at [facility] in [place]. Per [facility] SW, pt does not qualify for transport.     CSW has arrange Psychologist, educational to transport pt to [facility] in [place], [place]. Pt will need Wheelchair and to be ready by 8am. RN and MD made aware. RN to call report to [phone] [id number] Pt [address]. TOC to follow.   Expected Discharge Plan: Skilled Nursing Facility Barriers to Discharge: SNF Pending bed offer  Expected Discharge Plan and Services       Living arrangements for the past 2 months: Single Family Home                                       Social Determinants of Health (SDOH) Interventions SDOH Screenings   Food Insecurity: No Food Insecurity (01/22/2023)  Housing: Low Risk  (01/22/2023)  Transportation Needs: No Transportation Needs (01/22/2023)  Utilities: Not At Risk (01/22/2023)  Depression (PHQ2-9): Low Risk  (07/12/2021)  Tobacco Use: Medium Risk (01/24/2023)    Readmission Risk Interventions     No data to display

## 2023-01-30 NOTE — Progress Notes (Addendum)
Discharge plan after speaking with Social worker, Donato Schultz, is that patient will discharge to the [facility] in [place] tomorrow morning as early as 0800. Safe transport will be transporting him and will call the nurses desk to let them know when they have arrived.  The phone number to call report to the [facility] is [phone]  Ext. [phone] . Report should be called morning of transport.
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
## 2023-01-30 NOTE — Plan of Care (Signed)
  Problem: Education: Goal: Ability to describe self-care measures that may prevent or decrease complications (Diabetes Survival Skills Education) will improve Outcome: Progressing Goal: Individualized Educational Video(s) Outcome: Progressing   

## 2023-01-30 NOTE — Progress Notes (Signed)
Occupational Therapy Treatment Patient Details Name: Jerry Moran MRN: [id number] DOB: 10/20/1955 Today's Date: 01/30/2023   History of present illness Pt is a 67 year old male presenting after mechanical fall found to have left displaced femoral neck fracture s/p L posterior THA on 01/24/23. PMH significant for CAD non-STEMI 01/2022 status post DES to RCA residual multivessel CAD, stage IIIb creatinine baseline 2.0--2.4, DM, HTN, HLD, and CVA.   OT comments  The pt presented with improved pain and overall activity tolerance today. He required assist for sit to stand using a RW, simulated lower body dressing, and to transfer from the chair to bed using a RW.  He is making functional progress, however post-acute rehab services continue to be recommended to maximize his safety and independence with self-care tasks. Patient will benefit from continued inpatient follow up therapy, <3 hours/day.       If plan is discharge home, recommend the following:  A lot of help with bathing/dressing/bathroom;Assistance with cooking/housework;Assist for transportation;Help with stairs or ramp for entrance;A lot of help with walking and/or transfers   Equipment Recommendations  Other (comment) (defer to next level of care)    Recommendations for Other Services      Precautions / Restrictions Precautions Precautions: Fall Precaution Comments: WBAT LLE, No formal hip precautions       Mobility Bed Mobility Overal bed mobility: Needs Assistance             General bed mobility comments: pt was received seated in the bedside chair    Transfers Overall transfer level: Needs assistance Equipment used: Rolling walker (2 wheels) Transfers: Sit to/from Stand Sit to Stand: Mod assist           General transfer comment: required cues for hand placement and upright posture in standing         ADL either performed or assessed with clinical judgement   ADL Overall ADL's : Needs  assistance/impaired Eating/Feeding: Independent;Bed level Eating/Feeding Details (indicate cue type and reason): based on clinical judgement Grooming: Set up;Sitting Grooming Details (indicate cue type and reason): at chair level         Upper Body Dressing : Set up;Sitting   Lower Body Dressing: Moderate assistance Lower Body Dressing Details (indicate cue type and reason): for sock management with pt seated EOB                      Cognition Arousal: Alert Behavior During Therapy: WFL for tasks assessed/performed Overall Cognitive Status: Within Functional Limits for tasks assessed                      Pertinent Vitals/ Pain       Pain Assessment Pain Assessment: 0-10 Pain Score: 3  Pain Location: L hip Pain Intervention(s): Limited activity within patient's tolerance, Monitored during session         Frequency  Min 1X/week        Progress Toward Goals  OT Goals(current goals can now be found in the care plan section)     Acute Rehab OT Goals Patient Stated Goal: to be able to walk OT Goal Formulation: With patient Time For Goal Achievement: 02/09/23 Potential to Achieve Goals: Good  Plan         AM-PAC OT "6 Clicks" Daily Activity     Outcome Measure   Help from another person eating meals?: None Help from another person taking care of personal grooming?: A Little Help from another person toileting,  which includes using toliet, bedpan, or urinal?: A Lot Help from another person bathing (including washing, rinsing, drying)?: A Lot Help from another person to put on and taking off regular upper body clothing?: A Little Help from another person to put on and taking off regular lower body clothing?: A Lot 6 Click Score: 16    End of Session Equipment Utilized During Treatment: Gait belt;Rolling walker (2 wheels)  OT Visit Diagnosis: Other abnormalities of gait and mobility (R26.89);Muscle weakness (generalized) (M62.81);Unsteadiness on  feet (R26.81);Pain Pain - Right/Left: Left Pain - part of body: Hip   Activity Tolerance Patient tolerated treatment well   Patient Left in bed;with call bell/phone within reach   Nurse Communication Mobility status        Time: 5621-3086 OT Time Calculation (min): 22 min  Charges: OT General Charges $OT Visit: 1 Visit OT Treatments $Therapeutic Activity: 8-22 mins      Reuben Likes, OTR/L 01/30/2023, 5:43 PM

## 2023-01-31 ENCOUNTER — Telehealth: Payer: Self-pay | Admitting: Internal Medicine

## 2023-01-31 LAB — GLUCOSE, CAPILLARY: Glucose-Capillary: 170 mg/dL — ABNORMAL HIGH (ref 70–99)

## 2023-01-31 NOTE — Progress Notes (Signed)
Telephone report given to Diamantina Monks, Rn at [facility] at 0730.  Report included:  Medications, lab results, Vital signs, last BM, skin assessment, and patient's current pain score.

## 2023-01-31 NOTE — Telephone Encounter (Signed)
Note has been faxed.

## 2023-01-31 NOTE — Telephone Encounter (Signed)
Pearl with the [facility] asked for OV notes with pharmD from 01/04/23 be faxed over to number below  Fax: [phone]

## 2023-02-22 NOTE — Telephone Encounter (Signed)
Jerry Moran  You31 minutes ago (1:09 PM)   AS [facility], he [facility] to be [facility] by them. He didn't want to pay a copay, will contact me when he gets the [facility] referral.

## 2023-03-20 ENCOUNTER — Ambulatory Visit: Payer: No Typology Code available for payment source | Admitting: Nurse Practitioner

## 2023-04-21 NOTE — Progress Notes (Unsigned)
Cardiology Office Note:   Date:  04/26/2023  ID:  Jerry Moran, DOB 03/04/1956, MRN [id number] PCP:  Clinic, Jerry Moran  [facility] Providers Cardiologist:  Jerry Skeans, MD Referring MD: Clinic, Jerry Moran   Chief Complaint/Reason for Referral: Cardiology follow-up ASSESSMENT:    1. Coronary artery disease involving native coronary artery of native heart, unspecified whether angina present   2. Type 2 diabetes mellitus with hemoglobin A1c goal of less than 7.0% (HCC)   3. Hypertension associated with diabetes (HCC)   4. Hyperlipidemia associated with type 2 diabetes mellitus (HCC)   5. Stage 3b chronic kidney disease (HCC)   6. BMI 27.0-27.9,adult      PLAN:   In order of problems listed above: 1.  Coronary artery disease: Continue indefinite Brilinta monotherapy.  No need for beta-blocker given preserved LV function.   2.  Type 2 diabetes: Continue Brilinta instead of aspirin, Lipitor, Jardiance, and losartan.   3.  Hypertension:   Increase losartan to 50 mg and check BMP next week 4.  Hyperlipidemia: Patient is on Crestor as a retrial; check lipid panel, LFTs, LP(a) next week.   5.  Stage III chronic kidney disease: Continue Jardiance and losartan for renal protection. 6.  Elevated BMI: Had seen pharmacy and Ozempic was recommended; patient deferred ozempic.       Dispo:  Return in about 6 months (around 10/24/2023).    Medication Adjustments/Labs and Tests Ordered: Current medicines are reviewed at length with the patient today.  Concerns regarding medicines are outlined above.  The following changes have been made:     Labs/tests ordered: Orders Placed This Encounter  Procedures   Lipid panel   Lipoprotein A (LPA)   Hepatic function panel   Basic metabolic panel    Medication Changes: Meds ordered this encounter  Medications   losartan (COZAAR) 50 MG tablet    Sig: Take 1 tablet (50 mg total) by mouth daily.    Dispense:  90 tablet     Refill:  3    Dose change (increased).    Current medicines are reviewed at length with the patient today.  The patient does not have concerns regarding medicines.  I spent 33 minutes reviewing all clinical data during and prior to this visit including all relevant imaging studies, laboratories, clinical information from other health systems and prior notes from both Cardiology and other specialties, interviewing the patient, conducting a complete physical examination, and coordinating care in order to formulate a comprehensive and personalized evaluation and treatment plan.   History of Present Illness:   FOCUSED PROBLEM LIST:   Acute coronary syndrome status post PCI of right coronary artery with moderate residual LAD, left circumflex, and obtuse marginal disease January 2023 Type 2 diabetes  Not on insulin Hyperlipidemia Intolerant of atorvastatin due to brain fog Hypertension BMI of 27 CKD stage III History of stroke in 2009 and 2020 (due to small vessel disease) Right bundle branch block  9/24: The patient is a 68 y.o. male with the indicated medical history here for routine cardiology follow-up.  He has been followed by Dr. Verdis Moran for his cardiology care until recently.  He was last seen in our clinic in March of this year.  At that time he was doing well.  He specifically denied any cardiovascular complaints.  In the interim since he was seen last his atorvastatin was stopped by his [place] provider due to brain fog.  He was started on Zetia 10 mg.  He  is also stopped taking his Lasix.  He denies any increasing shortness of breath or peripheral edema.  He denies any exertional angina.  He has had no recurrent signs or symptoms of stroke.  He has been checking his blood pressures at home and unfortunately they remain elevated in the 140s to 160s.  He has been taking his medications faithfully without any missed doses.  He fortunately has not required any emergency room visits or  hospitalizations recently.  He was last seen in the emergency department in February due to low blood pressure.  Plan stop beta-blocker given preserved LV function; start losartan and check BMP in 1 week.;  Referred to pharmacy for GLP-1 receptor agonist therapy.  1/25: In the interim the patient suffered a hip fracture in October and underwent surgery without complications.  He was seen by pharmacy and rosuvastatin was started and Ozempic was recommended.  The patient deferred the Ozempic.  He has been doing well.  He is recuperating from his left hip fracture.  He is walking with a cane and participating with physical therapy.  He does have a home health nurse that comes and checks his blood pressure.  It is typically elevated.  He denies any other cardiovascular symptoms.      Current Medications: Current Meds  Medication Sig   acetaminophen (TYLENOL) 500 MG tablet Take 500 mg by mouth every 8 (eight) hours as needed for moderate pain (pain score 4-6).   albuterol (VENTOLIN HFA) 108 (90 Base) MCG/ACT inhaler Inhale 2 puffs into the lungs every 4 (four) hours as needed (COPD).   amLODipine (NORVASC) 10 MG tablet Take 1 tablet (10 mg total) by mouth daily.   ascorbic acid (VITAMIN C) 250 MG tablet Take 250 mg by mouth daily.   blood glucose meter kit and supplies KIT Dispense based on patient and insurance preference. Use up to four times daily as directed. (FOR ICD-9 250.00, 250.01).   Cholecalciferol (VITAMIN D3) 125 MCG (5000 UT) CAPS Take 1 capsule by mouth daily.   empagliflozin (JARDIANCE) 10 MG TABS tablet Take 1 tablet (10 mg total) by mouth daily before breakfast.   ezetimibe (ZETIA) 10 MG tablet Take 10 mg by mouth daily.   ferrous sulfate 325 (65 FE) MG tablet Take 325 mg by mouth See admin instructions. Take 1 capsule by mouth on Monday,Tuesday, Wednesday and Thursdays per wife   furosemide (LASIX) 20 MG tablet Take 20 mg by mouth daily as needed for edema.   glipiZIDE (GLUCOTROL) 5  MG tablet Take 1 tablet (5 mg total) by mouth daily.   lidocaine (XYLOCAINE) 5 % ointment Apply 1 Application topically 2 (two) times daily as needed for mild pain (pain score 1-3). To painful area on feet   losartan (COZAAR) 50 MG tablet Take 1 tablet (50 mg total) by mouth daily.   metoprolol succinate (TOPROL-XL) 50 MG 24 hr tablet Take 1.5 tablets (75 mg total) by mouth daily. (Patient taking differently: Take 25 mg by mouth daily.)   nitroGLYCERIN (NITROSTAT) 0.4 MG SL tablet Place 1 tablet (0.4 mg total) under the tongue every 5 (five) minutes as needed for chest pain.   oxybutynin (DITROPAN) 5 MG tablet Take 7.5 mg by mouth See admin instructions. 1/2 tab in the morning, 1 tablet at bedtime   polyethylene glycol (MIRALAX / GLYCOLAX) 17 g packet Take 17 g by mouth daily.   senna (SENOKOT) 8.6 MG TABS tablet Take 1 tablet (8.6 mg total) by mouth 2 (two) times daily.  ticagrelor (BRILINTA) 90 MG TABS tablet Take 1 tablet (90 mg total) by mouth 2 (two) times daily.   [DISCONTINUED] losartan (COZAAR) 25 MG tablet Take 1 tablet (25 mg total) by mouth daily.     Allergies:    Patient has no known allergies.   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    All other systems reviewed and are negative.     EKGs/Labs/Other Test Reviewed:   EKG: EKG performed October 2024 that I personally reviewed demonstrates sinus rhythm and a right bundle branch block  EKG  Interpretation Date/Time:    Ventricular Rate:    PR Interval:    QRS Duration:    QT Interval:    QTC Calculation:   R Axis:      Text Interpretation:          Prior CV studies reviewed: Cardiac Studies & Procedures   CARDIAC CATHETERIZATION  CARDIAC CATHETERIZATION 04/29/2021  Narrative   Dist RCA lesion is 99% stenosed.   1st Mrg-1 lesion is 60% stenosed.   Prox Cx to Mid Cx lesion is 40% stenosed.   1st Mrg-2 lesion is 80% stenosed.   Lat 1st Mrg lesion is 80% stenosed.   Prox LAD to Mid LAD lesion is 50% stenosed.   A drug-eluting stent was successfully placed using a STENT ONYX FRONTIER 2.5X12.   Post intervention, there is a 0% residual stenosis.  The LAD is a large caliber vessel that courses to the apex. The mid LAD has moderate stenosis. RFR 0.92 suggesting this lesion is not flow limiting. The Circumflex is a large vessel. The first obtuse marginal branch is a large bifurcating vessel. The proximal segment of the obtuse marginal branch has a moderate stenosis. The vessel then bifurcates. There is diffuse severe disease in both distal sub-branches of the obtuse marginal branch. The RCA is a large dominant vessel with severe stenosis just before the bifurcation. This is felt to be the culprit lesion. Successful PTCA/DES x 1 distal RCA Mild elevation LV filling pressures  Recommendations: DAPT with ASA and Brilinta for one year. High intensity statin. Beta blocker as tolerated. Echo is pending. Recheck renal function tomorrow. Medical management of residual CAD in the Circumflex artery. Case reviewed with IC team.  Findings Coronary Findings Diagnostic  Dominance: Right  Left Anterior Descending Prox LAD to Mid LAD lesion is 50% stenosed.  Left Circumflex Vessel is large. Prox Cx to Mid Cx lesion is 40% stenosed.  First Obtuse Marginal Branch Vessel is large in size. 1st Mrg-1 lesion is 60% stenosed. 1st Mrg-2 lesion is 80% stenosed.  Lateral First Obtuse  Marginal Branch Lat 1st Mrg lesion is 80% stenosed.  Right Coronary Artery Vessel is large. Dist RCA lesion is 99% stenosed.  Intervention  Dist RCA lesion Stent CATH LAUNCHER 6FR AL.75 guide  catheter was inserted. Lesion crossed with guidewire using a WIRE COUGAR XT STRL 190CM. Pre-stent angioplasty was performed using a BALLN SAPPHIRE 2.0X12. A drug-eluting stent was successfully placed using a STENT ONYX FRONTIER 2.5X12. Stent strut is well apposed. Post-stent angioplasty was performed using a BALLN Dix EUPHORA RX 2.75X8. Post-Intervention Lesion Assessment The intervention was successful. Pre-interventional TIMI flow is 3. Post-intervention TIMI flow is 3. No complications occurred at this lesion. There is a 0% residual stenosis post intervention.    ECHOCARDIOGRAM  ECHOCARDIOGRAM COMPLETE 04/29/2021  Narrative ECHOCARDIOGRAM REPORT    Patient Name:   KYSEEM LYNSKEY Date of Exam: 04/29/2021 Medical Rec #:  [id number]         Height:       68.0 in Accession #:    [id number]        Weight:       175.0 lb Date of Birth:  June 17, 1955         BSA:          1.931 m Patient Age:    45 years          BP:           176/88 mmHg Patient Gender: M                 HR:           74 bpm. Exam Location:  Inpatient  Procedure: 2D Echo and Strain Analysis  Indications:    acute myocardial infarction  History:        Patient has prior history of Echocardiogram examinations, most recent 01/14/2019. Risk Factors:Dyslipidemia.  Sonographer:    Delcie Roch RDCS Referring Phys: [id number] DENNIS NARCISSE JR  IMPRESSIONS   1. Left ventricular ejection fraction, by estimation, is 60 to 65%. The left ventricle has normal function. The left ventricle has no regional wall motion abnormalities. Left ventricular diastolic parameters are indeterminate. Elevated left ventricular end-diastolic pressure. 2. Right ventricular systolic function is normal. The right ventricular size is normal.  Tricuspid regurgitation signal is inadequate for assessing PA pressure. 3. The mitral valve is normal in structure. Mild mitral valve regurgitation. No evidence of mitral stenosis. 4. The aortic valve is normal in structure. Aortic valve regurgitation is trivial. No aortic stenosis is present. 5. The inferior vena cava is normal in size with greater than 50% respiratory variability, suggesting right atrial pressure of 3 mmHg.  FINDINGS Left Ventricle: Left ventricular ejection fraction, by estimation, is 60 to 65%. The left ventricle has normal function. The left ventricle has no regional wall motion abnormalities. Global longitudinal strain performed but not reported based on interpreter judgement due to suboptimal tracking. The left ventricular internal cavity size was normal in size. There is no left ventricular hypertrophy. Left ventricular diastolic parameters are indeterminate. Elevated left ventricular end-diastolic pressure.  Right Ventricle: The right ventricular size is normal. No increase in right ventricular wall thickness. Right ventricular systolic function is normal. Tricuspid regurgitation signal is inadequate for assessing PA pressure.  Left Atrium: Left atrial size was normal in size.  Right Atrium: Right atrial size was normal in size.  Pericardium: There is no evidence of pericardial effusion.  Mitral Valve: The mitral valve is normal in structure. Mild mitral valve regurgitation. No evidence of mitral valve stenosis.  Tricuspid Valve: The tricuspid valve is normal in structure. Tricuspid valve regurgitation is trivial. No evidence of tricuspid stenosis.  Aortic Valve: The aortic valve is normal in structure. Aortic valve regurgitation is trivial. No aortic  stenosis is present.  Pulmonic Valve: The pulmonic valve was normal in structure. Pulmonic valve regurgitation is trivial. No evidence of pulmonic stenosis.  Aorta: The aortic root is normal in size and  structure.  Venous: The inferior vena cava is normal in size with greater than 50% respiratory variability, suggesting right atrial pressure of 3 mmHg.  IAS/Shunts: No atrial level shunt detected by color flow Doppler.   LEFT VENTRICLE PLAX 2D LVIDd:         5.00 cm   Diastology LVIDs:         3.40 cm   LV e' medial:    5.77 cm/s LV PW:         1.30 cm   LV E/e' medial:  17.5 LV IVS:        1.00 cm   LV e' lateral:   5.66 cm/s LVOT diam:     2.10 cm   LV E/e' lateral: 17.8 LV SV:         69 LV SV Index:   36 LVOT Area:     3.46 cm   RIGHT VENTRICLE             IVC RV S Moran:     11.50 cm/s  IVC diam: 1.80 cm TAPSE (M-mode): 2.5 cm  LEFT ATRIUM             Index        RIGHT ATRIUM           Index LA diam:        3.70 cm 1.92 cm/m   RA Area:     13.70 cm LA Vol (A2C):   51.5 ml 26.67 ml/m  RA Volume:   33.00 ml  17.09 ml/m LA Vol (A4C):   54.1 ml 28.02 ml/m LA Biplane Vol: 54.4 ml 28.17 ml/m AORTIC VALVE LVOT Vmax:   95.40 cm/s LVOT Vmean:  60.900 cm/s LVOT VTI:    0.200 m  AORTA Ao Root diam: 3.00 cm Ao Asc diam:  3.40 cm  MITRAL VALVE MV Area (PHT): 4.80 cm     SHUNTS MV Decel Time: 158 msec     Systemic VTI:  0.20 m MR Peak grad: 103.6 mmHg    Systemic Diam: 2.10 cm MR Mean grad: 70.0 mmHg MR Vmax:      509.00 cm/s MR Vmean:     400.0 cm/s MV E velocity: 101.00 cm/s MV A velocity: 82.50 cm/s MV E/A ratio:  1.22  Armanda Magic MD Electronically signed by Armanda Magic MD Signature Date/Time: 04/29/2021/4:46:45 PM    Final   MONITORS  LONG TERM MONITOR (3-14 DAYS) 11/11/2021  Narrative   Basic underlying rhythm is normal sinus rhythm with average heart rate 73 bpm.  (Heart rate range 50 to to 207 bpm)   4 nonsustained runs of ventricular tachycardia with longest lasting 12 seconds at a rate of 130 bpm.  Fastest at 207 bpm.   For supraventricular tachycardia runs with longest lasting 13.6 seconds at 117 bpm.  Fastest SVT 174 bpm.   Low PVC  burden.   Patch Wear Time:  14 days and 0 hours (2023-07-23T08:44:57-0400 to 2023-08-06T08:45:04-398)  Patient had a min HR of 52 bpm, max HR of 207 bpm, and avg HR of 73 bpm. Predominant underlying rhythm was Sinus Rhythm. Bundle Branch Block/IVCD was present. 4 Ventricular Tachycardia runs occurred, the run with the fastest interval lasting 4 beats with a max rate of 207 bpm, the longest lasting 11.5 secs  with an avg rate of 130 bpm. 4 Supraventricular Tachycardia runs occurred, the run with the fastest interval lasting 7 beats with a max rate of 174 bpm, the longest lasting 13.6 secs with an avg rate of 117 bpm. Idioventricular Rhythm was present. Isolated SVEs were rare (<1.0%), SVE Couplets were rare (<1.0%), and SVE Triplets were rare (<1.0%). Isolated VEs were rare (<1.0%, 254), VE Couplets were rare (<1.0%, 2), and VE Triplets were rare (<1.0%, 1). Inverted QRS complexes possibly due to inverted placement of device.           Recent Labs: 01/25/2023: ALT 12 01/29/2023: BUN 42; Creatinine, Ser 2.44; Hemoglobin 9.5; Platelets 247; Potassium 3.2; Sodium 136   Lipid Panel    Component Value Date/Time   CHOL 148 04/29/2021 0213   TRIG 83 04/29/2021 0213   HDL 40 (L) 04/29/2021 0213   CHOLHDL 3.7 04/29/2021 0213   VLDL 17 04/29/2021 0213   LDLCALC 91 04/29/2021 0213   LDLDIRECT 168 (H) 01/04/2023 1615    Risk Assessment/Calculations:          Physical Exam:   VS:  BP (!) 160/80   Pulse 62   Ht 5\' 6"  (1.676 m)   SpO2 99%   BMI 30.67 kg/m    HYPERTENSION CONTROL Vitals:   04/26/23 0959 04/26/23 1031  BP: (!) 142/62 (!) 160/80    The patient's blood pressure is elevated above target today.  In order to address the patient's elevated BP:       Wt Readings from Last 3 Encounters:  01/24/23 190 lb (86.2 kg)  12/22/22 187 lb (84.8 kg)  06/27/22 183 lb 3.2 oz (83.1 kg)      GENERAL:  No apparent distress, AOx3 HEENT:  No carotid bruits, +2 carotid impulses, no  scleral icterus CAR: RRR no murmurs, gallops, rubs, or thrills RES:  Clear to auscultation bilaterally ABD:  Soft, nontender, nondistended, positive bowel sounds x 4 VASC:  +2 radial pulses, +2 carotid pulses NEURO:  CN 2-12 grossly intact; motor and sensory grossly intact PSYCH:  No active depression or anxiety EXT:  No edema, ecchymosis, or cyanosis  Signed, Orbie Pyo, MD  04/26/2023 11:22 AM    [facility] [address] [place], [place], [place]  [address] Phone: [phone]; Fax: [phone]   Note:  This document was prepared using Dragon voice recognition software and may include unintentional dictation errors.

## 2023-04-26 ENCOUNTER — Encounter: Payer: Self-pay | Admitting: Internal Medicine

## 2023-04-26 ENCOUNTER — Ambulatory Visit: Payer: No Typology Code available for payment source | Attending: Internal Medicine | Admitting: Internal Medicine

## 2023-04-26 VITALS — BP 160/80 | HR 62 | Ht 66.0 in

## 2023-04-26 DIAGNOSIS — I2510 Atherosclerotic heart disease of native coronary artery without angina pectoris: Secondary | ICD-10-CM | POA: Diagnosis not present

## 2023-04-26 DIAGNOSIS — E1169 Type 2 diabetes mellitus with other specified complication: Secondary | ICD-10-CM

## 2023-04-26 DIAGNOSIS — N1832 Chronic kidney disease, stage 3b: Secondary | ICD-10-CM

## 2023-04-26 DIAGNOSIS — Z6827 Body mass index (BMI) 27.0-27.9, adult: Secondary | ICD-10-CM

## 2023-04-26 DIAGNOSIS — E785 Hyperlipidemia, unspecified: Secondary | ICD-10-CM

## 2023-04-26 DIAGNOSIS — I152 Hypertension secondary to endocrine disorders: Secondary | ICD-10-CM

## 2023-04-26 DIAGNOSIS — E1159 Type 2 diabetes mellitus with other circulatory complications: Secondary | ICD-10-CM | POA: Diagnosis not present

## 2023-04-26 DIAGNOSIS — E119 Type 2 diabetes mellitus without complications: Secondary | ICD-10-CM | POA: Diagnosis not present

## 2023-04-26 MED ORDER — LOSARTAN POTASSIUM 50 MG PO TABS: 50.0000 mg | ORAL_TABLET | Freq: Every day | ORAL | 3 refills | Status: DC

## 2023-04-26 NOTE — Patient Instructions (Signed)
Medication Instructions:  Your physician has recommended you make the following change in your medication:   1) INCREASE Losartan to 50 mg daily  *If you need a refill on your cardiac medications before your next appointment, please call your pharmacy*  Lab Work: Next week at any [facility] location: fasting BMP, Lipid panel, LFTs, LP(a). If you have labs (blood work) drawn today and your tests are completely normal, you will receive your results only by: MyChart Message (if you have MyChart) OR A paper copy in the mail If you have any lab test that is abnormal or we need to change your treatment, we will call you to review the results.  Testing/Procedures: None ordered today.  Follow-Up: At [facility], you and your health needs are our priority.  As part of our continuing mission to provide you with exceptional heart care, we have created designated Provider Care Teams.  These Care Teams include your primary Cardiologist (physician) and Advanced Practice Providers (APPs -  Physician Assistants and Nurse Practitioners) who all work together to provide you with the care you need, when you need it.  Your next appointment:   6 month(s)  The format for your next appointment:   In Person  Provider:   Jari Favre, [name], Ronie Spies, [name], Robin Searing, NP, Jacolyn Reedy, [name], Tereso Newcomer, [name], or Perlie Gold, [name]

## 2023-04-27 ENCOUNTER — Telehealth: Payer: Self-pay | Admitting: Internal Medicine

## 2023-04-27 NOTE — Telephone Encounter (Signed)
Pt c/o medication issue:  1. Name of Medication: losartan (COZAAR) 50 MG tablet   2. How are you currently taking this medication (dosage and times per day)?  Take 1 tablet (50 mg total) by mouth daily.      3. Are you having a reaction (difficulty breathing--STAT)? No  4. What is your medication issue? Aimee is requesting a callback regarding the increase on this medication and she stated there 2 meds that aren't in home but was on office summary. Please advise

## 2023-04-27 NOTE — Telephone Encounter (Signed)
Left message for Aimee - [facility] to call back.

## 2023-04-30 ENCOUNTER — Other Ambulatory Visit (HOSPITAL_COMMUNITY): Payer: Self-pay

## 2023-04-30 ENCOUNTER — Telehealth: Payer: Self-pay | Admitting: Internal Medicine

## 2023-04-30 MED ORDER — ROSUVASTATIN CALCIUM 10 MG PO TABS
10.0000 mg | ORAL_TABLET | Freq: Every day | ORAL | 3 refills | Status: DC
  Filled 2023-04-30: qty 90, 90d supply, fill #0

## 2023-04-30 MED ORDER — ROSUVASTATIN CALCIUM 10 MG PO TABS: 10.0000 mg | ORAL_TABLET | Freq: Every day | ORAL | 3 refills | Status: DC

## 2023-04-30 NOTE — Telephone Encounter (Signed)
Called Jerry Moran back about message. Informed her of metoprolol being discontinued. She also wanted to know about lasix and rosuvastatin. Informed her that our office did not prescribe the lasix and to follow-up with patient's PCP. Refilled rosuvastatin. Jerry Moran will call patient's wife and [facility] to discuss.

## 2023-04-30 NOTE — Telephone Encounter (Signed)
Jerry Moran is returning call.

## 2023-04-30 NOTE — Telephone Encounter (Signed)
Pt spouse called in to clarify if Dr. Lynnette Caffey wants pt to stop taking metoprolol. Please advise.

## 2023-04-30 NOTE — Telephone Encounter (Signed)
Called patient's wife, DPR back about message. She wanted to clarify if patient should be taking metoprolol or not. Per last office visit note 04/26/23, Dr. Lynnette Caffey wrote " no need for beta-blocker given preserved LV function." Will send message to Dr. Lynnette Caffey to make sure okay to discontinue metoprolol.

## 2023-04-30 NOTE — Telephone Encounter (Signed)
Per Dr. Lynnette Caffey, patient should not be taking metoprolol. Called patient's wife back to let her know. Patient's wife verbalized understanding. Will update medication list.

## 2023-05-02 ENCOUNTER — Other Ambulatory Visit: Payer: Self-pay | Admitting: Internal Medicine

## 2023-05-03 LAB — BASIC METABOLIC PANEL
BUN/Creatinine Ratio: 10 (ref 10–24)
BUN: 25 mg/dL (ref 8–27)
CO2: 18 mmol/L — ABNORMAL LOW (ref 20–29)
Calcium: 9.5 mg/dL (ref 8.6–10.2)
Chloride: 103 mmol/L (ref 96–106)
Creatinine, Ser: 2.5 mg/dL — ABNORMAL HIGH (ref 0.76–1.27)
Glucose: 161 mg/dL — ABNORMAL HIGH (ref 70–99)
Potassium: 3.9 mmol/L (ref 3.5–5.2)
Sodium: 140 mmol/L (ref 134–144)
eGFR: 27 mL/min/{1.73_m2} — ABNORMAL LOW (ref >59)

## 2023-05-03 LAB — LIPID PANEL
Chol/HDL Ratio: 5.9 {ratio} — ABNORMAL HIGH (ref 0.0–5.0)
Cholesterol, Total: 276 mg/dL — ABNORMAL HIGH (ref 100–199)
HDL: 47 mg/dL (ref >39)
LDL Chol Calc (NIH): 199 mg/dL — ABNORMAL HIGH (ref 0–99)
Triglycerides: 160 mg/dL — ABNORMAL HIGH (ref 0–149)
VLDL Cholesterol Cal: 30 mg/dL (ref 5–40)

## 2023-05-03 LAB — LIPOPROTEIN A (LPA): Lipoprotein (a): 200.8 nmol/L — ABNORMAL HIGH (ref <75.0)

## 2023-05-03 LAB — HEPATIC FUNCTION PANEL
ALT: 13 [IU]/L (ref 0–44)
AST: 18 [IU]/L (ref 0–40)
Albumin: 4 g/dL (ref 3.9–4.9)
Alkaline Phosphatase: 137 [IU]/L — ABNORMAL HIGH (ref 44–121)
Bilirubin Total: 0.6 mg/dL (ref 0.0–1.2)
Bilirubin, Direct: 0.16 mg/dL (ref 0.00–0.40)
Total Protein: 7.2 g/dL (ref 6.0–8.5)

## 2023-05-04 ENCOUNTER — Encounter: Payer: Self-pay | Admitting: Internal Medicine

## 2023-05-22 ENCOUNTER — Telehealth: Payer: Self-pay | Admitting: Internal Medicine

## 2023-05-22 MED ORDER — LABETALOL HCL 100 MG PO TABS: 100.0000 mg | ORAL_TABLET | Freq: Two times a day (BID) | ORAL | 1 refills | Status: DC

## 2023-05-22 NOTE — Telephone Encounter (Signed)
New Message:     Jerry Moran , [facility] from [facility] wants to speak to a nurse, concerning patient's blood pressure readings. She is the home of the patient.     Pt c/o BP issue: STAT if pt c/o blurred vision, one-sided weakness or slurred speech  1. What are your last 5 BP readings? Yesterday after medicine it was 160/90 6:20 P  188/95 today at  7:30 AM it was 168/84 and at 11:40 AM it was 179/85, 2>00 PM  it was 191/88  2. Are you having any other symptoms (ex. Dizziness, headache, blurred vision, passed out)? No symptoms  3. What is your BP issue?  Blood pressure is running high

## 2023-05-22 NOTE — Telephone Encounter (Signed)
Spoke to DOD (Dr. Jacinto Halim) concerning blood pressure readings. Dr. Jacinto Halim advised to order Labetalol 100 mg take one tablet by mouth twice daily. Will have patient scheduled with APP in 1 to 2 weeks. Home Health rep contacted and advised.

## 2023-06-01 ENCOUNTER — Telehealth: Payer: Self-pay

## 2023-06-01 DIAGNOSIS — Z79899 Other long term (current) drug therapy: Secondary | ICD-10-CM

## 2023-06-01 DIAGNOSIS — E785 Hyperlipidemia, unspecified: Secondary | ICD-10-CM

## 2023-06-01 MED ORDER — ROSUVASTATIN CALCIUM 20 MG PO TABS: 20.0000 mg | ORAL_TABLET | Freq: Every day | ORAL | 3 refills | Status: AC

## 2023-06-01 NOTE — Telephone Encounter (Signed)
 Pt's wife Raynelle Fanning returned call to clinic. She is on his DPR and handles his medications. She confirms that he is taking Crestor 10mg  daily and zetia 10mg  daily. Advised her that Lynnette Caffey wants to increase to Crestor 20mg . New prescription sent to pharmacy and labs entered and released. She will make a note to bring him in May for repeat labs.

## 2023-06-01 NOTE — Telephone Encounter (Signed)
-----   Message from Nurse Corky Crafts sent at 05/08/2023  9:24 AM EST -----  ----- Message ----- From: Orbie Pyo, MD Sent: 05/04/2023   6:51 AM EST To: [facility] Triage  Please call patient and make sure he is taking Crestor 10 mg.  If he is not please restart Crestor at 20 mg.  If he is taking it at 10 mg, please increase Crestor to 20 mg and check lipid panel and LFTs in 2 months.

## 2023-06-05 ENCOUNTER — Encounter: Payer: Self-pay | Admitting: Physician Assistant

## 2023-06-05 ENCOUNTER — Ambulatory Visit: Payer: No Typology Code available for payment source | Attending: Physician Assistant | Admitting: Physician Assistant

## 2023-06-05 VITALS — BP 146/82 | HR 72 | Ht 68.0 in | Wt 193.0 lb

## 2023-06-05 DIAGNOSIS — I2510 Atherosclerotic heart disease of native coronary artery without angina pectoris: Secondary | ICD-10-CM

## 2023-06-05 DIAGNOSIS — Z79899 Other long term (current) drug therapy: Secondary | ICD-10-CM

## 2023-06-05 DIAGNOSIS — E1169 Type 2 diabetes mellitus with other specified complication: Secondary | ICD-10-CM

## 2023-06-05 DIAGNOSIS — N1832 Chronic kidney disease, stage 3b: Secondary | ICD-10-CM

## 2023-06-05 DIAGNOSIS — Z6827 Body mass index (BMI) 27.0-27.9, adult: Secondary | ICD-10-CM

## 2023-06-05 DIAGNOSIS — E785 Hyperlipidemia, unspecified: Secondary | ICD-10-CM

## 2023-06-05 DIAGNOSIS — E1159 Type 2 diabetes mellitus with other circulatory complications: Secondary | ICD-10-CM

## 2023-06-05 DIAGNOSIS — E119 Type 2 diabetes mellitus without complications: Secondary | ICD-10-CM

## 2023-06-05 DIAGNOSIS — I152 Hypertension secondary to endocrine disorders: Secondary | ICD-10-CM

## 2023-06-05 NOTE — Progress Notes (Signed)
 Cardiology Office Note:  .   Date:  06/05/2023  ID:  Jerry Moran, DOB 1955/06/24, MRN [id number] PCP: Clinic, [facility] Providers Cardiologist:  Orbie Pyo, MD {  History of Present Illness: .   Jerry Moran is a 68 y.o. male with a past medical history of coronary artery disease, type 2 diabetes mellitus, hypertension, hyperlipidemia, stage IIIb CKD, and elevated BMI (27.0-27.9) here for follow-up appointment.  9/24 patient was seen and had been followed previously by Dr. Katrinka Blazing.  His atorvastatin was stopped by his [facility] provider due to brain fog.  Started on Zetia 10 mg daily.  Stopped taking his Lasix.  Denied any increase in shortness of breath and peripheral edema.  Denied exertional angina.  No recurrent signs and symptoms of stroke.  Had been checking his blood pressure at home and usually elevated between 140s to 160s systolic.  He had been taking his medications faithfully without any missed doses.  Fortunately, has not required any ED visits or hospitalizations recently.  The plan at that time was to stop his beta-blocker given preserved LV function and start losartan and check a BMP in 1 week.  Referred to pharmacy for GLP-1 receptor agonist therapy.  He was then seen in follow-up January 2025 and in the interim had suffered a hip fracture in October and underwent surgery without any complications.  Seen by pharmacy on rosuvastatin was started and Ozempic was recommended.  Patient deferred the Ozempic.  He had been doing well overall.  He had been recuperating from his left hip fracture.  Walking with a cane participating in physical therapy.  He did have a home health nurse that comes and checks on his blood pressure.  Typically it is elevated.  No other cardiovascular symptoms at that time.  Today, he presents for follow-up. He is a 68 year old male with a complex medical history including two strokes, a heart attack, cancer, and a broken hip. He is  currently managing hypertension with medication and takes Lasix as needed. He reports that his blood pressure was slightly elevated at the time of the visit, possibly due to not having taken his medication yet. He also has some kidney disease which is monitored by the [place]. His cholesterol levels are high and he may be on Crestor, but he is unsure. He has diabetes and his blood sugar levels have been around 140-160. He also has some swelling in his feet. He is recovering from hip surgery and uses a cane for mobility. He is not currently participating in physical therapy. He expresses concern about his overall health management and is particularly worried about the risk of falling again.  His last echocardiogram was 2 years ago which showed LVEF 65%, diastolic parameters were indeterminant.  Mild MR.  No other valvular disease.  Normal aorta.  Reports no shortness of breath nor dyspnea on exertion. Reports no chest pain, pressure, or tightness. No orthopnea, PND. Reports no palpitations.   Discussed the use of AI scribe software for clinical note transcription with the patient, who gave verbal consent to proceed.    ROS: Pertinent ROS in HPI  Studies Reviewed: Marland Kitchen        CARDIAC CATHETERIZATION 04/29/2021   Narrative   Dist RCA lesion is 99% stenosed.   1st Mrg-1 lesion is 60% stenosed.   Prox Cx to Mid Cx lesion is 40% stenosed.   1st Mrg-2 lesion is 80% stenosed.   Lat 1st Mrg lesion is 80% stenosed.  Prox LAD to Mid LAD lesion is 50% stenosed.   A drug-eluting stent was successfully placed using a STENT ONYX FRONTIER 2.5X12.   Post intervention, there is a 0% residual stenosis.   The LAD is a large caliber vessel that courses to the apex. The mid LAD has moderate stenosis. RFR 0.92 suggesting this lesion is not flow limiting. The Circumflex is a large vessel. The first obtuse marginal branch is a large bifurcating vessel. The proximal segment of the obtuse marginal branch has a moderate  stenosis. The vessel then bifurcates. There is diffuse severe disease in both distal sub-branches of the obtuse marginal branch. The RCA is a large dominant vessel with severe stenosis just before the bifurcation. This is felt to be the culprit lesion. Successful PTCA/DES x 1 distal RCA Mild elevation LV filling pressures   Recommendations: DAPT with ASA and Brilinta for one year. High intensity statin. Beta blocker as tolerated. Echo is pending. Recheck renal function tomorrow. Medical management of residual CAD in the Circumflex artery. Case reviewed with IC team.   Findings Coronary Findings Diagnostic  Dominance: Right   Left Anterior Descending Prox LAD to Mid LAD lesion is 50% stenosed.   Left Circumflex Vessel is large. Prox Cx to Mid Cx lesion is 40% stenosed.   First Obtuse Marginal Branch Vessel is large in size. 1st Mrg-1 lesion is 60% stenosed. 1st Mrg-2 lesion is 80% stenosed.   Lateral First Obtuse Marginal Branch Lat 1st Mrg lesion is 80% stenosed.   Right Coronary Artery Vessel is large. Dist RCA lesion is 99% stenosed.   Intervention   Dist RCA lesion Stent CATH LAUNCHER 6FR AL.75 guide catheter was inserted. Lesion crossed with guidewire using a WIRE COUGAR XT STRL 190CM. Pre-stent angioplasty was performed using a BALLN SAPPHIRE 2.0X12. A drug-eluting stent was successfully placed using a STENT ONYX FRONTIER 2.5X12. Stent strut is well apposed. Post-stent angioplasty was performed using a BALLN [place] EUPHORA RX 2.75X8. Post-Intervention Lesion Assessment The intervention was successful. Pre-interventional TIMI flow is 3. Post-intervention TIMI flow is 3. No complications occurred at this lesion. There is a 0% residual stenosis post intervention.    ECHOCARDIOGRAM   ECHOCARDIOGRAM COMPLETE 04/29/2021   Narrative ECHOCARDIOGRAM REPORT       Patient Name:   Jerry Moran Date of Exam: 04/29/2021 Medical Rec #:  [id number]         Height:       68.0  in Accession #:    [id number]        Weight:       175.0 lb Date of Birth:  05-12-1955         BSA:          1.931 m Patient Age:    80 years          BP:           176/88 mmHg Patient Gender: M                 HR:           74 bpm. Exam Location:  Inpatient   Procedure: 2D Echo and Strain Analysis   Indications:    acute myocardial infarction   History:        Patient has prior history of Echocardiogram examinations, most recent 01/14/2019. Risk Factors:Dyslipidemia.   Sonographer:    Delcie Roch RDCS Referring Phys: [id number] DENNIS NARCISSE JR   IMPRESSIONS     1. Left ventricular ejection fraction, by estimation,  is 60 to 65%. The left ventricle has normal function. The left ventricle has no regional wall motion abnormalities. Left ventricular diastolic parameters are indeterminate. Elevated left ventricular end-diastolic pressure. 2. Right ventricular systolic function is normal. The right ventricular size is normal. Tricuspid regurgitation signal is inadequate for assessing PA pressure. 3. The mitral valve is normal in structure. Mild mitral valve regurgitation. No evidence of mitral stenosis. 4. The aortic valve is normal in structure. Aortic valve regurgitation is trivial. No aortic stenosis is present. 5. The inferior vena cava is normal in size with greater than 50% respiratory variability, suggesting right atrial pressure of 3 mmHg.   FINDINGS Left Ventricle: Left ventricular ejection fraction, by estimation, is 60 to 65%. The left ventricle has normal function. The left ventricle has no regional wall motion abnormalities. Global longitudinal strain performed but not reported based on interpreter judgement due to suboptimal tracking. The left ventricular internal cavity size was normal in size. There is no left ventricular hypertrophy. Left ventricular diastolic parameters are indeterminate. Elevated left ventricular end-diastolic pressure.   Right Ventricle: The right  ventricular size is normal. No increase in right ventricular wall thickness. Right ventricular systolic function is normal. Tricuspid regurgitation signal is inadequate for assessing PA pressure.   Left Atrium: Left atrial size was normal in size.   Right Atrium: Right atrial size was normal in size.   Pericardium: There is no evidence of pericardial effusion.   Mitral Valve: The mitral valve is normal in structure. Mild mitral valve regurgitation. No evidence of mitral valve stenosis.   Tricuspid Valve: The tricuspid valve is normal in structure. Tricuspid valve regurgitation is trivial. No evidence of tricuspid stenosis.   Aortic Valve: The aortic valve is normal in structure. Aortic valve regurgitation is trivial. No aortic stenosis is present.   Pulmonic Valve: The pulmonic valve was normal in structure. Pulmonic valve regurgitation is trivial. No evidence of pulmonic stenosis.   Aorta: The aortic root is normal in size and structure.   Venous: The inferior vena cava is normal in size with greater than 50% respiratory variability, suggesting right atrial pressure of 3 mmHg.   IAS/Shunts: No atrial level shunt detected by color flow Doppler.       Physical Exam:   VS:  BP (!) 146/82   Pulse 72   Ht 5\' 8"  (1.727 m)   Wt 193 lb (87.5 kg)   SpO2 97%   BMI 29.35 kg/m    Wt Readings from Last 3 Encounters:  06/05/23 193 lb (87.5 kg)  01/24/23 190 lb (86.2 kg)  12/22/22 187 lb (84.8 kg)    GEN: Well nourished, well developed in no acute distress NECK: No JVD; No carotid bruits CARDIAC: RRR, no murmurs, rubs, gallops RESPIRATORY:  Clear to auscultation without rales, wheezing or rhonchi  ABDOMEN: Soft, non-tender, non-distended EXTREMITIES:  1-2+ pitting edema; No deformity   ASSESSMENT AND PLAN: .   CAD -he is unsure what medications he is taking because his wife takes care of it and she is not at his appointment -I have asked him to have her send me a mychart message with  current medications -no chest pain or SOB -he takes longer to do things due to his hip and the pain, current in PT (last week)  Hypertension Elevated blood pressure readings, possibly due to inconsistent timing of medication intake. Currently on Amlodipine 10mg  daily and Losartan 50mg  daily. -Continue current medications. -Check blood pressure at home 1 hour after taking medication for  2 weeks and report values.  Post Hip Surgery Slow recovery with fear of falling. Currently using a cane for mobility. -Continue with slow, cautious mobility to prevent falls.  Diabetes Recent blood glucose readings of 141 and 116. Last A1c was high at 10. -Continue current management and monitor blood glucose levels.  Hyperlipidemia High LDL cholesterol level of 130 in January. Uncertain if currently taking Rosuvastatin (Crestor) 20mg  daily. -Confirm current medication use. -Consider increasing Rosuvastatin to 40mg  daily if currently taking 20mg . -Consider consultation with clinical pharmacist for medication review.  Chronic Kidney Disease Managed through the [place]. -Continue current management through the [place]. -last creatinine 2.5  Peripheral Neuropathy Likely due to diabetes. -Continue current management.  Lower Extremity Edema Likely due to kidney disease. -Encourage elevation of feet and use of compression stockings if possible.  General Health Maintenance / Followup Plans -Bring paperwork from this appointment to next [facility] appointment in April. -Consider rechecking labs at next [facility] appointment.       Dispo: He can follow-up in 3 to 4 months with Dr. Lynnette Caffey  Signed, Sharlene Dory, [name]

## 2023-06-05 NOTE — Patient Instructions (Signed)
 Medication Instructions:  Your physician recommends that you continue on your current medications as directed. Please refer to the Current Medication list given to you today.  CAN YOU HAVE YOUR WIFE SEND A MESSAGE TO [place] IN [facility] WITH WHAT MEDICATIONS YOU ARE TAKING.  *If you need a refill on your cardiac medications before your next appointment, please call your pharmacy*   Lab Work: None today, at your next appointment we will get some lab work.  If you have labs (blood work) drawn today and your tests are completely normal, you will receive your results only by: MyChart Message (if you have MyChart) OR A paper copy in the mail If you have any lab test that is abnormal or we need to change your treatment, we will call you to review the results.   Testing/Procedures: None    Follow-Up: At [facility], you and your health needs are our priority.  As part of our continuing mission to provide you with exceptional heart care, we have created designated Provider Care Teams.  These Care Teams include your primary Cardiologist (physician) and Advanced Practice Providers (APPs -  Physician Assistants and Nurse Practitioners) who all work together to provide you with the care you need, when you need it.  We recommend signing up for the patient portal called "MyChart".  Sign up information is provided on this After Visit Summary.  MyChart is used to connect with patients for Virtual Visits (Telemedicine).  Patients are able to view lab/test results, encounter notes, upcoming appointments, etc.  Non-urgent messages can be sent to your provider as well.   To learn more about what you can do with MyChart, go to [email or web address].    Your next appointment:   3-4 month(s)  Provider:   Orbie Pyo, MD     Other Instructions   .  1st Floor: - Lobby - Registration  - Pharmacy  - Lab - Cafe  2nd Floor: - PV Lab - Diagnostic Testing (echo, CT, nuclear med)  3rd  Floor: - Vacant  4th Floor: - TCTS (cardiothoracic surgery) - AFib Clinic - Structural Heart Clinic - Vascular Surgery  - Vascular Ultrasound  5th Floor: - HeartCare Cardiology (general and EP) - Clinical Pharmacy for coumadin, hypertension, lipid, weight-loss medications, and med management appointments    Valet parking services will be available as well.

## 2023-09-24 ENCOUNTER — Telehealth: Payer: Self-pay | Admitting: Internal Medicine

## 2023-09-24 NOTE — Telephone Encounter (Signed)
 Pts wife called to report that the pt saw his PCP at the [facility] this past Friday and they recorded his BP as being 144/77 and 142/76... he has not been checking his BP at home so they did not have any readings to give me.. they asked him to call Cardiology to see if he needs to increase his Labetalol .   I asked her to try to check his BP at once a day about 2 hours after taking his meds.. write them down and let us  know in a few days what his BP is running.  They will call in a few days and let us  know.

## 2023-09-24 NOTE — Telephone Encounter (Signed)
 Pt c/o medication issue:  1. Name of Medication:  labetalol  (NORMODYNE ) 100 MG tablet    2. How are you currently taking this medication (dosage and times per day)?   3. Are you having a reaction (difficulty breathing--STAT)?   4. What is your medication issue?   Wife would like to know if Labetalol  needs to be increased. Please advise.

## 2023-09-26 NOTE — Telephone Encounter (Signed)
 Wife said pt's BP was 130/70 after he took his medication

## 2023-10-01 ENCOUNTER — Telehealth: Payer: Self-pay | Admitting: Internal Medicine

## 2023-10-01 NOTE — Telephone Encounter (Signed)
 Pt c/o BP issue: STAT if pt c/o blurred vision, one-sided weakness or slurred speech.  1. What is your BP concern?  Wife calling to report BP readings   2. Have you taken any BP medication today? Yes   3. What are your last 5 BP readings? 6/27: 128/77 6/29: 137/79 6/30: 152/76  4. Are you having any other symptoms (ex. Dizziness, headache, blurred vision, passed out)?  No

## 2023-10-02 NOTE — Telephone Encounter (Signed)
 BP readings appear to be normalizing.  Continue current medications as prescribed and continue to monitor blood pressures with goal of 130/80.   Jackee Alberts, NP _______________________________________ Beatris w patient's wife:  today's reading 118/70.  This was before the medication.

## 2023-10-02 NOTE — Telephone Encounter (Signed)
 Left message to call back

## 2023-12-17 ENCOUNTER — Telehealth: Payer: Self-pay | Admitting: Internal Medicine

## 2023-12-17 ENCOUNTER — Other Ambulatory Visit: Payer: Self-pay | Admitting: Internal Medicine

## 2023-12-17 NOTE — Telephone Encounter (Signed)
 Pt went to [facility] this morning and they could not see lab. Please advise

## 2023-12-17 NOTE — Telephone Encounter (Signed)
 Spoke with pt's wife per [facility] regarding lab orders. Wife stated the pt went to [facility] this morning and was told there were no labs released to draw. There were two active lab orders in the chart. Lab requisition orders printed off and will be left at the Coumadin clinic for the pt to pick up. Wife aware. Wife verbalized understanding. All questions if any were answered.

## 2023-12-18 ENCOUNTER — Ambulatory Visit: Payer: Self-pay | Admitting: Internal Medicine

## 2023-12-18 LAB — LIPID PANEL
Chol/HDL Ratio: 3.2 ratio (ref 0.0–5.0)
Cholesterol, Total: 115 mg/dL (ref 100–199)
HDL: 36 mg/dL — ABNORMAL LOW (ref >39)
LDL Chol Calc (NIH): 56 mg/dL (ref 0–99)
Triglycerides: 132 mg/dL (ref 0–149)
VLDL Cholesterol Cal: 23 mg/dL (ref 5–40)

## 2023-12-18 LAB — HEPATIC FUNCTION PANEL
ALT: 14 IU/L (ref 0–44)
AST: 18 IU/L (ref 0–40)
Albumin: 4.1 g/dL (ref 3.9–4.9)
Alkaline Phosphatase: 97 IU/L (ref 47–123)
Bilirubin Total: 0.4 mg/dL (ref 0.0–1.2)
Bilirubin, Direct: 0.1 mg/dL (ref 0.00–0.40)
Total Protein: 7.1 g/dL (ref 6.0–8.5)

## 2024-01-01 ENCOUNTER — Other Ambulatory Visit: Payer: Self-pay | Admitting: Cardiovascular Disease

## 2024-01-01 DIAGNOSIS — Z0000 Encounter for general adult medical examination without abnormal findings: Secondary | ICD-10-CM

## 2024-01-09 ENCOUNTER — Telehealth: Payer: Self-pay | Admitting: Internal Medicine

## 2024-01-09 NOTE — Telephone Encounter (Signed)
 Informed her that heart attack was 04/28/2021 (NSTEMI)  She reports that she just needed this information for some [facility] benefit paperwork.

## 2024-01-09 NOTE — Telephone Encounter (Signed)
 Pt's spouse is requesting the date of her husband heart attack please Advise

## 2024-01-16 ENCOUNTER — Ambulatory Visit (INDEPENDENT_AMBULATORY_CARE_PROVIDER_SITE_OTHER): Payer: PRIVATE HEALTH INSURANCE

## 2024-01-16 DIAGNOSIS — Z0000 Encounter for general adult medical examination without abnormal findings: Secondary | ICD-10-CM

## 2024-02-05 ENCOUNTER — Telehealth: Payer: Self-pay | Admitting: Internal Medicine

## 2024-02-05 NOTE — Telephone Encounter (Signed)
 Pt c/o medication issue:  1. Name of Medication: losartan  (COZAAR ) 50 MG tablet   2. How are you currently taking this medication (dosage and times per day)?   As prescribed  3. Are you having a reaction (difficulty breathing--STAT)?   4. What is your medication issue?   Wife Ilah) called to report that patient's [facility] nephrologist stated his kidneys are getting worse and have taken him off this medication.

## 2024-02-05 NOTE — Telephone Encounter (Signed)
 Spoke with pt's wife and explained we will forward this information to Dr. Wendel.

## 2024-02-06 NOTE — Telephone Encounter (Signed)
 LVM for pt's wife to let her know that we will pass this information along to Dr. Wendel.

## 2024-02-06 NOTE — Telephone Encounter (Signed)
 Pt c/o medication issue:  1. Name of Medication: labetalol  (NORMODYNE ) 100 MG tablet   2. How are you currently taking this medication (dosage and times per day)? N/A  3. Are you having a reaction (difficulty breathing--STAT)? No  4. What is your medication issue? Pts wife states his Neurologist would like him to take this medication three times daily.

## 2024-03-01 ENCOUNTER — Encounter (HOSPITAL_COMMUNITY): Payer: Self-pay | Admitting: *Deleted

## 2024-03-01 ENCOUNTER — Emergency Department (HOSPITAL_COMMUNITY): Admission: EM | Admit: 2024-03-01 | Discharge: 2024-03-01 | Disposition: A | Discharge status: Home / Self Care

## 2024-03-01 ENCOUNTER — Other Ambulatory Visit: Payer: Self-pay

## 2024-03-01 ENCOUNTER — Emergency Department (HOSPITAL_COMMUNITY)

## 2024-03-01 DIAGNOSIS — R0789 Other chest pain: Secondary | ICD-10-CM | POA: Insufficient documentation

## 2024-03-01 DIAGNOSIS — M25551 Pain in right hip: Secondary | ICD-10-CM | POA: Insufficient documentation

## 2024-03-01 DIAGNOSIS — W1839XA Other fall on same level, initial encounter: Secondary | ICD-10-CM | POA: Insufficient documentation

## 2024-03-01 DIAGNOSIS — W19XXXA Unspecified fall, initial encounter: Secondary | ICD-10-CM

## 2024-03-01 DIAGNOSIS — M25511 Pain in right shoulder: Secondary | ICD-10-CM | POA: Insufficient documentation

## 2024-03-01 MED ORDER — LIDOCAINE 5 % EX PTCH: 1.0000 | MEDICATED_PATCH | CUTANEOUS | 0 refills | Status: AC

## 2024-03-01 MED ORDER — ACETAMINOPHEN 325 MG PO TABS
650.0000 mg | ORAL_TABLET | Freq: Once | ORAL | Status: AC
  Administered 2024-03-01: 650 mg via ORAL
  Filled 2024-03-01: qty 2

## 2024-03-01 NOTE — ED Provider Triage Note (Signed)
 Emergency Medicine Provider Triage Evaluation Note  Jerry Moran , a 68 y.o. male  was evaluated in triage.  Pt complains of pain after a fall occurring yesterday.  Patient [place] train.  He was wearing a backpack and he fell backwards.  Patient has pain in the anterior right shoulder, right hip.  History of previous left hip replacement.  He is acting at baseline per family member at bedside.  Review of Systems  Positive: Shoulder pain, hip pain Negative: Headache, vomiting  Physical Exam  BP (!) 152/77 (BP Location: Right Arm)   Pulse 69   Temp 98.3 F (36.8 C) (Oral)   Resp 18   Ht 5' 8 (1.727 m)   Wt 82.1 kg   SpO2 98%   BMI 27.52 kg/m  Gen:   Awake, no distress   Resp:  Normal effort  MSK:   Tenderness to palpation anterior right shoulder, patient is able to lift right arm above shoulder height, he is able to flex right hip while sitting in exam chair Other:    Medical Decision Making  Medically screening exam initiated at 1:34 PM.  Appropriate orders placed.  Dani Tippin was informed that the remainder of the evaluation will be completed by another provider, this initial triage assessment does not replace that evaluation, and the importance of remaining in the ED until their evaluation is complete.  X-rays ordered.  Low concern for significant head or neck injury at this time.   Desiderio Chew, [name] 03/01/24 1336

## 2024-03-01 NOTE — ED Notes (Signed)
 Explained and had pt demonstrate incentive spriometer

## 2024-03-01 NOTE — ED Provider Notes (Signed)
 68 yo male here with rib pain, pending xray Mechanical fall  Physical Exam  BP (!) 152/77 (BP Location: Right Arm)   Pulse 69   Temp 98.3 F (36.8 C) (Oral)   Resp 18   Ht 5' 8 (1.727 m)   Wt 82.1 kg   SpO2 98%   BMI 27.52 kg/m   Physical Exam  Procedures  Procedures  ED Course / MDM    Medical Decision Making Amount and/or Complexity of Data Reviewed Radiology: ordered.  Risk OTC drugs. Prescription drug management.   X-rays reviewed.  No emergent findings.  I reassessed the patient continues have some right-sided rib soreness.  We discussed presumed a possible rib fracture versus contusion.  He can follow-up with his PCP.  We discussed incentive spirometer, not binding his chest, use of Tylenol  and lidocaine  patches for pain at home.  He is quite comfortable with this plan.  His wife is here to help take him home.       Cottie Donnice PARAS, MD 03/01/24 [phone]
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
## 2024-03-01 NOTE — Discharge Instructions (Addendum)
 You can use Tylenol  as needed for pain.  Follow-up with your primary care doctor in 1 to 2 weeks.  Use your incentive spirometer every 1-2 hours while awake for the next 7 days.  You should take 10 slow breaths at a time each time you use it.  Do not wrap or bind your chest.

## 2024-05-07 ENCOUNTER — Ambulatory Visit: Admitting: Internal Medicine

## 2024-05-07 ENCOUNTER — Encounter: Payer: Self-pay | Admitting: Internal Medicine

## 2024-05-07 VITALS — BP 140/80 | HR 63 | Ht 68.0 in | Wt 184.0 lb

## 2024-05-07 DIAGNOSIS — E1169 Type 2 diabetes mellitus with other specified complication: Secondary | ICD-10-CM | POA: Diagnosis not present

## 2024-05-07 DIAGNOSIS — E1159 Type 2 diabetes mellitus with other circulatory complications: Secondary | ICD-10-CM | POA: Diagnosis not present

## 2024-05-07 DIAGNOSIS — E785 Hyperlipidemia, unspecified: Secondary | ICD-10-CM | POA: Diagnosis not present

## 2024-05-07 DIAGNOSIS — Z794 Long term (current) use of insulin: Secondary | ICD-10-CM

## 2024-05-07 DIAGNOSIS — N1832 Chronic kidney disease, stage 3b: Secondary | ICD-10-CM

## 2024-05-07 DIAGNOSIS — I249 Acute ischemic heart disease, unspecified: Secondary | ICD-10-CM | POA: Diagnosis not present

## 2024-05-07 DIAGNOSIS — E118 Type 2 diabetes mellitus with unspecified complications: Secondary | ICD-10-CM

## 2024-05-07 DIAGNOSIS — I152 Hypertension secondary to endocrine disorders: Secondary | ICD-10-CM

## 2024-05-07 DIAGNOSIS — Z6827 Body mass index (BMI) 27.0-27.9, adult: Secondary | ICD-10-CM

## 2024-05-07 MED ORDER — LABETALOL HCL 200 MG PO TABS: 200.0000 mg | ORAL_TABLET | Freq: Two times a day (BID) | ORAL | 3 refills | Status: AC

## 2024-05-07 NOTE — Patient Instructions (Signed)
 Medication Instructions:  Meds ordered this encounter  Medications   labetalol  (NORMODYNE ) 200 MG tablet    Sig: Take 1 tablet (200 mg total) by mouth 2 (two) times daily.    Dispense:  180 tablet    Refill:  3     Medications Discontinued During This Encounter  Medication Reason   losartan  (COZAAR ) 50 MG tablet Completed Course   labetalol  (NORMODYNE ) 100 MG tablet Reorder    *If you need a refill on your cardiac medications before your next appointment, please call your pharmacy*   Follow-Up: At [facility], you and your health needs are our priority.  As part of our continuing mission to provide you with exceptional heart care, our providers are all part of one team.  This team includes your primary Cardiologist (physician) and Advanced Practice Providers or APPs (Physician Assistants and Nurse Practitioners) who all work together to provide you with the care you need, when you need it.  Your next appointment:   6 month(s)  Provider:   Glendia Ferrier, [name]      We recommend signing up for the patient portal called MyChart.  Sign up information is provided on this After Visit Summary.  MyChart is used to connect with patients for Virtual Visits (Telemedicine).  Patients are able to view lab/test results, encounter notes, upcoming appointments, etc.  Non-urgent messages can be sent to your provider as well.   To learn more about what you can do with MyChart, go to [email or web address].   Other Instructions
